# Patient Record
Sex: Female | Born: 1965
Health system: Southern US, Community
[De-identification: ages and names within clinical notes are randomized; demographics above are authoritative.]

## PROBLEM LIST (undated history)

## (undated) DIAGNOSIS — F909 Attention-deficit hyperactivity disorder, unspecified type: Secondary | ICD-10-CM

## (undated) DIAGNOSIS — R079 Chest pain, unspecified: Secondary | ICD-10-CM

## (undated) DIAGNOSIS — I10 Essential (primary) hypertension: Secondary | ICD-10-CM

## (undated) DIAGNOSIS — D693 Immune thrombocytopenic purpura: Secondary | ICD-10-CM

## (undated) DIAGNOSIS — M79673 Pain in unspecified foot: Secondary | ICD-10-CM

## (undated) DIAGNOSIS — R9082 White matter disease, unspecified: Secondary | ICD-10-CM

## (undated) DIAGNOSIS — R41840 Attention and concentration deficit: Secondary | ICD-10-CM

## (undated) DIAGNOSIS — F09 Unspecified mental disorder due to known physiological condition: Secondary | ICD-10-CM

## (undated) DIAGNOSIS — J189 Pneumonia, unspecified organism: Secondary | ICD-10-CM

## (undated) DIAGNOSIS — R519 Headache, unspecified: Secondary | ICD-10-CM

## (undated) DIAGNOSIS — Z13220 Encounter for screening for lipoid disorders: Secondary | ICD-10-CM

## (undated) DIAGNOSIS — M1990 Unspecified osteoarthritis, unspecified site: Secondary | ICD-10-CM

## (undated) DIAGNOSIS — M21619 Bunion of unspecified foot: Secondary | ICD-10-CM

## (undated) DIAGNOSIS — H532 Diplopia: Secondary | ICD-10-CM

## (undated) DIAGNOSIS — G35 Multiple sclerosis: Secondary | ICD-10-CM

## (undated) DIAGNOSIS — S329XXA Fracture of unspecified parts of lumbosacral spine and pelvis, initial encounter for closed fracture: Secondary | ICD-10-CM

## (undated) DIAGNOSIS — E0500 Thyrotoxicosis with diffuse goiter without thyrotoxic crisis or storm: Secondary | ICD-10-CM

## (undated) DIAGNOSIS — R51 Headache: Secondary | ICD-10-CM

## (undated) DIAGNOSIS — R413 Other amnesia: Secondary | ICD-10-CM

## (undated) DIAGNOSIS — R260 Ataxic gait: Secondary | ICD-10-CM

## (undated) DIAGNOSIS — D759 Disease of blood and blood-forming organs, unspecified: Secondary | ICD-10-CM

## (undated) DIAGNOSIS — D696 Thrombocytopenia, unspecified: Secondary | ICD-10-CM

## (undated) DIAGNOSIS — E039 Hypothyroidism, unspecified: Secondary | ICD-10-CM

## (undated) DIAGNOSIS — R200 Anesthesia of skin: Secondary | ICD-10-CM

## (undated) HISTORY — PX: FOOT SURGERY: SHX648

## (undated) HISTORY — PX: OTHER SURGICAL HISTORY: SHX169

## (undated) HISTORY — PX: WISDOM TOOTH EXTRACTION: SHX21

## (undated) HISTORY — DX: Pain in unspecified foot: M79.673

## (undated) HISTORY — DX: Bunion of unspecified foot: M21.619

## (undated) HISTORY — DX: Essential (primary) hypertension: I10

## (undated) HISTORY — DX: Thyrotoxicosis with diffuse goiter without thyrotoxic crisis or storm: E05.00

---
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
## 2011-08-23 ENCOUNTER — Emergency Department (HOSPITAL_COMMUNITY): Payer: Worker's Compensation

## 2011-08-23 ENCOUNTER — Ambulatory Visit (HOSPITAL_COMMUNITY)
Admission: EM | Admit: 2011-08-23 | Discharge: 2011-08-25 | DRG: 536 | Disposition: A | Discharge status: Home Health | Payer: Worker's Compensation | Attending: Orthopedic Surgery | Admitting: Orthopedic Surgery

## 2011-08-23 DIAGNOSIS — W010XXA Fall on same level from slipping, tripping and stumbling without subsequent striking against object, initial encounter: Secondary | ICD-10-CM | POA: Insufficient documentation

## 2011-08-23 DIAGNOSIS — Y92009 Unspecified place in unspecified non-institutional (private) residence as the place of occurrence of the external cause: Secondary | ICD-10-CM | POA: Insufficient documentation

## 2011-08-23 DIAGNOSIS — Y998 Other external cause status: Secondary | ICD-10-CM | POA: Insufficient documentation

## 2011-08-23 DIAGNOSIS — M25559 Pain in unspecified hip: Secondary | ICD-10-CM | POA: Insufficient documentation

## 2011-08-23 DIAGNOSIS — S32409A Unspecified fracture of unspecified acetabulum, initial encounter for closed fracture: Secondary | ICD-10-CM | POA: Insufficient documentation

## 2011-08-23 LAB — POCT PREGNANCY, URINE: Preg Test, Ur: NEGATIVE

## 2011-08-29 NOTE — Discharge Summary (Signed)
  NAME:  Ashley Key, Ashley Key.                ACCOUNT NO.:  [id number]  MEDICAL RECORD NO.:  [id number]  LOCATION:  5031                         FACILITY:  [facility]  PHYSICIAN:  Nadara Mustard, MD     DATE OF BIRTH:  [birth year]  DATE OF ADMISSION:  08/23/2011 DATE OF DISCHARGE:  08/25/2011                              DISCHARGE SUMMARY   FINAL DIAGNOSIS:  Left acetabular fracture.  Discharged to home in stable condition.  DISCHARGE MEDICATIONS:  Include her admission medications as per the medical reconciliation form plus Percocet for pain 1-2 p.o. q.4 h. p.r.n. for pain, prescription provided.  The patient will also take 1 baby aspirin a day for 4 weeks for DVT prophylaxis.  Physical therapy with [facility] as well as DME including shower, chair, and walker.  Follow up in the office in 1 week.  Discharged to home in stable condition.     Nadara Mustard, MD     MVD/MEDQ  D:  08/25/2011  T:  08/25/2011  Job:  [id number]  Electronically Signed by Aldean Baker MD on 08/29/2011 06:23:32 AM

## 2013-09-02 ENCOUNTER — Encounter: Payer: Self-pay | Admitting: *Deleted

## 2013-09-10 ENCOUNTER — Ambulatory Visit (INDEPENDENT_AMBULATORY_CARE_PROVIDER_SITE_OTHER): Payer: BC Managed Care – PPO

## 2013-09-10 VITALS — BP 144/94 | HR 54 | Temp 98.0°F | Resp 12 | Ht 66.0 in | Wt 132.0 lb

## 2013-09-10 DIAGNOSIS — Z9889 Other specified postprocedural states: Secondary | ICD-10-CM

## 2013-09-10 DIAGNOSIS — M2450 Contracture, unspecified joint: Secondary | ICD-10-CM

## 2013-09-10 NOTE — Progress Notes (Signed)
  Subjective:    Patient ID: Ashley Key. [place], female    DOB: 06-14-1966, 47 y.o.   MRN: [id number]  HPI patient presents for 3 month postop visit status post [facility] bunionectomy right foot as well as tailor bunionectomy with screw fixation left foot continues to have some slight dorsal contracture at the MTP level has excellent dorsiflexion range of motion however limited plantar flexion is noted. X-rays taken at this time revealed good consolidation of the osteotomy and intact screw fixations with no displacements. Both clinically and radiographically there is still some mild postoperative edema consistent with postop course    Review of Systems  Constitutional: Negative.   HENT: Negative.   Respiratory: Negative.   Cardiovascular: Negative.   Gastrointestinal: Negative.   Genitourinary: Negative.   Allergic/Immunologic: Negative.   Neurological: Negative.   Hematological: Negative.   Psychiatric/Behavioral: Negative.        Objective:   Physical Exam  Constitutional: She is oriented to person, place, and time. She appears well-developed and well-nourished.  Cardiovascular: Intact distal pulses.   Pulses:      Dorsalis pedis pulses are 2+ on the right side, and 2+ on the left side.       Posterior tibial pulses are 2+ on the right side, and 2+ on the left side.  Capillary refill timed 3-4 seconds all digits skin texture and turgor normal. Mild edema to the operating foot in the area of first and fifth metatarsals consistent with postop course.  Musculoskeletal:       Left foot: Normal.  Patient continues to have limited motion at the first MTP joint, excellent dorsiflexion is noted however there is rigid contracture and plantar flexions attempt. Minimal toe purchase is noted. Incisions are well coapted with mild scar contracture being noted X-rays confirm good alignment of the osteotomies and intact fixations. Mild asymmetric joint space narrowing of the first MTP joint is still present,  however clinically movement has greatly improved.  Neurological: She is alert and oriented to person, place, and time. She has normal reflexes.  Skin: Skin is warm and dry.  Skin texture and turgor unremarkable well-healed incision scars noted  Psychiatric: She has a normal mood and affect. Her behavior is normal. Judgment and thought content normal.          Assessment & Plan:  Good postop progress patient continues to have some mild contracture at the MTP joint right. Would benefit from continued active and passive range of motion exercises, also dispensed a Darco toe splint to help plantar flex the right great toe. Maintain and steadily increase activities, followup in 3 months for reevaluation.  Alvan Dame DPM

## 2013-09-10 NOTE — Patient Instructions (Signed)
Refer to handout for postop bunion exercises Stress plantar flexion or and down movement of the great toe joint maintained exercises for the next 3 months. Maintain Darco toe splint while sleeping to help improve range of motion of grade 2 joint

## 2013-12-14 ENCOUNTER — Ambulatory Visit (INDEPENDENT_AMBULATORY_CARE_PROVIDER_SITE_OTHER): Payer: BC Managed Care – PPO

## 2013-12-14 VITALS — BP 130/84 | HR 80 | Resp 12

## 2013-12-14 DIAGNOSIS — R52 Pain, unspecified: Secondary | ICD-10-CM

## 2013-12-14 DIAGNOSIS — M2450 Contracture, unspecified joint: Secondary | ICD-10-CM

## 2013-12-14 DIAGNOSIS — Z9889 Other specified postprocedural states: Secondary | ICD-10-CM

## 2013-12-14 NOTE — Progress Notes (Signed)
   Subjective:    Patient ID: Ashley Key, female    DOB: 1966/08/15, 48 y.o.   MRN: [id number]  HPI Comments: '' RT FOOT STILL SORE WHEN BENDING IT DOWN.''     Review of Systems no new change     Objective:   Physical Exam Neurovascular status is intact incision is well coapted. X-rays revealed alignment of the fixations and osteotomies which are healed well. There is still since reduced range of motion plantar flexion at the hallux MTP joint. Advised to continue with active passive range of motion exercises and stretching exercises. Continue with vitamin E or lotion to the incision scars which are well healed at this time the x-rays reveal good consolidation of osteotomies no displacement of fixation       Assessment & Plan:  Good postop progress. Patient is status post [facility] [place] as will tailor bunionectomy right foot incision is well coapted bone as well healed or displacements noted continue with active and passive range of motion exercises. Discharge to an as-needed basis for followup  Harriet Masson DPM

## 2013-12-14 NOTE — Patient Instructions (Signed)
Continued active range of motion exercises of the great toe joint in particular: The toe down as much as possible maintain lotion to the scar area as needed next  Discharge to an as-needed basis for followup

## 2014-01-17 ENCOUNTER — Other Ambulatory Visit (HOSPITAL_COMMUNITY)
Admission: RE | Admit: 2014-01-17 | Discharge: 2014-01-17 | Disposition: A | Discharge status: Home / Self Care | Payer: BC Managed Care – PPO | Source: Ambulatory Visit | Attending: Obstetrics and Gynecology | Admitting: Obstetrics and Gynecology

## 2014-01-17 ENCOUNTER — Other Ambulatory Visit: Payer: Self-pay | Admitting: Obstetrics and Gynecology

## 2014-01-17 DIAGNOSIS — Z1151 Encounter for screening for human papillomavirus (HPV): Secondary | ICD-10-CM | POA: Insufficient documentation

## 2014-01-17 DIAGNOSIS — Z01419 Encounter for gynecological examination (general) (routine) without abnormal findings: Secondary | ICD-10-CM | POA: Insufficient documentation

## 2014-06-27 ENCOUNTER — Encounter (HOSPITAL_COMMUNITY): Payer: Self-pay | Admitting: Emergency Medicine

## 2014-06-27 ENCOUNTER — Inpatient Hospital Stay (HOSPITAL_COMMUNITY)
Admission: EM | Admit: 2014-06-27 | Discharge: 2014-06-29 | DRG: 813 | Disposition: A | Discharge status: Home / Self Care | Payer: BC Managed Care – PPO | Attending: Internal Medicine | Admitting: Internal Medicine

## 2014-06-27 DIAGNOSIS — D693 Immune thrombocytopenic purpura: Secondary | ICD-10-CM

## 2014-06-27 DIAGNOSIS — R946 Abnormal results of thyroid function studies: Secondary | ICD-10-CM | POA: Diagnosis present

## 2014-06-27 DIAGNOSIS — F909 Attention-deficit hyperactivity disorder, unspecified type: Secondary | ICD-10-CM | POA: Diagnosis present

## 2014-06-27 DIAGNOSIS — Z823 Family history of stroke: Secondary | ICD-10-CM

## 2014-06-27 DIAGNOSIS — F341 Dysthymic disorder: Secondary | ICD-10-CM | POA: Diagnosis present

## 2014-06-27 DIAGNOSIS — D709 Neutropenia, unspecified: Secondary | ICD-10-CM

## 2014-06-27 DIAGNOSIS — D72819 Decreased white blood cell count, unspecified: Secondary | ICD-10-CM

## 2014-06-27 DIAGNOSIS — IMO0002 Reserved for concepts with insufficient information to code with codable children: Secondary | ICD-10-CM

## 2014-06-27 DIAGNOSIS — N920 Excessive and frequent menstruation with regular cycle: Secondary | ICD-10-CM

## 2014-06-27 DIAGNOSIS — R599 Enlarged lymph nodes, unspecified: Secondary | ICD-10-CM | POA: Diagnosis present

## 2014-06-27 DIAGNOSIS — Z79899 Other long term (current) drug therapy: Secondary | ICD-10-CM

## 2014-06-27 DIAGNOSIS — D696 Thrombocytopenia, unspecified: Secondary | ICD-10-CM | POA: Diagnosis present

## 2014-06-27 DIAGNOSIS — Z8249 Family history of ischemic heart disease and other diseases of the circulatory system: Secondary | ICD-10-CM

## 2014-06-27 DIAGNOSIS — N959 Unspecified menopausal and perimenopausal disorder: Secondary | ICD-10-CM | POA: Diagnosis present

## 2014-06-27 HISTORY — DX: Thrombocytopenia, unspecified: D69.6

## 2014-06-27 HISTORY — DX: Immune thrombocytopenic purpura: D69.3

## 2014-06-27 HISTORY — DX: Attention-deficit hyperactivity disorder, unspecified type: F90.9

## 2014-06-27 LAB — CBC WITH DIFFERENTIAL/PLATELET
Basophils Absolute: 0 10*3/uL (ref 0.0–0.1)
Basophils Relative: 1 % (ref 0–1)
Eosinophils Absolute: 0.1 10*3/uL (ref 0.0–0.7)
Eosinophils Relative: 3 % (ref 0–5)
HCT: 38.7 % (ref 36.0–46.0)
Hemoglobin: 13.1 g/dL (ref 12.0–15.0)
LYMPHS ABS: 1.2 10*3/uL (ref 0.7–4.0)
LYMPHS PCT: 38 % (ref 12–46)
MCH: 30.3 pg (ref 26.0–34.0)
MCHC: 33.9 g/dL (ref 30.0–36.0)
MCV: 89.4 fL (ref 78.0–100.0)
Monocytes Absolute: 0.4 10*3/uL (ref 0.1–1.0)
Monocytes Relative: 14 % — ABNORMAL HIGH (ref 3–12)
NEUTROS ABS: 1.4 10*3/uL — AB (ref 1.7–7.7)
NEUTROS PCT: 45 % (ref 43–77)
PLATELETS: 28 10*3/uL — AB (ref 150–400)
RBC: 4.33 MIL/uL (ref 3.87–5.11)
RDW: 11.9 % (ref 11.5–15.5)
WBC: 3.1 10*3/uL — AB (ref 4.0–10.5)

## 2014-06-27 LAB — COMPREHENSIVE METABOLIC PANEL
ALK PHOS: 45 U/L (ref 39–117)
ALT: 8 U/L (ref 0–35)
AST: 17 U/L (ref 0–37)
Albumin: 3.8 g/dL (ref 3.5–5.2)
Anion gap: 12 (ref 5–15)
BUN: 17 mg/dL (ref 6–23)
CHLORIDE: 104 meq/L (ref 96–112)
CO2: 25 meq/L (ref 19–32)
Calcium: 8.5 mg/dL (ref 8.4–10.5)
Creatinine, Ser: 0.71 mg/dL (ref 0.50–1.10)
GLUCOSE: 86 mg/dL (ref 70–99)
POTASSIUM: 4.4 meq/L (ref 3.7–5.3)
SODIUM: 141 meq/L (ref 137–147)
Total Bilirubin: <0.2 mg/dL — ABNORMAL LOW (ref 0.3–1.2)
Total Protein: 6.9 g/dL (ref 6.0–8.3)

## 2014-06-27 LAB — SAVE SMEAR

## 2014-06-27 LAB — APTT: aPTT: 27 seconds (ref 24–37)

## 2014-06-27 LAB — PROTIME-INR
INR: 1.1 (ref 0.00–1.49)
Prothrombin Time: 14.2 seconds (ref 11.6–15.2)

## 2014-06-27 MED ORDER — SODIUM CHLORIDE 0.9 % IV SOLN
INTRAVENOUS | Status: DC
  Administered 2014-06-28: 01:00:00 via INTRAVENOUS

## 2014-06-27 MED ORDER — ONDANSETRON HCL 4 MG PO TABS: 4.0000 mg | ORAL_TABLET | Freq: Four times a day (QID) | ORAL | Status: DC | PRN

## 2014-06-27 MED ORDER — ACETAMINOPHEN 650 MG RE SUPP: 650.0000 mg | Freq: Four times a day (QID) | RECTAL | Status: DC | PRN

## 2014-06-27 MED ORDER — OMEGA-3-ACID ETHYL ESTERS 1 G PO CAPS
1000.0000 mg | ORAL_CAPSULE | Freq: Every day | ORAL | Status: DC
  Administered 2014-06-28 – 2014-06-29 (×3): 1000 mg via ORAL
  Filled 2014-06-27 (×5): qty 1

## 2014-06-27 MED ORDER — METHYLPREDNISOLONE SODIUM SUCC 125 MG IJ SOLR
60.0000 mg | INTRAMUSCULAR | Status: DC
  Administered 2014-06-28: 60 mg via INTRAVENOUS
  Filled 2014-06-27 (×2): qty 0.96
  Filled 2014-06-27: qty 2

## 2014-06-27 MED ORDER — AMPHETAMINE-DEXTROAMPHET ER 5 MG PO CP24
20.0000 mg | ORAL_CAPSULE | Freq: Every day | ORAL | Status: DC
  Administered 2014-06-28 – 2014-06-29 (×2): 20 mg via ORAL
  Filled 2014-06-27 (×2): qty 4

## 2014-06-27 MED ORDER — ACETAMINOPHEN 325 MG PO TABS: 650.0000 mg | ORAL_TABLET | Freq: Four times a day (QID) | ORAL | Status: DC | PRN

## 2014-06-27 MED ORDER — VITAMIN B-12 1000 MCG PO TABS
1000.0000 ug | ORAL_TABLET | Freq: Every day | ORAL | Status: DC
  Administered 2014-06-28: 1000 ug via ORAL
  Filled 2014-06-27 (×4): qty 1

## 2014-06-27 MED ORDER — SERTRALINE HCL 25 MG PO TABS
25.0000 mg | ORAL_TABLET | Freq: Every day | ORAL | Status: DC
  Administered 2014-06-28 (×2): 25 mg via ORAL
  Filled 2014-06-27 (×3): qty 1

## 2014-06-27 MED ORDER — ONDANSETRON HCL 4 MG/2ML IJ SOLN: 4.0000 mg | Freq: Four times a day (QID) | INTRAMUSCULAR | Status: DC | PRN

## 2014-06-27 NOTE — H&P (Signed)
[facility] History and Physical  Ashley. Valere Key DXI:[id number] DOB: 1966-01-05 DOA: 06/27/2014  Referring physician: ER physician. PCP: London Pepper, MD   Chief Complaint: Low platelets.  HPI: Ashley Key is a 48 y.o. female with history of ADHD was recently placed on estrogen patch 2 months ago for menopausal symptoms was found having increasing vaginal bleeding and was placed on progesterone. 2 weeks ago patient was noticed to have increasing bruising over the lower extremities. Patient was referred to her primary care by the gynecologist and blood count were ordered today and her platelet count was found to be 12,000 and patient was advised to come to the ER. In the ER on call oncologist Dr. Learta Codding was consulted and patient has been admitted for further management. Patient denies any fever chills or any insect bites. Other than the estrogen and progesterone no other new medications were started. In the ER patient was afebrile. Patient denies any epistaxis or rectal bleeding.   Review of Systems: As presented in the history of presenting illness, rest negative.  Past Medical History  Diagnosis Date  . Bunion   . Arch pain   . ADHD (attention deficit hyperactivity disorder)    Past Surgical History  Procedure Laterality Date  . Foot surgery     Social History:  reports that she has never smoked. She does not have any smokeless tobacco history on file. She reports that she drinks alcohol. She reports that she does not use illicit drugs. Where does patient live home. Can patient participate in ADLs? Yes.  No Known Allergies  Family History:  Family History  Problem Relation Age of Onset  . CAD Mother   . Stroke Father   . CAD Brother       Prior to Admission medications   Medication Sig Start Date End Date Taking? Authorizing Provider  amphetamine-dextroamphetamine (ADDERALL XR) 20 MG 24 hr capsule Take 20 mg by mouth daily.   Yes Historical Provider, MD   CALCIUM-VITAMIN D PO Take 1 tablet by mouth 2 (two) times daily.   Yes Historical Provider, MD  estradiol-norethindrone [facility]) 0.05-0.14 MG/DAY Place 1 patch onto the skin 2 (two) times a week. *changes on Wednesday and Saturday*   Yes Historical Provider, MD  Lactobacillus (ACIDOPHILUS PO) Take 1 tablet by mouth daily.   Yes Historical Provider, MD  Multiple Vitamins-Minerals (MULTIVITAMIN PO) Take 1 tablet by mouth daily.   Yes Historical Provider, MD  Omega-3 Fatty Acids (FISH OIL) 1200 MG CAPS Take 1,200 mg by mouth daily.   Yes Historical Provider, MD  sertraline (ZOLOFT) 25 MG tablet Take 25 mg by mouth at bedtime.   Yes Historical Provider, MD  vitamin B-12 (CYANOCOBALAMIN) 1000 MCG tablet Take 1,000 mcg by mouth daily.   Yes Historical Provider, MD    Physical Exam: Filed Vitals:   06/27/14 2015 06/27/14 2113 06/27/14 2115 06/27/14 2200  BP: 135/95  127/79 137/93  Pulse: 77 67 62 66  Temp:      TempSrc:      Resp:  20 16   Height:      Weight:      SpO2: 100%  100% 100%     General:  Moderately built and nourished.  Eyes: Anicteric no pallor.  ENT: No discharge from the ears eyes nose mouth.  Neck: No neck rigidity. No obvious mass.  Cardiovascular: S1-S2 heard.  Respiratory: No rhonchi or crepitations.  Abdomen: Soft nontender bowel sounds present.  Skin: Multiple petechial lesions and bruising mostly on  the lower extremities.  Musculoskeletal: No edema.  Psychiatric: Appears normal.  Neurologic: Alert awake oriented to time place and person. Moves all extremities.  Labs on Admission:  Basic Metabolic Panel:  Recent Labs Lab 06/27/14 1951  NA 141  K 4.4  CL 104  CO2 25  GLUCOSE 86  BUN 17  CREATININE 0.71  CALCIUM 8.5   Liver Function Tests:  Recent Labs Lab 06/27/14 1951  AST 17  ALT 8  ALKPHOS 45  BILITOT <0.2*  PROT 6.9  ALBUMIN 3.8   No results found for this basename: LIPASE, AMYLASE,  in the last 168 hours No results  found for this basename: AMMONIA,  in the last 168 hours CBC:  Recent Labs Lab 06/27/14 1951  WBC 3.1*  NEUTROABS 1.4*  HGB 13.1  HCT 38.7  MCV 89.4  PLT 28*   Cardiac Enzymes: No results found for this basename: CKTOTAL, CKMB, CKMBINDEX, TROPONINI,  in the last 168 hours  BNP (last 3 results) No results found for this basename: PROBNP,  in the last 8760 hours CBG: No results found for this basename: GLUCAP,  in the last 168 hours  Radiological Exams on Admission: No results found.   Assessment/Plan Active Problems:   ITP (idiopathic thrombocytopenic purpura)   Thrombocytopenia   1. Thrombocytopenia - at this time I have discussed with her oncologist Dr. Learta Codding. The main differential is concern for idiopathic thrombocytopenic purpura as per oncologist. Patient has been started on IV steroids. LDH, ANA , HIV and rheumatoid factor has been ordered. Closely follow CBC. Patient also has leukopenia but presently afebrile. 2. Vaginal bleeding - probably from #1. 3. ADHD - continue present medications.    Code Status: Full code.  Family Communication: None.  Disposition Plan:  Admit to inpatient.    Shailen Thielen N. [facility] Pager [phone].  If 7PM-7AM, please contact night-coverage [email or web address] Password [facility] 06/27/2014, 10:22 PM

## 2014-06-27 NOTE — ED Notes (Signed)
CRITICAL VALUE ALERT  Critical value received:  Platelets 28   Date of notification:  06/27/2014  Time of notification:  1944  Critical value read back: Yes   Nurse who received alert:  Elyn Peers   MD notified : Hazel Sams PA

## 2014-06-27 NOTE — ED Notes (Signed)
Attempted report x1 . Hematology at bedside.

## 2014-06-27 NOTE — Consult Note (Signed)
New Hematology/Oncology Consult   Referral MW:[id number]        Reason for Referral: Thrombocytopenia   Chief Complaint  Patient presents with  . Bleeding/Bruising  :  HPI: She reports in remission of pulse approximately 2 years ago. 2 months ago she developed vaginal bleeding that lasted for several weeks. She saw her gynecologist and was placed on an estrogen patch. The bleeding persisted and she returned to the gynecologist. The vaginal bleeding improved when a progesterone was added. However approximately 2 weeks ago she noted the onset of spontaneous bruising. No other bleeding. She was referred to her primary physician and a platelet count returned at 12,000 today. She was referred to the [facility] emergency room for further evaluation. No new medications aside from the estrogen patch and progesterone. No recent infection.     Past Medical History  Diagnosis Date  . Bunion   . Arch pain   . ADHD (attention deficit hyperactivity disorder)   : Sondra Come P0   Past Surgical History  Procedure Laterality Date  .  bunionectomy   right   2014  :    :  No Known Allergies:  Family History  Problem Relation Age of Onset  . CAD Mother   . Stroke Father   . CAD Brother   : .   Prostate cancer                                                  brother    .   "Evans syndrome "                                             nephew   History   Social History  . Marital Status: Married    Spouse Name: N/A    Number of Children: N/A  . Years of Education: N/A   Occupational History  .  she is a Building surveyor    Social History Main Topics  . Smoking status: Never Smoker   . Smokeless tobacco: Not on file  . Alcohol Use: Yes-approximately one glass of wine per week   . Drug Use: No  . Sexual Activity: Not on file   She lives with her husband and son. She has not used tobacco .she drinks approximately one glass of wine per week. No risk factor for HIV or hepatitis.   Review  of Systems:  Positives include: Pain at the right heel, easy bruising, vaginal bleeding improved with progesterone, chronic arthralgias, vaginal "spotting "today  A complete ROS was otherwise negative.   Physical Exam:  Blood pressure 137/93, pulse 66, temperature 97.9 F (36.6 C), temperature source Oral, resp. rate 16, height 5\' 6"  (1.676 m), weight 139 lb 4.8 oz (63.186 kg), SpO2 100.00%.  HEENT: Oropharynx without thrush or ulcers. Single petechiae at the left soft palate, neck without mass Lungs: Clear bilaterally Cardiac: Regular rate and rhythm Abdomen: Nontender, no hepatosplenomegaly  Vascular: No leg edema Lymph nodes: "Shotty "bilateral posterior cervical, scalene, axillary, and left inguinal nodes Neurologic:  Alert an in the upper and lower extremitiesd oriented, the motor exam appears grossly intact Skin:  small ecchymoses over the extremities, largest at the left upper thigh. Petechiae at the lower  legs. Acne-type lesions over the face Musculoskeletal: **  LABS:   Recent Labs  06/27/14 1951  WBC 3.1*  HGB 13.1  HCT 38.7  PLT 28*   ANC 1.4, absolute site 1.2   Peripheral blood smear: The platelets are decreased in number, most of the platelets are small, no platelet clumps. A few ovalocytes and teardrops. The polychromasia is not increased. The white cell morphology is unremarkable. No monotonous population, no blasts or other young forms are present    Recent Labs  06/27/14 1951  NA 141  K 4.4  CL 104  CO2 25  GLUCOSE 86  BUN 17  CREATININE 0.71  CALCIUM 8.5   PT 14.2, PTT 27      Assessment and Plan:   1. Severe thrombocytopenia 2. bruising secondary to #1 3. vaginal bleeding secondary to #1 4. Shotty lymphadenopathy-likely benign 5. history of ADHD  6. mild leukopenia-neutropenia 7. G0 P0  Ms. Jantz presents with severe thrombocytopenia. There is no evidence for an acute systemic infection, collagen vascular disease, or malignancy. I  suspect the mild neutropenia is related to an autoimmune condition or a benign normal variant.  She most likely has ITP. She is symptomatic with easy bruising and menorrhagia. I recommend beginning systemic steroids. I discussed the risk/benefits of steroid therapy including potential toxicities. She agrees to proceed.   I also recommend testing for associated condition such as SLE, HIV, and rheumatoid arthritis.  Recommendations: 1. Solu-Medrol 2. Daily CBC 3. Check SLE, HIV, rheumatoid factor, and LDH  Hematology will follow her with the medicine service while in the hospital and then as an outpatient.    Betsy Coder, MD 06/27/2014, 10:42 PM

## 2014-06-27 NOTE — ED Provider Notes (Signed)
Medical screening examination/treatment/procedure(s) were performed by non-physician practitioner and as supervising physician I was immediately available for consultation/collaboration.    Dot Lanes, MD 06/27/14 2153

## 2014-06-27 NOTE — ED Notes (Signed)
PT reports that she has noticed increasing bruising to bilateral legs x 2 weeks. Pt also states she started having vaginal bleeding x 2 days. States she thought she was postmenopausal since she hasn't had a cycle in months. Pt denies any pain, but does report some weakness. Denies dizziness or lightheadedness. Pt has petechiae to bilateral legs and hematoma to left leg. Pt has bruises in throughout body in various stages of healing.

## 2014-06-27 NOTE — ED Provider Notes (Signed)
CSN: [id number]     Arrival date & time 06/27/14  1818 History   First MD Initiated Contact with Patient 06/27/14 2004     Chief Complaint  Patient presents with  . Bleeding/Bruising   HPI  History provided by the patient. Patient is a 48 year old female with no significant PMH presenting with concerns for easy bruising, rash of the skin and low platelet count. Patient reports having bruises and rash to her skin for the past 2 weeks. She initially noticed this during an OB/GYN visit. She had been having issues with heavy menstrual bleeding related to her menopause. She was put on estrogen patch as well as some progesterone to help with this. She has continued to have some problems of vaginal bleeding since then. On followup with her primary care provider she had a sick blood testing to evaluate her bruising and red spots on the legs. She was called and notified today that her platelet count was 12 and to come straight to the emergency room. Patient otherwise reports feeling well without any recent illness. No fever, chills or sweats. Denies any headache, confusion, weakness or numbness in extremities. No speech changes. Patient does mention that she has a nephew from her sister was recently diagnosed with "Evans syndrome" which caused low platelet levels. He is being evaluated in [place] for this condition.    Past Medical History  Diagnosis Date  . Bunion   . Arch pain    No past surgical history on file. No family history on file. History  Substance Use Topics  . Smoking status: Never Smoker   . Smokeless tobacco: Not on file  . Alcohol Use: Yes   OB History   Grav Para Term Preterm Abortions TAB SAB Ect Mult Living                 Review of Systems  Constitutional: Positive for fatigue. Negative for fever, chills and diaphoresis.  Respiratory: Negative for shortness of breath.   Cardiovascular: Negative for chest pain.  Gastrointestinal: Negative for abdominal pain.   Genitourinary: Positive for vaginal bleeding. Negative for dysuria, frequency, hematuria, flank pain and pelvic pain.  Neurological: Negative for dizziness, speech difficulty, weakness, light-headedness, numbness and headaches.  Psychiatric/Behavioral: Negative for confusion.  All other systems reviewed and are negative.     Allergies  Review of patient's allergies indicates no known allergies.  Home Medications   Prior to Admission medications   Medication Sig Start Date End Date Taking? Authorizing Provider  amphetamine-dextroamphetamine (ADDERALL XR) 20 MG 24 hr capsule Take 20 mg by mouth daily.   Yes Historical Provider, MD  CALCIUM-VITAMIN D PO Take 1 tablet by mouth 2 (two) times daily.   Yes Historical Provider, MD  estradiol-norethindrone [facility]) 0.05-0.14 MG/DAY Place 1 patch onto the skin 2 (two) times a week. *changes on Wednesday and Saturday*   Yes Historical Provider, MD  Lactobacillus (ACIDOPHILUS PO) Take 1 tablet by mouth daily.   Yes Historical Provider, MD  Multiple Vitamins-Minerals (MULTIVITAMIN PO) Take 1 tablet by mouth daily.   Yes Historical Provider, MD  Omega-3 Fatty Acids (FISH OIL) 1200 MG CAPS Take 1,200 mg by mouth daily.   Yes Historical Provider, MD  sertraline (ZOLOFT) 25 MG tablet Take 25 mg by mouth at bedtime.   Yes Historical Provider, MD  vitamin B-12 (CYANOCOBALAMIN) 1000 MCG tablet Take 1,000 mcg by mouth daily.   Yes Historical Provider, MD   BP 137/92  Pulse 66  Temp(Src) 97.9 F (36.6 C) (  Oral)  Resp 17  Ht 5\' 6"  (1.676 m)  Wt 139 lb 4.8 oz (63.186 kg)  BMI 22.49 kg/m2  SpO2 100% Physical Exam  Nursing note and vitals reviewed. Constitutional: She is oriented to person, place, and time. She appears well-developed and well-nourished. No distress.  HENT:  Head: Normocephalic and atraumatic.  Right Ear: Tympanic membrane normal.  Left Ear: Tympanic membrane normal.  Single petechial lesion to the left soft palate.  Neck: Normal  range of motion.  Cardiovascular: Normal rate and regular rhythm.   Pulmonary/Chest: Effort normal and breath sounds normal. No respiratory distress. She has no wheezes. She has no rales.  Abdominal: Soft. There is no tenderness. There is no rebound and no guarding.  Neurological: She is alert and oriented to person, place, and time. She has normal strength. No cranial nerve deficit or sensory deficit.  Skin: Skin is warm and dry.  Multiple petechiae of the lower extremities. They're also several somewhat large bruises to the thighs. Single petechial lesion to the oropharynx.  Psychiatric: She has a normal mood and affect. Her behavior is normal.    ED Course  Procedures   COORDINATION OF CARE:  Nursing notes reviewed. Vital signs reviewed. Initial pt interview and examination performed.   Filed Vitals:   06/27/14 1827 06/27/14 2012  BP: 143/78 137/92  Pulse: 101 66  Temp: 97.9 F (36.6 C)   TempSrc: Oral   Resp: 16 17  Height: 5\' 6"  (1.676 m)   Weight: 139 lb 4.8 oz (63.186 kg)   SpO2: 97% 100%    8:10 PM patient seen and evaluated. Patient resting appears comfortable no acute distress. She is afebrile at triage. Denies any recent fevers. No confusion. Nonfocal neuro exam. Petechiae and multiple bruising of skin.  8:55 PM spoke with Dr. Hal Hope with [facility]. He will see pt would also like hematology consult.  9:05PM spoke with Dr. Benay Spice on call for hem/onc he will see pt in consult. Would like a blood smear ordered.   Treatment plan initiated:Medications - No data to display  Results for orders placed during the hospital encounter of 06/27/14  CBC WITH DIFFERENTIAL      Result Value Ref Range   WBC 3.1 (*) 4.0 - 10.5 K/uL   RBC 4.33  3.87 - 5.11 MIL/uL   Hemoglobin 13.1  12.0 - 15.0 g/dL   HCT 38.7  36.0 - 46.0 %   MCV 89.4  78.0 - 100.0 fL   MCH 30.3  26.0 - 34.0 pg   MCHC 33.9  30.0 - 36.0 g/dL   RDW 11.9  11.5 - 15.5 %   Platelets PENDING  150 - 400 K/uL    Neutrophils Relative % 45  43 - 77 %   Neutro Abs 1.4 (*) 1.7 - 7.7 K/uL   Lymphocytes Relative 38  12 - 46 %   Lymphs Abs 1.2  0.7 - 4.0 K/uL   Monocytes Relative 14 (*) 3 - 12 %   Monocytes Absolute 0.4  0.1 - 1.0 K/uL   Eosinophils Relative 3  0 - 5 %   Eosinophils Absolute 0.1  0.0 - 0.7 K/uL   Basophils Relative 1  0 - 1 %   Basophils Absolute 0.0  0.0 - 0.1 K/uL  COMPREHENSIVE METABOLIC PANEL      Result Value Ref Range   Sodium 141  137 - 147 mEq/L   Potassium 4.4  3.7 - 5.3 mEq/L   Chloride 104  96 - 112  mEq/L   CO2 25  19 - 32 mEq/L   Glucose, Bld 86  70 - 99 mg/dL   BUN 17  6 - 23 mg/dL   Creatinine, Ser 0.71  0.50 - 1.10 mg/dL   Calcium 8.5  8.4 - 10.5 mg/dL   Total Protein 6.9  6.0 - 8.3 g/dL   Albumin 3.8  3.5 - 5.2 g/dL   AST 17  0 - 37 U/L   ALT 8  0 - 35 U/L   Alkaline Phosphatase 45  39 - 117 U/L   Total Bilirubin <0.2 (*) 0.3 - 1.2 mg/dL   GFR calc non Af Amer >90  >90 mL/min   GFR calc Af Amer >90  >90 mL/min   Anion gap 12  5 - 15  PROTIME-INR      Result Value Ref Range   Prothrombin Time 14.2  11.6 - 15.2 seconds   INR 1.10  0.00 - 1.49     MDM   Final diagnoses:  ITP (idiopathic thrombocytopenic purpura)       Martie Lee, PA-C 06/27/14 2122

## 2014-06-27 NOTE — ED Notes (Signed)
Pt to ED c/o bruising x 2 weeks.  Reports abnormal bruises appearing "in random spots on my body." Had recent blood work drawn this morning, was called and told "platelets are 12" and to come to ED for further eval.

## 2014-06-28 LAB — CBC WITH DIFFERENTIAL/PLATELET
Basophils Absolute: 0 10*3/uL (ref 0.0–0.1)
Basophils Relative: 0 % (ref 0–1)
Eosinophils Absolute: 0 10*3/uL (ref 0.0–0.7)
Eosinophils Relative: 1 % (ref 0–5)
HEMATOCRIT: 40 % (ref 36.0–46.0)
Hemoglobin: 13.3 g/dL (ref 12.0–15.0)
Lymphocytes Relative: 10 % — ABNORMAL LOW (ref 12–46)
Lymphs Abs: 0.6 10*3/uL — ABNORMAL LOW (ref 0.7–4.0)
MCH: 29.8 pg (ref 26.0–34.0)
MCHC: 33.3 g/dL (ref 30.0–36.0)
MCV: 89.7 fL (ref 78.0–100.0)
MONO ABS: 0.1 10*3/uL (ref 0.1–1.0)
Monocytes Relative: 2 % — ABNORMAL LOW (ref 3–12)
Neutro Abs: 5.1 10*3/uL (ref 1.7–7.7)
Neutrophils Relative %: 87 % — ABNORMAL HIGH (ref 43–77)
Platelets: 20 10*3/uL — CL (ref 150–400)
RBC: 4.46 MIL/uL (ref 3.87–5.11)
RDW: 12 % (ref 11.5–15.5)
WBC: 5.9 10*3/uL (ref 4.0–10.5)

## 2014-06-28 LAB — COMPREHENSIVE METABOLIC PANEL
ALK PHOS: 41 U/L (ref 39–117)
ALT: 8 U/L (ref 0–35)
AST: 17 U/L (ref 0–37)
Albumin: 3.6 g/dL (ref 3.5–5.2)
Anion gap: 8 (ref 5–15)
BILIRUBIN TOTAL: 0.3 mg/dL (ref 0.3–1.2)
BUN: 17 mg/dL (ref 6–23)
CO2: 23 mEq/L (ref 19–32)
CREATININE: 0.62 mg/dL (ref 0.50–1.10)
Calcium: 8.5 mg/dL (ref 8.4–10.5)
Chloride: 105 mEq/L (ref 96–112)
GFR calc Af Amer: >90 mL/min (ref >90)
Glucose, Bld: 111 mg/dL — ABNORMAL HIGH (ref 70–99)
Potassium: 4 mEq/L (ref 3.7–5.3)
SODIUM: 136 meq/L — AB (ref 137–147)
Total Protein: 6.6 g/dL (ref 6.0–8.3)

## 2014-06-28 LAB — TSH: TSH: 0.016 u[IU]/mL — AB (ref 0.350–4.500)

## 2014-06-28 LAB — RHEUMATOID FACTOR: Rhuematoid fact SerPl-aCnc: 10 IU/mL (ref <=14)

## 2014-06-28 LAB — LACTATE DEHYDROGENASE: LDH: 170 U/L (ref 94–250)

## 2014-06-28 LAB — VITAMIN B12: Vitamin B-12: >2000 pg/mL — ABNORMAL HIGH (ref 211–911)

## 2014-06-28 LAB — HIV ANTIBODY (ROUTINE TESTING W REFLEX): HIV 1&2 Ab, 4th Generation: NONREACTIVE

## 2014-06-28 MED ORDER — METHYLPREDNISOLONE SODIUM SUCC 125 MG IJ SOLR
60.0000 mg | INTRAMUSCULAR | Status: DC
  Administered 2014-06-28: 60 mg via INTRAVENOUS
  Filled 2014-06-28: qty 0.96
  Filled 2014-06-28: qty 2
  Filled 2014-06-28: qty 0.96

## 2014-06-28 NOTE — Progress Notes (Signed)
Patient Demographics  Ashley Key, is a 48 y.o. female, DOB - [birth year], KVQ:[id number]  Admit date - 06/27/2014   Admitting Physician Rise Patience, MD  Outpatient Primary MD for the patient is London Pepper, MD  LOS - 1   Chief Complaint  Patient presents with  . Bleeding/Bruising       Subjective:   Ashley Key today has, No headache, No chest pain, No abdominal pain - No Nausea, No new weakness tingling or numbness, No Cough - SOB.    Assessment & Plan    1. ITP causing  Thrombocytopenia - hematology following, has mild vagina bleed or bruises, on IV steroids, monitor daily CBC, no inciting factors but a new medication was added few weeks ago which was her CombiPatch for menopause, requested to discontinue that for now and to call and inform her OB about this change.    2. Low vitamin B 12 -  continue supplementation, check vitamin B 12.    3. Anxiety depression on Zoloft.     Code Status: Full  Family Communication: none present  Disposition Plan: Home   Procedures     Consults  Haem    Medications  Scheduled Meds: . amphetamine-dextroamphetamine  20 mg Oral Daily  . methylPREDNISolone (SOLU-MEDROL) injection  60 mg Intravenous Q24H  . omega-3 acid ethyl esters  1,000 mg Oral Daily  . sertraline  25 mg Oral QHS  . vitamin B-12  1,000 mcg Oral Daily   Continuous Infusions:  PRN Meds:.acetaminophen, acetaminophen, ondansetron (ZOFRAN) IV, ondansetron  DVT Prophylaxis   SCDs    Lab Results  Component Value Date   PLT 20* 06/28/2014    Antibiotics    Anti-infectives   None          Objective:   Filed Vitals:   06/27/14 2115 06/27/14 2200 06/27/14 2254 06/28/14 0524  BP: 127/79 137/93 137/78 114/65  Pulse: 62 66 74 59  Temp:   98.7 F  (37.1 C) 98.1 F (36.7 C)  TempSrc:   Oral Oral  Resp: 16  18 16   Height:   5\' 6"  (1.676 m)   Weight:   64.6 kg (142 lb 6.7 oz)   SpO2: 100% 100% 99% 98%    Wt Readings from Last 3 Encounters:  06/27/14 64.6 kg (142 lb 6.7 oz)  09/10/13 59.875 kg (132 lb)     Intake/Output Summary (Last 24 hours) at 06/28/14 0945 Last data filed at 06/28/14 0519  Gross per 24 hour  Intake    443 ml  Output    400 ml  Net     43 ml     Physical Exam  Awake Alert, Oriented X 3, No new F.N deficits, Normal affect [place].AT,PERRAL Supple Neck,No JVD, No cervical lymphadenopathy appriciated.  Symmetrical Chest wall movement, Good air movement bilaterally, CTAB RRR,No Gallops,Rubs or new Murmurs, No Parasternal Heave +ve B.Sounds, Abd Soft, No tenderness, No organomegaly appriciated, No rebound - guarding or rigidity. No Cyanosis, Clubbing or edema, No new Rash , multiple small bruises    Data Review   Micro Results No results found for this or any previous visit (from the past 240 hour(s)).  Radiology Reports No results found.  CBC  Recent Labs Lab  06/27/14 1951 06/28/14 0358  WBC 3.1* 5.9  HGB 13.1 13.3  HCT 38.7 40.0  PLT 28* 20*  MCV 89.4 89.7  MCH 30.3 29.8  MCHC 33.9 33.3  RDW 11.9 12.0  LYMPHSABS 1.2 0.6*  MONOABS 0.4 0.1  EOSABS 0.1 0.0  BASOSABS 0.0 0.0    Chemistries   Recent Labs Lab 06/27/14 1951 06/28/14 0358  NA 141 136*  K 4.4 4.0  CL 104 105  CO2 25 23  GLUCOSE 86 111*  BUN 17 17  CREATININE 0.71 0.62  CALCIUM 8.5 8.5  AST 17 17  ALT 8 8  ALKPHOS 45 41  BILITOT <0.2* 0.3   ------------------------------------------------------------------------------------------------------------------ estimated creatinine clearance is 79.6 ml/min (by C-G formula based on Cr of 0.62). ------------------------------------------------------------------------------------------------------------------ No results found for this basename: HGBA1C,  in the last 72  hours ------------------------------------------------------------------------------------------------------------------ No results found for this basename: CHOL, HDL, LDLCALC, TRIG, CHOLHDL, LDLDIRECT,  in the last 72 hours ------------------------------------------------------------------------------------------------------------------ No results found for this basename: TSH, T4TOTAL, FREET3, T3FREE, THYROIDAB,  in the last 72 hours ------------------------------------------------------------------------------------------------------------------ No results found for this basename: VITAMINB12, FOLATE, FERRITIN, TIBC, IRON, RETICCTPCT,  in the last 72 hours  Coagulation profile  Recent Labs Lab 06/27/14 1951  INR 1.10    No results found for this basename: DDIMER,  in the last 72 hours  Cardiac Enzymes No results found for this basename: CK, CKMB, TROPONINI, MYOGLOBIN,  in the last 168 hours ------------------------------------------------------------------------------------------------------------------ No components found with this basename: POCBNP,      Time Spent in minutes   35   SINGH,PRASHANT K M.D on 06/28/2014 at 9:45 AM  Between 7am to 7pm - Pager - [phone]  After 7pm go to [email or web address] - password [email or web address]  And look for the night coverage person covering for me after hours  [facility] Office  [phone]   **Disclaimer: This note may have been dictated with voice recognition software. Similar sounding words can inadvertently be transcribed and this note may contain transcription errors which may not have been corrected upon publication of note.**

## 2014-06-28 NOTE — Progress Notes (Signed)
See full consult note last p.m. She appears stable.  Recommendations: 1. Continue daily Solu-Medrol 2. Check a CBC in a.m. 06/29/2014 3. plan for discharge to home when there is a rise in the platelet count

## 2014-06-29 ENCOUNTER — Telehealth: Payer: Self-pay | Admitting: Oncology

## 2014-06-29 ENCOUNTER — Other Ambulatory Visit: Payer: Self-pay | Admitting: *Deleted

## 2014-06-29 DIAGNOSIS — D696 Thrombocytopenia, unspecified: Secondary | ICD-10-CM

## 2014-06-29 DIAGNOSIS — D693 Immune thrombocytopenic purpura: Secondary | ICD-10-CM

## 2014-06-29 LAB — CBC WITH DIFFERENTIAL/PLATELET
BASOS PCT: 0 % (ref 0–1)
Basophils Absolute: 0 10*3/uL (ref 0.0–0.1)
EOS ABS: 0 10*3/uL (ref 0.0–0.7)
EOS PCT: 0 % (ref 0–5)
HEMATOCRIT: 36.4 % (ref 36.0–46.0)
HEMOGLOBIN: 12.4 g/dL (ref 12.0–15.0)
LYMPHS ABS: 1.3 10*3/uL (ref 0.7–4.0)
Lymphocytes Relative: 22 % (ref 12–46)
MCH: 30.2 pg (ref 26.0–34.0)
MCHC: 34.1 g/dL (ref 30.0–36.0)
MCV: 88.8 fL (ref 78.0–100.0)
MONOS PCT: 8 % (ref 3–12)
Monocytes Absolute: 0.5 10*3/uL (ref 0.1–1.0)
NEUTROS PCT: 71 % (ref 43–77)
Neutro Abs: 4.4 10*3/uL (ref 1.7–7.7)
Platelets: 31 10*3/uL — ABNORMAL LOW (ref 150–400)
RBC: 4.1 MIL/uL (ref 3.87–5.11)
RDW: 11.9 % (ref 11.5–15.5)
WBC: 6.2 10*3/uL (ref 4.0–10.5)

## 2014-06-29 LAB — ANA: ANA: POSITIVE — AB

## 2014-06-29 LAB — ANTI-NUCLEAR AB-TITER (ANA TITER)

## 2014-06-29 LAB — T4, FREE: Free T4: 1.66 ng/dL (ref 0.80–1.80)

## 2014-06-29 MED ORDER — PREDNISONE 10 MG PO TABS
60.0000 mg | ORAL_TABLET | Freq: Every day | ORAL | Status: DC
Start: 2014-06-29 — End: 2014-06-29
  Administered 2014-06-29: 60 mg via ORAL
  Filled 2014-06-29 (×2): qty 1

## 2014-06-29 MED ORDER — OMEPRAZOLE 40 MG PO CPDR: 40.0000 mg | DELAYED_RELEASE_CAPSULE | Freq: Every day | ORAL | Status: DC

## 2014-06-29 MED ORDER — PREDNISONE 20 MG PO TABS: 60.0000 mg | ORAL_TABLET | Freq: Every day | ORAL | Status: DC

## 2014-06-29 NOTE — Telephone Encounter (Signed)
Per 07/22 POF added labs/ov ....Marland KitchenMarland KitchenKJ

## 2014-06-29 NOTE — Progress Notes (Signed)
IP PROGRESS NOTE  Subjective:   No bleeding. No specific complaint.  Objective: Vital signs in last 24 hours: Blood pressure 126/65, pulse 54, temperature 98 F (36.7 C), temperature source Oral, resp. rate 16, height 5\' 6"  (1.676 m), weight 142 lb 6.7 oz (64.6 kg), SpO2 100.00%.  Intake/Output from previous day: 07/21 0701 - 07/22 0700 In: 960 [P.O.:960] Out: 2600 [Urine:2600]  Physical Exam:  HEENT: No thrush or bleeding Lungs: Clear bilaterally Cardiac: Regular rate and rhythm Extremities: No leg edema Skin: Few small ecchymoses over the lower legs. Resolving ecchymosis at the left upper thigh   Lab Results:  Recent Labs  06/28/14 0358 06/29/14 0600  WBC 5.9 6.2  HGB 13.3 12.4  HCT 40.0 36.4  PLT 20* 31*    BMET  Recent Labs  06/27/14 1951 06/28/14 0358  NA 141 136*  K 4.4 4.0  CL 104 105  CO2 25 23  GLUCOSE 86 111*  BUN 17 17  CREATININE 0.71 0.62  CALCIUM 8.5 8.5   06/27/2014-LDH 170, rheumatoid factor can, HIV nonreactive 06/28/2014-TSH 0.016 Studies/Results: No results found.  Medications: I have reviewed the patient's current medications.  Assessment/Plan:  1. Severe thrombocytopenia -likely secondary to ITP, partially improved 2. bruising secondary to #1  3. history of vaginal bleeding secondary to #1  4. Shotty lymphadenopathy-likely benign  5. history of ADHD  6. mild leukopenia-neutropenia  7. G0 P0 8. low TSH-evaluate her primary M.D.  The platelet count is higher today. Hopefully this represents a response to the prednisone. She appears stable for discharge to continue outpatient prednisone therapy.  1. Change to prednisone-60 mg daily 2. evaluate low TSH per the medicine service and her primary physician 3. outpatient CBC at the [facility] 07/01/2014  Please call hematology as needed. We will schedule an outpatient appointment.    LOS: 2 days   Birda Didonato  06/29/2014, 8:55 AM

## 2014-06-29 NOTE — Discharge Summary (Signed)
Physician Discharge Summary  Ashley Key. Ashley Key DDU:[id number] DOB: July 10, 1966 DOA: 06/27/2014  PCP: London Pepper, MD  Admit date: 06/27/2014 Discharge date: 06/29/2014  Time spent: <30 minutes  Recommendations for Outpatient Follow-up:  Follow-up Information   Follow up with London Pepper, MD. (in 1-2weeks, call for appt upon discharge)    Specialty:  Family Medicine   Contact information:   [address] [address] [phone]       Follow up On 07/01/2014. (Lab/CBC at Cancer center as directed per Dr Learta Codding)       Follow up with Ashley Coder, MD. (in 1-2weeks as directed, call for appt upon discharge)    Specialty:  Oncology   Contact information:   [place] [address] [phone]      Followup labs 1. Followup on positive ANA further work up with PCP 2. CBC on 7/24 at [facility]  Discharge Diagnoses:  Active Problems:   ITP (idiopathic thrombocytopenic purpura)   Thrombocytopenia   Discharge Condition: improved/stble  Diet recommendation: Regular  Filed Weights   06/27/14 1827 06/27/14 2254  Weight: 63.186 kg (139 lb 4.8 oz) 64.6 kg (142 lb 6.7 oz)    History of present illness:  Ashley. Key is a 48 y.o. female with history of ADHD was recently placed on estrogen patch 2 months ago for menopausal symptoms was found having increasing vaginal bleeding and was placed on progesterone. 2 weeks ago patient was noticed to have increasing bruising over the lower extremities. Patient was referred to her primary care by the gynecologist and blood count were ordered today and her platelet count was found to be 12,000 and patient was advised to come to the ER. In the ER on call oncologist Dr. Learta Codding was consulted and patient has been admitted for further management. Patient denies any fever chills or any insect bites. Other than the estrogen and progesterone no other new medications were started. In the ER patient was  afebrile. Patient denied any epistaxis or rectal bleeding. She was admitted for further evaluation and management   Hospital Course:  1. ITP causing Thrombocytopenia - As discussed above upon admission patient was started on IV steroids following consultation with hematology. Her ANA was done and came back positive the titer of 1:40, HIV was nonreactive. Dr. Learta Codding followed up with patient and his impression was that this was most likely secondary to ITP. No gross bleeding reported during this hospital stay . Her platelet count improved with steroids to 31 today, and per Dr. Learta Codding change her to prednisone which she is to continue on discharge, and from his standpoint okay to DC and have her followup for labs/CBC at [facility] on 7/24. it was noted that the only new medication was added few weeks ago which was her CombiPatch for menopause, this was discontinued in the hospital and patient to followup with her GYN for further recommendations for management of her menopausal symptoms.  2. Abnormal TSH: Patient had a TSH done and it came back low at 0.016, and a free T4 level ordered today but results pending at this time. The patient is asymptomatic-no clinical findings consistent with hyperthyroidism at this time. It is noted that she did receive IV Solu-Medrol prior to this TSH been drawn which could be contributing to this finding. Patient initially was okay with waiting for the results of the free T4 to come back but she is now decided that she no longer wants to wait and prefers  to followup with her PCP for the results and further workup/possible referral to endocrinology pending results as clinically appropriate 3. history of vitamin B12 deficiency - resolved.patient had a vitamin B12 level done in the hospital and it came back greater than 2000. 4. Anxiety depression-continue Zoloft. 5. abnormal ANA-as above patient is to followup with her PCP for further eval  management.  Procedures:  none  Consultations:  Hematology-Dr. Malachy Mood  Discharge Exam: Filed Vitals:   06/29/14 1432  BP: 137/77  Pulse: 89  Temp: 98.4 F (36.9 C)  Resp: 18   Awake Alert, Oriented X 3, No new F.N deficits, Normal affect  [place].AT,PERRAL  Supple Neck,No JVD, No cervical lymphadenopathy appriciated.  Symmetrical Chest wall movement, Good air movement bilaterally, CTAB  RRR,No Gallops,Rubs or new Murmurs, No Parasternal Heave  +ve B.Sounds, Abd Soft, No tenderness, No organomegaly appriciated, No rebound - guarding or rigidity.  No Cyanosis, Clubbing or edema, No new Rash , multiple small bruises     Discharge Instructions You were cared for by a hospitalist during your hospital stay. If you have any questions about your discharge medications or the care you received while you were in the hospital after you are discharged, you can call the unit and asked to speak with the hospitalist on call if the hospitalist that took care of you is not available. Once you are discharged, your primary care physician will handle any further medical issues. Please note that NO REFILLS for any discharge medications will be authorized once you are discharged, as it is imperative that you return to your primary care physician (or establish a relationship with a primary care physician if you do not have one) for your aftercare needs so that they can reassess your need for medications and monitor your lab values.  Discharge Instructions   Diet general    Complete by:  As directed      Increase activity slowly    Complete by:  As directed          STOP thes medications estradiol-norethindrone 0.05-0.14 MG/DAY  Commonly known as:  COMBIPATCH  Place 1 patch onto the skin 2 (two) times a week. *changes on Wednesday and Saturday*     Medication List         ACIDOPHILUS PO  Take 1 tablet by mouth daily.     amphetamine-dextroamphetamine 20 MG 24 hr capsule  Commonly known as:   ADDERALL XR  Take 20 mg by mouth daily.     CALCIUM-VITAMIN D PO  Take 1 tablet by mouth 2 (two) times daily.          Fish Oil 1200 MG Caps  Take 1,200 mg by mouth daily.     MULTIVITAMIN PO  Take 1 tablet by mouth daily.     omeprazole 40 MG capsule  Commonly known as:  PRILOSEC  Take 1 capsule (40 mg total) by mouth daily.     predniSONE 20 MG tablet  Commonly known as:  DELTASONE  Take 3 tablets (60 mg total) by mouth daily with breakfast.     sertraline 25 MG tablet  Commonly known as:  ZOLOFT  Take 25 mg by mouth at bedtime.     vitamin B-12 1000 MCG tablet  Commonly known as:  CYANOCOBALAMIN  Take 1,000 mcg by mouth daily.       No Known Allergies     Follow-up Information   Follow up with London Pepper, MD. (in 1-2weeks, call for appt upon discharge)  Specialty:  Family Medicine   Contact information:   [address] [address] [phone]       Follow up On 07/01/2014. (Lab/CBC at Cancer center as directed per Dr Learta Codding)       Follow up with Ashley Coder, MD. (in 1-2weeks as directed, call for appt upon discharge)    Specialty:  Oncology   Contact information:   [place] [address] [phone]        The results of significant diagnostics from this hospitalization (including imaging, microbiology, ancillary and laboratory) are listed below for reference.    Significant Diagnostic Studies: No results found.  Microbiology: No results found for this or any previous visit (from the past 240 hour(s)).   Labs: Basic Metabolic Panel:  Recent Labs Lab 06/27/14 1951 06/28/14 0358  NA 141 136*  K 4.4 4.0  CL 104 105  CO2 25 23  GLUCOSE 86 111*  BUN 17 17  CREATININE 0.71 0.62  CALCIUM 8.5 8.5   Liver Function Tests:  Recent Labs Lab 06/27/14 1951 06/28/14 0358  AST 17 17  ALT 8 8  ALKPHOS 45 41  BILITOT <0.2* 0.3  PROT 6.9 6.6  ALBUMIN 3.8 3.6   No results found for  this basename: LIPASE, AMYLASE,  in the last 168 hours No results found for this basename: AMMONIA,  in the last 168 hours CBC:  Recent Labs Lab 06/27/14 1951 06/28/14 0358 06/29/14 0600  WBC 3.1* 5.9 6.2  NEUTROABS 1.4* 5.1 4.4  HGB 13.1 13.3 12.4  HCT 38.7 40.0 36.4  MCV 89.4 89.7 88.8  PLT 28* 20* 31*   Cardiac Enzymes: No results found for this basename: CKTOTAL, CKMB, CKMBINDEX, TROPONINI,  in the last 168 hours BNP: BNP (last 3 results) No results found for this basename: PROBNP,  in the last 8760 hours CBG: No results found for this basename: GLUCAP,  in the last 168 hours     Signed:  Sheila Oats  [facility] 06/29/2014, 4:32 PM

## 2014-06-29 NOTE — Progress Notes (Signed)
Pt and husband given discharge instructions.  They verbalized understanding of all instructions, follow-up appts, and med schedule.  Prescriptions given.  No other questions or concerns at this time.  Pt refused wheelchair for discharge, so RN walked with pt and husband out for discharge home.

## 2014-07-01 ENCOUNTER — Telehealth: Payer: Self-pay | Admitting: *Deleted

## 2014-07-01 ENCOUNTER — Other Ambulatory Visit (HOSPITAL_BASED_OUTPATIENT_CLINIC_OR_DEPARTMENT_OTHER): Payer: BC Managed Care – PPO

## 2014-07-01 DIAGNOSIS — D473 Essential (hemorrhagic) thrombocythemia: Secondary | ICD-10-CM

## 2014-07-01 DIAGNOSIS — D693 Immune thrombocytopenic purpura: Secondary | ICD-10-CM

## 2014-07-01 LAB — CBC WITH DIFFERENTIAL/PLATELET
BASO%: 0.4 % (ref 0.0–2.0)
BASOS ABS: 0 10*3/uL (ref 0.0–0.1)
EOS ABS: 0 10*3/uL (ref 0.0–0.5)
EOS%: 0.1 % (ref 0.0–7.0)
HCT: 40.9 % (ref 34.8–46.6)
HGB: 13.3 g/dL (ref 11.6–15.9)
LYMPH%: 13.6 % — AB (ref 14.0–49.7)
MCH: 29.4 pg (ref 25.1–34.0)
MCHC: 32.6 g/dL (ref 31.5–36.0)
MCV: 90.1 fL (ref 79.5–101.0)
MONO#: 0.2 10*3/uL (ref 0.1–0.9)
MONO%: 2.5 % (ref 0.0–14.0)
NEUT%: 83.4 % — ABNORMAL HIGH (ref 38.4–76.8)
NEUTROS ABS: 5 10*3/uL (ref 1.5–6.5)
PLATELETS: 86 10*3/uL — AB (ref 145–400)
RBC: 4.54 10*6/uL (ref 3.70–5.45)
RDW: 11.8 % (ref 11.2–14.5)
WBC: 6 10*3/uL (ref 3.9–10.3)
lymph#: 0.8 10*3/uL — ABNORMAL LOW (ref 0.9–3.3)

## 2014-07-01 NOTE — Telephone Encounter (Signed)
Per Dr. Benay Spice; notified pt to decrease prednisone to 40 mg daily (2 tablets daily); platelets up to 86.  Pt verbalized understanding and expressed appreciation for call.  Confirmed appt 06/2814.

## 2014-07-01 NOTE — Telephone Encounter (Signed)
Message copied by Domenic Schwab on Fri Jul 01, 2014  5:12 PM ------      Message from: Betsy Coder B      Created: Fri Jul 01, 2014  4:53 PM       Please call patient, decrease prednisone to 40mg  daily, f/u as scheduled ------

## 2014-07-05 ENCOUNTER — Other Ambulatory Visit: Payer: Self-pay | Admitting: *Deleted

## 2014-07-05 ENCOUNTER — Telehealth: Payer: Self-pay | Admitting: Oncology

## 2014-07-05 ENCOUNTER — Encounter: Payer: Self-pay | Admitting: Oncology

## 2014-07-05 ENCOUNTER — Other Ambulatory Visit (HOSPITAL_BASED_OUTPATIENT_CLINIC_OR_DEPARTMENT_OTHER): Payer: BC Managed Care – PPO

## 2014-07-05 ENCOUNTER — Ambulatory Visit (HOSPITAL_BASED_OUTPATIENT_CLINIC_OR_DEPARTMENT_OTHER): Payer: BC Managed Care – PPO | Admitting: Oncology

## 2014-07-05 VITALS — BP 130/90 | HR 80 | Temp 98.1°F | Resp 18 | Ht 66.0 in | Wt 139.7 lb

## 2014-07-05 DIAGNOSIS — R894 Abnormal immunological findings in specimens from other organs, systems and tissues: Secondary | ICD-10-CM

## 2014-07-05 DIAGNOSIS — F909 Attention-deficit hyperactivity disorder, unspecified type: Secondary | ICD-10-CM

## 2014-07-05 DIAGNOSIS — D693 Immune thrombocytopenic purpura: Secondary | ICD-10-CM

## 2014-07-05 DIAGNOSIS — D696 Thrombocytopenia, unspecified: Secondary | ICD-10-CM

## 2014-07-05 DIAGNOSIS — G4700 Insomnia, unspecified: Secondary | ICD-10-CM

## 2014-07-05 LAB — CBC WITH DIFFERENTIAL/PLATELET
BASO%: 0.6 % (ref 0.0–2.0)
Basophils Absolute: 0 10*3/uL (ref 0.0–0.1)
EOS%: 1 % (ref 0.0–7.0)
Eosinophils Absolute: 0.1 10*3/uL (ref 0.0–0.5)
HCT: 38.8 % (ref 34.8–46.6)
HGB: 12.7 g/dL (ref 11.6–15.9)
LYMPH%: 25.8 % (ref 14.0–49.7)
MCH: 29.8 pg (ref 25.1–34.0)
MCHC: 32.8 g/dL (ref 31.5–36.0)
MCV: 90.8 fL (ref 79.5–101.0)
MONO#: 0.5 10*3/uL (ref 0.1–0.9)
MONO%: 8.1 % (ref 0.0–14.0)
NEUT#: 3.9 10*3/uL (ref 1.5–6.5)
NEUT%: 64.5 % (ref 38.4–76.8)
Platelets: 151 10*3/uL (ref 145–400)
RBC: 4.28 10*6/uL (ref 3.70–5.45)
RDW: 12 % (ref 11.2–14.5)
WBC: 6.1 10*3/uL (ref 3.9–10.3)
lymph#: 1.6 10*3/uL (ref 0.9–3.3)

## 2014-07-05 MED ORDER — PREDNISONE 10 MG PO TABS: 30.0000 mg | ORAL_TABLET | Freq: Every day | ORAL | Status: DC

## 2014-07-05 NOTE — Progress Notes (Signed)
  [place] OFFICE PROGRESS NOTE   Diagnosis: ITP  INTERVAL HISTORY:   I saw her in the hospital last week when she presented with bruising, vaginal bleeding, and severe thrombocytopenia. She was started on steroids. The platelet count was up to 86,000 on 07/01/2014 and the prednisone was decreased to 40 mg daily. She denies bleeding. Leg bruises are healing. She complains of insomnia since starting prednisone. No other apparent side effects.  I discussed the case with her gynecologist yesterday. She has been placed back on a hormone patch for treatment of flashes.  Objective:  Vital signs in last 24 hours:  Blood pressure 130/90, pulse 80, temperature 98.1 F (36.7 C), temperature source Oral, resp. rate 18, height 5\' 6"  (1.676 m), weight 139 lb 11.2 oz (63.368 kg), SpO2 100.00%.    HEENT: No thrush Lymphatics: ? "Shotty "left posterior scalene node, no other palpable cervical or supraclavicular nodes Resp: Lungs clear bilaterally Cardio: Regular rate and rhythm GI: No hepatosplenomegaly Vascular: No leg edema  Skin: The ecchymosis at the left upper thigh has almost completely resolved.     Lab Results:  Lab Results  Component Value Date   WBC 6.1 07/05/2014   HGB 12.7 07/05/2014   HCT 38.8 07/05/2014   MCV 90.8 07/05/2014   PLT 151 07/05/2014   NEUTROABS 3.9 07/05/2014   06/29/2014-free T4-1.66 06/28/2014-TSH 0.016  06/27/2014-ANA positive, 1:40, speckled pattern  Medications: I have reviewed the patient's current medications.  Assessment/Plan: 1. Severe thrombocytopenia -clinical presentation consistent with a diagnosis of ITP, improved with prednisone 2. bruising secondary to #1 -resolved 3. history of vaginal bleeding secondary to #1  4. Shotty lymphadenopathy-likely benign  5. history of ADHD  6. mild leukopenia-neutropenia on presentation with ITP 06/27/2014 7. G0 P0  8. low TSH, normal free T4-evaluate per primary M.D. 9. low level positive  ANA  Disposition:  Ashley Key appears to have ITP. The thrombocytopenia has responded to prednisone. She will continue a slow prednisone taper. We decreased the prednisone to 30 mg daily. She will return for a CBC in one week.  I suspect the low titer ANA is a nonspecific finding. She does not have other clinical symptoms to suggest a diagnosis of SLE or another collagen vascular disorder. This can be repeated or evaluated further if she develops new symptoms.  Ashley Key will be scheduled for an office visit in approximately one month. Betsy Coder, MD  07/05/2014  11:23 AM

## 2014-07-05 NOTE — Telephone Encounter (Signed)
gv pt appt schedule for aug/sept °

## 2014-07-12 ENCOUNTER — Other Ambulatory Visit (HOSPITAL_BASED_OUTPATIENT_CLINIC_OR_DEPARTMENT_OTHER): Payer: BC Managed Care – PPO

## 2014-07-12 DIAGNOSIS — D696 Thrombocytopenia, unspecified: Secondary | ICD-10-CM

## 2014-07-12 DIAGNOSIS — D693 Immune thrombocytopenic purpura: Secondary | ICD-10-CM

## 2014-07-12 LAB — CBC WITH DIFFERENTIAL/PLATELET
BASO%: 0.1 % (ref 0.0–2.0)
Basophils Absolute: 0 10*3/uL (ref 0.0–0.1)
EOS ABS: 0 10*3/uL (ref 0.0–0.5)
EOS%: 0 % (ref 0.0–7.0)
HCT: 40 % (ref 34.8–46.6)
HGB: 13.3 g/dL (ref 11.6–15.9)
LYMPH%: 8.1 % — AB (ref 14.0–49.7)
MCH: 29.8 pg (ref 25.1–34.0)
MCHC: 33.3 g/dL (ref 31.5–36.0)
MCV: 89.5 fL (ref 79.5–101.0)
MONO#: 0.2 10*3/uL (ref 0.1–0.9)
MONO%: 2.1 % (ref 0.0–14.0)
NEUT#: 6.8 10*3/uL — ABNORMAL HIGH (ref 1.5–6.5)
NEUT%: 89.7 % — ABNORMAL HIGH (ref 38.4–76.8)
PLATELETS: 174 10*3/uL (ref 145–400)
RBC: 4.47 10*6/uL (ref 3.70–5.45)
RDW: 12.2 % (ref 11.2–14.5)
WBC: 7.6 10*3/uL (ref 3.9–10.3)
lymph#: 0.6 10*3/uL — ABNORMAL LOW (ref 0.9–3.3)

## 2014-07-13 ENCOUNTER — Telehealth: Payer: Self-pay | Admitting: *Deleted

## 2014-07-13 NOTE — Telephone Encounter (Signed)
Message copied by Brien Few on Wed Jul 13, 2014  8:24 AM ------      Message from: Ashley Key      Created: Tue Jul 12, 2014  7:26 PM       Please call patient, platelets are better, decrease prednisone to 20mg  daily, check cbc 2 weeks ------

## 2014-07-13 NOTE — Telephone Encounter (Signed)
Called pt with lab results: PLT better, decrease prednisone to 20 mg/ day. Pt voiced understanding. Has enough tablets on hand. Confirmed lab appt for 8/14.

## 2014-07-26 ENCOUNTER — Other Ambulatory Visit (HOSPITAL_BASED_OUTPATIENT_CLINIC_OR_DEPARTMENT_OTHER): Payer: BC Managed Care – PPO

## 2014-07-26 DIAGNOSIS — D693 Immune thrombocytopenic purpura: Secondary | ICD-10-CM

## 2014-07-26 DIAGNOSIS — D696 Thrombocytopenia, unspecified: Secondary | ICD-10-CM

## 2014-07-26 LAB — CBC WITH DIFFERENTIAL/PLATELET
BASO%: 0.3 % (ref 0.0–2.0)
Basophils Absolute: 0 10*3/uL (ref 0.0–0.1)
EOS ABS: 0 10*3/uL (ref 0.0–0.5)
EOS%: 0 % (ref 0.0–7.0)
HEMATOCRIT: 40.8 % (ref 34.8–46.6)
HGB: 13.3 g/dL (ref 11.6–15.9)
LYMPH#: 0.9 10*3/uL (ref 0.9–3.3)
LYMPH%: 14.7 % (ref 14.0–49.7)
MCH: 29.6 pg (ref 25.1–34.0)
MCHC: 32.6 g/dL (ref 31.5–36.0)
MCV: 90.9 fL (ref 79.5–101.0)
MONO#: 0.2 10*3/uL (ref 0.1–0.9)
MONO%: 3.7 % (ref 0.0–14.0)
NEUT#: 4.7 10*3/uL (ref 1.5–6.5)
NEUT%: 81.3 % — AB (ref 38.4–76.8)
Platelets: 221 10*3/uL (ref 145–400)
RBC: 4.49 10*6/uL (ref 3.70–5.45)
RDW: 12.1 % (ref 11.2–14.5)
WBC: 5.8 10*3/uL (ref 3.9–10.3)

## 2014-07-28 ENCOUNTER — Telehealth: Payer: Self-pay | Admitting: *Deleted

## 2014-07-28 NOTE — Telephone Encounter (Signed)
Per Dr. Benay Spice; notified pt that platelets are normal; pt states she is currently taking 20 mg prednisone daily.  After confirming with Dr. Benay Spice; left message for pt to decrease prednisone to 10 mg daily and f/u as schedule.  If re-fills needed to call office.

## 2014-07-28 NOTE — Telephone Encounter (Signed)
Message copied by Domenic Schwab on Thu Jul 28, 2014  3:36 PM ------      Message from: Betsy Coder B      Created: Tue Jul 26, 2014 10:07 PM       Please call patient, platelets are normal, decrease prednisone to 20mg  daily, f/u as scheduled ------

## 2014-08-02 ENCOUNTER — Other Ambulatory Visit: Payer: Self-pay | Admitting: *Deleted

## 2014-08-02 MED ORDER — OMEPRAZOLE 40 MG PO CPDR: 40.0000 mg | DELAYED_RELEASE_CAPSULE | Freq: Every day | ORAL | Status: DC

## 2014-08-09 ENCOUNTER — Other Ambulatory Visit (HOSPITAL_BASED_OUTPATIENT_CLINIC_OR_DEPARTMENT_OTHER): Payer: BC Managed Care – PPO

## 2014-08-09 ENCOUNTER — Ambulatory Visit (HOSPITAL_BASED_OUTPATIENT_CLINIC_OR_DEPARTMENT_OTHER): Payer: BC Managed Care – PPO | Admitting: Oncology

## 2014-08-09 ENCOUNTER — Other Ambulatory Visit: Payer: Self-pay | Admitting: *Deleted

## 2014-08-09 ENCOUNTER — Telehealth: Payer: Self-pay | Admitting: Oncology

## 2014-08-09 VITALS — BP 139/90 | HR 87 | Temp 97.9°F | Resp 19 | Ht 66.0 in | Wt 138.8 lb

## 2014-08-09 DIAGNOSIS — F909 Attention-deficit hyperactivity disorder, unspecified type: Secondary | ICD-10-CM

## 2014-08-09 DIAGNOSIS — D693 Immune thrombocytopenic purpura: Secondary | ICD-10-CM

## 2014-08-09 DIAGNOSIS — D696 Thrombocytopenia, unspecified: Secondary | ICD-10-CM

## 2014-08-09 LAB — CBC WITH DIFFERENTIAL/PLATELET
BASO%: 0.6 % (ref 0.0–2.0)
BASOS ABS: 0 10*3/uL (ref 0.0–0.1)
EOS%: 0.1 % (ref 0.0–7.0)
Eosinophils Absolute: 0 10*3/uL (ref 0.0–0.5)
HEMATOCRIT: 40.4 % (ref 34.8–46.6)
HEMOGLOBIN: 13 g/dL (ref 11.6–15.9)
LYMPH%: 18.1 % (ref 14.0–49.7)
MCH: 29 pg (ref 25.1–34.0)
MCHC: 32.3 g/dL (ref 31.5–36.0)
MCV: 89.9 fL (ref 79.5–101.0)
MONO#: 0.3 10*3/uL (ref 0.1–0.9)
MONO%: 5.9 % (ref 0.0–14.0)
NEUT#: 3.3 10*3/uL (ref 1.5–6.5)
NEUT%: 75.3 % (ref 38.4–76.8)
PLATELETS: 215 10*3/uL (ref 145–400)
RBC: 4.49 10*6/uL (ref 3.70–5.45)
RDW: 12.1 % (ref 11.2–14.5)
WBC: 4.4 10*3/uL (ref 3.9–10.3)
lymph#: 0.8 10*3/uL — ABNORMAL LOW (ref 0.9–3.3)

## 2014-08-09 MED ORDER — OMEPRAZOLE 40 MG PO CPDR: 40.0000 mg | DELAYED_RELEASE_CAPSULE | Freq: Every day | ORAL | Status: DC

## 2014-08-09 NOTE — Telephone Encounter (Signed)
gv pt appt schedule for sept/dec

## 2014-08-09 NOTE — Progress Notes (Signed)
  [place] OFFICE PROGRESS NOTE   Diagnosis: ITP INTERVAL HISTORY:   She returns as scheduled. She is currently taking prednisone at a dose of 10 mg daily. No bruising or vaginal bleeding. She reports breaking part up to molar teeth.  Objective:  Vital signs in last 24 hours:  Blood pressure 139/90, pulse 87, temperature 97.9 F (36.6 C), temperature source Oral, resp. rate 19, height 5\' 6"  (1.676 m), weight 138 lb 12.8 oz (62.959 kg).    HEENT: No thrush or bleeding, mild whitecoat over the tongue Lymphatics: ? Pea-sized left scalene node Resp: Lungs clear bilaterally Cardio: Regular rate and rhythm GI: No hepatosplenomegaly Vascular: No leg edema  Skin: No ecchymoses    Lab Results:  Lab Results  Component Value Date   WBC 4.4 08/09/2014   HGB 13.0 08/09/2014   HCT 40.4 08/09/2014   MCV 89.9 08/09/2014   PLT 215 08/09/2014   NEUTROABS 3.3 08/09/2014    Medications: I have reviewed the patient's current medications.  Assessment/Plan: 1. Severe thrombocytopenia 06/27/2014 -clinical presentation consistent with a diagnosis of ITP, improved with prednisone  2. bruising secondary to #1 -resolved  3. history of vaginal bleeding secondary to #1 , resolved 4. Shotty lymphadenopathy-likely benign  5. history of ADHD  6. mild leukopenia-neutropenia on presentation with ITP 06/27/2014  7. G0 P0  8. low TSH, normal free T4-evaluate per primary M.D.  9. low level positive ANA    Disposition:  The platelet count remains in the normal range. The plan is to continue a slow prednisone taper. The prednisone was decreased to 5 mg daily. She will contact us for bleeding or bruising. Ms. Judice will return for a CBC in 3 weeks. She is scheduled for an office visit in 3 months.  Betsy Coder, MD  08/09/2014  4:39 PM

## 2014-08-31 ENCOUNTER — Other Ambulatory Visit (HOSPITAL_BASED_OUTPATIENT_CLINIC_OR_DEPARTMENT_OTHER): Payer: BC Managed Care – PPO

## 2014-08-31 DIAGNOSIS — D696 Thrombocytopenia, unspecified: Secondary | ICD-10-CM

## 2014-08-31 DIAGNOSIS — D693 Immune thrombocytopenic purpura: Secondary | ICD-10-CM

## 2014-08-31 LAB — CBC WITH DIFFERENTIAL/PLATELET
BASO%: 0.8 % (ref 0.0–2.0)
BASOS ABS: 0 10*3/uL (ref 0.0–0.1)
EOS%: 0.1 % (ref 0.0–7.0)
Eosinophils Absolute: 0 10*3/uL (ref 0.0–0.5)
HEMATOCRIT: 40.6 % (ref 34.8–46.6)
HGB: 13 g/dL (ref 11.6–15.9)
LYMPH%: 24 % (ref 14.0–49.7)
MCH: 28.8 pg (ref 25.1–34.0)
MCHC: 32 g/dL (ref 31.5–36.0)
MCV: 90 fL (ref 79.5–101.0)
MONO#: 0.4 10*3/uL (ref 0.1–0.9)
MONO%: 7.6 % (ref 0.0–14.0)
NEUT#: 3.5 10*3/uL (ref 1.5–6.5)
NEUT%: 67.5 % (ref 38.4–76.8)
Platelets: 197 10*3/uL (ref 145–400)
RBC: 4.51 10*6/uL (ref 3.70–5.45)
RDW: 12.4 % (ref 11.2–14.5)
WBC: 5.1 10*3/uL (ref 3.9–10.3)
lymph#: 1.2 10*3/uL (ref 0.9–3.3)

## 2014-09-02 ENCOUNTER — Telehealth: Payer: Self-pay | Admitting: *Deleted

## 2014-09-02 NOTE — Telephone Encounter (Signed)
Message copied by Brien Few on Fri Sep 02, 2014  4:29 PM ------      Message from: Betsy Coder B      Created: Wed Aug 31, 2014  8:47 PM       Please call patient, platelets are normal, decrease prednisone to 5mg  qod, check cbc, 3 weeks ------

## 2014-09-02 NOTE — Telephone Encounter (Signed)
Left message on voicemail instructing pt to decrease Prednisone to 5 mg QOD. Recheck lab in 3 weeks. Requested she call office to confirm instructions.

## 2014-09-12 ENCOUNTER — Telehealth: Payer: Self-pay | Admitting: *Deleted

## 2014-09-12 DIAGNOSIS — D693 Immune thrombocytopenic purpura: Secondary | ICD-10-CM

## 2014-09-12 NOTE — Telephone Encounter (Signed)
Message copied by Tania Ade on Mon Sep 12, 2014  9:45 AM ------      Message from: Betsy Coder B      Created: Wed Aug 31, 2014  8:47 PM       Please call patient, platelets are normal, decrease prednisone to 5mg  qod, check cbc, 3 weeks ------

## 2014-09-12 NOTE — Telephone Encounter (Signed)
Patient called to confirm instructions on taking Prednisone. Per office note, lab orders to scheduler to contact patient.

## 2014-09-12 NOTE — Telephone Encounter (Signed)
Left VM confirming her normal platelet count and to decrease Prednisone to 5 mg qod. Requested she return call to confirm instructions and schedule her 3 week lab. POF sent for lab.

## 2014-09-13 ENCOUNTER — Telehealth: Payer: Self-pay | Admitting: Oncology

## 2014-09-13 NOTE — Telephone Encounter (Signed)
S/W pt confirming labs per 10/05 POF on 10/12, pt can't do 10/14 due to spouse out of town......Marland Kitchen KJ

## 2014-09-15 ENCOUNTER — Other Ambulatory Visit: Payer: Self-pay | Admitting: *Deleted

## 2014-09-15 DIAGNOSIS — D696 Thrombocytopenia, unspecified: Secondary | ICD-10-CM

## 2014-09-15 MED ORDER — OMEPRAZOLE 40 MG PO CPDR: 40.0000 mg | DELAYED_RELEASE_CAPSULE | Freq: Every day | ORAL | Status: DC

## 2014-09-19 ENCOUNTER — Other Ambulatory Visit (HOSPITAL_BASED_OUTPATIENT_CLINIC_OR_DEPARTMENT_OTHER): Payer: BC Managed Care – PPO

## 2014-09-19 DIAGNOSIS — D696 Thrombocytopenia, unspecified: Secondary | ICD-10-CM

## 2014-09-19 DIAGNOSIS — D693 Immune thrombocytopenic purpura: Secondary | ICD-10-CM

## 2014-09-19 LAB — CBC WITH DIFFERENTIAL/PLATELET
BASO%: 1.1 % (ref 0.0–2.0)
BASOS ABS: 0.1 10*3/uL (ref 0.0–0.1)
EOS%: 1.2 % (ref 0.0–7.0)
Eosinophils Absolute: 0.1 10*3/uL (ref 0.0–0.5)
HCT: 40.5 % (ref 34.8–46.6)
HEMOGLOBIN: 13 g/dL (ref 11.6–15.9)
LYMPH%: 26.7 % (ref 14.0–49.7)
MCH: 29 pg (ref 25.1–34.0)
MCHC: 32.2 g/dL (ref 31.5–36.0)
MCV: 90.2 fL (ref 79.5–101.0)
MONO#: 0.3 10*3/uL (ref 0.1–0.9)
MONO%: 6.8 % (ref 0.0–14.0)
NEUT#: 2.9 10*3/uL (ref 1.5–6.5)
NEUT%: 64.2 % (ref 38.4–76.8)
Platelets: 182 10*3/uL (ref 145–400)
RBC: 4.49 10*6/uL (ref 3.70–5.45)
RDW: 13 % (ref 11.2–14.5)
WBC: 4.6 10*3/uL (ref 3.9–10.3)
lymph#: 1.2 10*3/uL (ref 0.9–3.3)

## 2014-09-21 ENCOUNTER — Other Ambulatory Visit: Payer: BC Managed Care – PPO

## 2014-09-21 ENCOUNTER — Telehealth: Payer: Self-pay | Admitting: *Deleted

## 2014-09-21 DIAGNOSIS — D693 Immune thrombocytopenic purpura: Secondary | ICD-10-CM

## 2014-09-21 NOTE — Telephone Encounter (Signed)
Message copied by Tania Ade on Wed Sep 21, 2014 12:49 PM ------      Message from: Ladell Pier      Created: Mon Sep 19, 2014  4:31 PM       Please call patient, platelets are normal, d/c prednisone, check cbc 1 months ------

## 2014-09-21 NOTE — Telephone Encounter (Signed)
Notified of continued normal platelet count. Instructed to discontinue Prednisone and she will return 11/16 for labs.

## 2014-09-21 NOTE — Telephone Encounter (Signed)
Patient requested nurse inform Dr. Benay Spice that she probably has ringworm. Has appointment with PCP today.

## 2014-10-21 ENCOUNTER — Other Ambulatory Visit: Payer: Self-pay | Admitting: Oncology

## 2014-10-24 ENCOUNTER — Other Ambulatory Visit (HOSPITAL_BASED_OUTPATIENT_CLINIC_OR_DEPARTMENT_OTHER): Payer: BC Managed Care – PPO

## 2014-10-24 DIAGNOSIS — D696 Thrombocytopenia, unspecified: Secondary | ICD-10-CM

## 2014-10-24 DIAGNOSIS — D693 Immune thrombocytopenic purpura: Secondary | ICD-10-CM

## 2014-10-24 LAB — CBC WITH DIFFERENTIAL/PLATELET
BASO%: 0.9 % (ref 0.0–2.0)
Basophils Absolute: 0 10*3/uL (ref 0.0–0.1)
EOS%: 0.9 % (ref 0.0–7.0)
Eosinophils Absolute: 0 10*3/uL (ref 0.0–0.5)
HCT: 40.6 % (ref 34.8–46.6)
HGB: 13 g/dL (ref 11.6–15.9)
LYMPH%: 30.5 % (ref 14.0–49.7)
MCH: 29 pg (ref 25.1–34.0)
MCHC: 32 g/dL (ref 31.5–36.0)
MCV: 90.7 fL (ref 79.5–101.0)
MONO#: 0.4 10*3/uL (ref 0.1–0.9)
MONO%: 10.7 % (ref 0.0–14.0)
NEUT#: 2 10*3/uL (ref 1.5–6.5)
NEUT%: 57 % (ref 38.4–76.8)
Platelets: 129 10*3/uL — ABNORMAL LOW (ref 145–400)
RBC: 4.47 10*6/uL (ref 3.70–5.45)
RDW: 12.7 % (ref 11.2–14.5)
WBC: 3.5 10*3/uL — ABNORMAL LOW (ref 3.9–10.3)
lymph#: 1.1 10*3/uL (ref 0.9–3.3)

## 2014-11-04 NOTE — Progress Notes (Signed)
Prednisone d/c'd already w/last lab. Do not resume at this time per MD.

## 2014-11-10 ENCOUNTER — Other Ambulatory Visit (HOSPITAL_BASED_OUTPATIENT_CLINIC_OR_DEPARTMENT_OTHER): Payer: BC Managed Care – PPO

## 2014-11-10 ENCOUNTER — Ambulatory Visit (HOSPITAL_BASED_OUTPATIENT_CLINIC_OR_DEPARTMENT_OTHER): Payer: BC Managed Care – PPO | Admitting: Oncology

## 2014-11-10 VITALS — BP 129/85 | HR 70 | Temp 97.6°F | Resp 18 | Wt 134.8 lb

## 2014-11-10 DIAGNOSIS — Z23 Encounter for immunization: Secondary | ICD-10-CM

## 2014-11-10 DIAGNOSIS — D693 Immune thrombocytopenic purpura: Secondary | ICD-10-CM

## 2014-11-10 LAB — CBC WITH DIFFERENTIAL/PLATELET
BASO%: 0.9 % (ref 0.0–2.0)
Basophils Absolute: 0 10*3/uL (ref 0.0–0.1)
EOS%: 0.8 % (ref 0.0–7.0)
Eosinophils Absolute: 0 10*3/uL (ref 0.0–0.5)
HCT: 39.3 % (ref 34.8–46.6)
HGB: 12.5 g/dL (ref 11.6–15.9)
LYMPH%: 31.4 % (ref 14.0–49.7)
MCH: 28.7 pg (ref 25.1–34.0)
MCHC: 31.9 g/dL (ref 31.5–36.0)
MCV: 89.9 fL (ref 79.5–101.0)
MONO#: 0.4 10*3/uL (ref 0.1–0.9)
MONO%: 9.8 % (ref 0.0–14.0)
NEUT%: 57.1 % (ref 38.4–76.8)
NEUTROS ABS: 2.2 10*3/uL (ref 1.5–6.5)
Platelets: 107 10*3/uL — ABNORMAL LOW (ref 145–400)
RBC: 4.37 10*6/uL (ref 3.70–5.45)
RDW: 12.3 % (ref 11.2–14.5)
WBC: 3.8 10*3/uL — AB (ref 3.9–10.3)
lymph#: 1.2 10*3/uL (ref 0.9–3.3)

## 2014-11-10 MED ORDER — INFLUENZA VAC SPLIT QUAD 0.5 ML IM SUSY
0.5000 mL | PREFILLED_SYRINGE | Freq: Once | INTRAMUSCULAR | Status: AC
  Administered 2014-11-10: 0.5 mL via INTRAMUSCULAR
  Filled 2014-11-10: qty 0.5

## 2014-11-10 NOTE — Progress Notes (Signed)
  [place] OFFICE PROGRESS NOTE   Diagnosis: ITP  INTERVAL HISTORY:   She returns as scheduled. No bleeding or bruising. She has been maintained off of prednisone since 09/21/2014.  Objective:  Vital signs in last 24 hours:  Blood pressure 129/85, pulse 70, temperature 97.6 F (36.4 C), temperature source Oral, resp. rate 18, weight 134 lb 12.8 oz (61.145 kg).    HEENT: No thrush or bleeding Lymphatics: Pea-sized bilateral low cervical/scalene nodes Resp: Lungs clear bilaterally Cardio: Regular rate and rhythm GI: No hepatosplenomegaly, nontender Vascular: No leg edema   Lab Results:  Lab Results  Component Value Date   WBC 3.8* 11/10/2014   HGB 12.5 11/10/2014   HCT 39.3 11/10/2014   MCV 89.9 11/10/2014   PLT 107* 11/10/2014   NEUTROABS 2.2 11/10/2014     Medications: I have reviewed the patient's current medications.  Assessment/Plan: 1.Severe thrombocytopenia 06/27/2014 -clinical presentation consistent with a diagnosis of ITP, improved with prednisone   Status post a prednisone taper completed 09/21/2014 2. bruising secondary to #1 -resolved  3. history of vaginal bleeding secondary to #1 , resolved 4. Shotty lymphadenopathy-likely benign  5. history of ADHD  6. mild leukopenia-neutropenia on presentation with ITP 06/27/2014  7. G0 P0  8. low TSH, normal free T4-evaluate per primary M.D.  9. low level positive ANA   Disposition:  She has been tapered off of prednisone. The platelets are mildly decreased. The plan is to follow her with observation for a platelet count of greater than 30,000 and no bleeding. She will contact us for bruising or bleeding. Ms. Henigan will return for a platelet count in one month. She is scheduled for an office visit in 4 months.  She received an influenza vaccine today.  Betsy Coder, MD  11/10/2014  5:00 PM

## 2014-11-11 ENCOUNTER — Telehealth: Payer: Self-pay | Admitting: Oncology

## 2014-11-11 NOTE — Telephone Encounter (Signed)
, °

## 2014-12-12 ENCOUNTER — Telehealth: Payer: Self-pay | Admitting: *Deleted

## 2014-12-12 ENCOUNTER — Other Ambulatory Visit (HOSPITAL_BASED_OUTPATIENT_CLINIC_OR_DEPARTMENT_OTHER): Payer: BLUE CROSS/BLUE SHIELD

## 2014-12-12 DIAGNOSIS — D693 Immune thrombocytopenic purpura: Secondary | ICD-10-CM

## 2014-12-12 DIAGNOSIS — D696 Thrombocytopenia, unspecified: Secondary | ICD-10-CM

## 2014-12-12 LAB — CBC WITH DIFFERENTIAL/PLATELET
BASO%: 0.3 % (ref 0.0–2.0)
Basophils Absolute: 0 10*3/uL (ref 0.0–0.1)
EOS ABS: 0 10*3/uL (ref 0.0–0.5)
EOS%: 1.1 % (ref 0.0–7.0)
HCT: 38.6 % (ref 34.8–46.6)
HEMOGLOBIN: 12.8 g/dL (ref 11.6–15.9)
LYMPH%: 31.3 % (ref 14.0–49.7)
MCH: 29.2 pg (ref 25.1–34.0)
MCHC: 33.2 g/dL (ref 31.5–36.0)
MCV: 88.1 fL (ref 79.5–101.0)
MONO#: 0.4 10*3/uL (ref 0.1–0.9)
MONO%: 10.7 % (ref 0.0–14.0)
NEUT#: 2 10*3/uL (ref 1.5–6.5)
NEUT%: 56.6 % (ref 38.4–76.8)
Platelets: 51 10*3/uL — ABNORMAL LOW (ref 145–400)
RBC: 4.38 10*6/uL (ref 3.70–5.45)
RDW: 12 % (ref 11.2–14.5)
WBC: 3.6 10*3/uL — ABNORMAL LOW (ref 3.9–10.3)
lymph#: 1.1 10*3/uL (ref 0.9–3.3)

## 2014-12-12 NOTE — Telephone Encounter (Signed)
Left VM X 2 this morning requesting a lab appointment due to urgent care visit this weekend. Called back and left appointment on her VM.

## 2014-12-12 NOTE — Telephone Encounter (Addendum)
Per Dr. Benay Spice : Observation for now. Recheck counts in 2 weeks. Patient notified and agrees to plan. POF sent to scheduler.

## 2014-12-12 NOTE — Telephone Encounter (Signed)
Platelet count 51,000 (MD note said observe for >30,000 and no bleeding). Spoke with patient, she reports several scatter bruises on her legs, but no obvious bleeding. Will review with MD and call back.

## 2014-12-15 ENCOUNTER — Telehealth: Payer: Self-pay | Admitting: Oncology

## 2014-12-15 NOTE — Telephone Encounter (Signed)
LM to confirm appt for Jan. °

## 2014-12-27 ENCOUNTER — Other Ambulatory Visit: Payer: BLUE CROSS/BLUE SHIELD

## 2014-12-27 ENCOUNTER — Other Ambulatory Visit (HOSPITAL_BASED_OUTPATIENT_CLINIC_OR_DEPARTMENT_OTHER): Payer: BLUE CROSS/BLUE SHIELD

## 2014-12-27 DIAGNOSIS — D693 Immune thrombocytopenic purpura: Secondary | ICD-10-CM

## 2014-12-27 LAB — CBC WITH DIFFERENTIAL/PLATELET
BASO%: 0.8 % (ref 0.0–2.0)
Basophils Absolute: 0 10*3/uL (ref 0.0–0.1)
EOS ABS: 0 10*3/uL (ref 0.0–0.5)
EOS%: 1.2 % (ref 0.0–7.0)
HCT: 41.2 % (ref 34.8–46.6)
HGB: 13.1 g/dL (ref 11.6–15.9)
LYMPH%: 31.2 % (ref 14.0–49.7)
MCH: 28.4 pg (ref 25.1–34.0)
MCHC: 31.7 g/dL (ref 31.5–36.0)
MCV: 89.4 fL (ref 79.5–101.0)
MONO#: 0.4 10*3/uL (ref 0.1–0.9)
MONO%: 10.1 % (ref 0.0–14.0)
NEUT%: 56.7 % (ref 38.4–76.8)
NEUTROS ABS: 2.2 10*3/uL (ref 1.5–6.5)
PLATELETS: 56 10*3/uL — AB (ref 145–400)
RBC: 4.61 10*6/uL (ref 3.70–5.45)
RDW: 12.3 % (ref 11.2–14.5)
WBC: 3.9 10*3/uL (ref 3.9–10.3)
lymph#: 1.2 10*3/uL (ref 0.9–3.3)

## 2014-12-28 ENCOUNTER — Telehealth: Payer: Self-pay | Admitting: *Deleted

## 2014-12-28 NOTE — Telephone Encounter (Signed)
Per Dr. Benay Spice; left voice message that platelets are stable and we will check cbc in 6 weeks; call for bruising/bleeding.  Any questions contact office.

## 2014-12-28 NOTE — Telephone Encounter (Signed)
-----   Message from Ladell Pier, MD sent at 12/27/2014  5:40 PM EST ----- Please call patient, platelets are stable, check cbc 6 weeks, call for bruising or bleeding

## 2015-02-07 ENCOUNTER — Telehealth: Payer: Self-pay | Admitting: *Deleted

## 2015-02-07 NOTE — Telephone Encounter (Signed)
Per Dr. Benay Spice; left voice message on known voicemail that CBC is stable; f/u appt with Dr. Benay Spice is 03/14/15 and to f/u with gyn for any vaginal bleeding.  Pt instructed to call office is any questions re: information.

## 2015-03-14 ENCOUNTER — Telehealth: Payer: Self-pay | Admitting: Oncology

## 2015-03-14 ENCOUNTER — Other Ambulatory Visit (HOSPITAL_BASED_OUTPATIENT_CLINIC_OR_DEPARTMENT_OTHER): Payer: BLUE CROSS/BLUE SHIELD

## 2015-03-14 ENCOUNTER — Ambulatory Visit (HOSPITAL_BASED_OUTPATIENT_CLINIC_OR_DEPARTMENT_OTHER): Payer: BLUE CROSS/BLUE SHIELD | Admitting: Oncology

## 2015-03-14 VITALS — BP 124/77 | HR 95 | Temp 97.8°F | Resp 18 | Ht 66.0 in | Wt 132.6 lb

## 2015-03-14 DIAGNOSIS — D693 Immune thrombocytopenic purpura: Secondary | ICD-10-CM

## 2015-03-14 LAB — CBC WITH DIFFERENTIAL/PLATELET
BASO%: 1 % (ref 0.0–2.0)
Basophils Absolute: 0 10*3/uL (ref 0.0–0.1)
EOS ABS: 0 10*3/uL (ref 0.0–0.5)
EOS%: 0.7 % (ref 0.0–7.0)
HEMATOCRIT: 40.3 % (ref 34.8–46.6)
HEMOGLOBIN: 12.8 g/dL (ref 11.6–15.9)
LYMPH%: 34.3 % (ref 14.0–49.7)
MCH: 27.9 pg (ref 25.1–34.0)
MCHC: 31.9 g/dL (ref 31.5–36.0)
MCV: 87.4 fL (ref 79.5–101.0)
MONO#: 0.4 10*3/uL (ref 0.1–0.9)
MONO%: 11.3 % (ref 0.0–14.0)
NEUT#: 1.8 10*3/uL (ref 1.5–6.5)
NEUT%: 52.7 % (ref 38.4–76.8)
PLATELETS: 64 10*3/uL — AB (ref 145–400)
RBC: 4.61 10*6/uL (ref 3.70–5.45)
RDW: 12.2 % (ref 11.2–14.5)
WBC: 3.4 10*3/uL — ABNORMAL LOW (ref 3.9–10.3)
lymph#: 1.2 10*3/uL (ref 0.9–3.3)

## 2015-03-14 NOTE — Telephone Encounter (Signed)
Gave avs & calendar for July/November.

## 2015-03-14 NOTE — Progress Notes (Signed)
  [place] OFFICE PROGRESS NOTE   Diagnosis: ITP  INTERVAL HISTORY:   [name] returns as scheduled. She had an episode of vaginal bleeding. She saw her gynecologist and the bleeding resolved after she took "progesterone ". No other bleeding. She complains of arthralgias at several joints including the right knee and right thumb. She plans to see Dr. Orland Mustard later this week.  Objective:  Vital signs in last 24 hours:  Blood pressure 124/77, pulse 95, temperature 97.8 F (36.6 C), temperature source Oral, resp. rate 18, height 5\' 6"  (1.676 m), weight 132 lb 9.6 oz (60.147 kg), SpO2 99 %.    Lymphatics: "Shotty "bilateral low cervical/scalene and axillary nodes. Resp: Lungs clear bilaterally Cardio: Regular rate and rhythm GI: No hepato-splenomegaly Vascular: No leg edema Musculoskeletal: No erythema or edema at the right thumb or right knee  Skin: No ecchymoses   Lab Results:  Lab Results  Component Value Date   WBC 3.4* 03/14/2015   HGB 12.8 03/14/2015   HCT 40.3 03/14/2015   MCV 87.4 03/14/2015   PLT 64* 03/14/2015   NEUTROABS 1.8 03/14/2015   02/17/2015 at [facility]-heme of 12.4, white count 4.6, platelets 62,000   Medications: I have reviewed the patient's current medications.  Assessment/Plan: 1.Severe thrombocytopenia 06/27/2014 -clinical presentation consistent with a diagnosis of ITP, improved with prednisone   Status post a prednisone taper completed 09/21/2014 2. bruising secondary to #1 -resolved  3. history of vaginal bleeding secondary to #1 , resolved 4. Shotty lymphadenopathy-likely benign  5. history of ADHD  6. mild leukopenia-neutropenia on presentation with ITP 06/27/2014  7. G0 P0  8. low TSH, normal free T4-evaluate per primary M.D.  9. low level positive ANA     Disposition:  The platelet count is stable. There is no indication for treating the ITP at present. She will return for a CBC in 3 months and an  office visit in 6 months. She will contact us for spontaneous bleeding or bruising.  She will see Dr. Orland Mustard to discuss the arthralgias. It is possible she is developing a collagen vascular disease with associated ITP.  Betsy Coder, MD  03/14/2015  5:00 PM

## 2015-03-24 ENCOUNTER — Other Ambulatory Visit: Payer: Self-pay | Admitting: Family Medicine

## 2015-03-24 DIAGNOSIS — E0590 Thyrotoxicosis, unspecified without thyrotoxic crisis or storm: Secondary | ICD-10-CM

## 2015-03-29 ENCOUNTER — Ambulatory Visit
Admission: RE | Admit: 2015-03-29 | Discharge: 2015-03-29 | Disposition: A | Discharge status: Home / Self Care | Payer: BLUE CROSS/BLUE SHIELD | Source: Ambulatory Visit | Attending: Family Medicine | Admitting: Family Medicine

## 2015-03-29 DIAGNOSIS — E0590 Thyrotoxicosis, unspecified without thyrotoxic crisis or storm: Secondary | ICD-10-CM

## 2015-04-10 ENCOUNTER — Other Ambulatory Visit (HOSPITAL_COMMUNITY): Payer: Self-pay | Admitting: Family Medicine

## 2015-04-10 DIAGNOSIS — E041 Nontoxic single thyroid nodule: Secondary | ICD-10-CM

## 2015-04-10 DIAGNOSIS — E0590 Thyrotoxicosis, unspecified without thyrotoxic crisis or storm: Secondary | ICD-10-CM

## 2015-04-14 ENCOUNTER — Encounter (HOSPITAL_COMMUNITY): Payer: Self-pay | Admitting: Emergency Medicine

## 2015-04-14 ENCOUNTER — Emergency Department (HOSPITAL_COMMUNITY)
Admission: EM | Admit: 2015-04-14 | Discharge: 2015-04-14 | Disposition: A | Discharge status: Home / Self Care | Payer: BLUE CROSS/BLUE SHIELD | Attending: Emergency Medicine | Admitting: Emergency Medicine

## 2015-04-14 ENCOUNTER — Emergency Department (HOSPITAL_COMMUNITY): Payer: BLUE CROSS/BLUE SHIELD

## 2015-04-14 DIAGNOSIS — Z79899 Other long term (current) drug therapy: Secondary | ICD-10-CM | POA: Diagnosis not present

## 2015-04-14 DIAGNOSIS — S82892A Other fracture of left lower leg, initial encounter for closed fracture: Secondary | ICD-10-CM | POA: Diagnosis not present

## 2015-04-14 DIAGNOSIS — S99912A Unspecified injury of left ankle, initial encounter: Secondary | ICD-10-CM | POA: Diagnosis present

## 2015-04-14 DIAGNOSIS — Y998 Other external cause status: Secondary | ICD-10-CM | POA: Diagnosis not present

## 2015-04-14 DIAGNOSIS — Z8739 Personal history of other diseases of the musculoskeletal system and connective tissue: Secondary | ICD-10-CM | POA: Insufficient documentation

## 2015-04-14 DIAGNOSIS — Y92310 Basketball court as the place of occurrence of the external cause: Secondary | ICD-10-CM | POA: Diagnosis not present

## 2015-04-14 DIAGNOSIS — F909 Attention-deficit hyperactivity disorder, unspecified type: Secondary | ICD-10-CM | POA: Diagnosis not present

## 2015-04-14 DIAGNOSIS — X58XXXA Exposure to other specified factors, initial encounter: Secondary | ICD-10-CM | POA: Diagnosis not present

## 2015-04-14 DIAGNOSIS — Y9367 Activity, basketball: Secondary | ICD-10-CM | POA: Insufficient documentation

## 2015-04-14 HISTORY — DX: Fracture of unspecified parts of lumbosacral spine and pelvis, initial encounter for closed fracture: S32.9XXA

## 2015-04-14 MED ORDER — IBUPROFEN 800 MG PO TABS: 800.0000 mg | ORAL_TABLET | Freq: Three times a day (TID) | ORAL | Status: DC | PRN

## 2015-04-14 MED ORDER — HYDROCODONE-ACETAMINOPHEN 5-325 MG PO TABS: 1.0000 | ORAL_TABLET | Freq: Four times a day (QID) | ORAL | Status: DC | PRN

## 2015-04-14 NOTE — ED Provider Notes (Signed)
CSN: [id number]     Arrival date & time 04/14/15  2025 History  This chart was scribed for non-physician practitioner Irena Cords, PA, working with Francine Graven, DO, by Eustaquio Maize, ED Scribe. This patient was seen in room TR09C/TR09C and the patient's care was started at 9:13 PM.     Chief Complaint  Patient presents with  . Ankle Pain   The history is provided by the patient. No language interpreter was used.     HPI Comments: Ashley. Key is a 49 y.o. female who presents to the Emergency Department complaining of left ankle pain that occurred earlier today. Pt reports that she was playing basketball with her son and twisted her ankle, inverting it. She also complains of increased swelling to the area. She has not taken anything for the pain. Pt denies any other symptoms. She mentions that 15 years ago she fractured a bone in her left ankle.   Past Medical History  Diagnosis Date  . Bunion   . Arch pain   . ADHD (attention deficit hyperactivity disorder)   . Pelvic fracture    Past Surgical History  Procedure Laterality Date  . Foot surgery     Family History  Problem Relation Age of Onset  . CAD Mother   . Stroke Father   . CAD Brother    History  Substance Use Topics  . Smoking status: Never Smoker   . Smokeless tobacco: Not on file  . Alcohol Use: Yes   OB History    No data available     Review of Systems  A complete 10 system review of systems was obtained and all systems are negative except as noted in the HPI and PMH.    Allergies  Review of patient's allergies indicates no known allergies.  Home Medications   Prior to Admission medications   Medication Sig Start Date End Date Taking? Authorizing Provider  amphetamine-dextroamphetamine (ADDERALL XR) 20 MG 24 hr capsule Take 20 mg by mouth daily.    Historical Provider, MD  CALCIUM-VITAMIN D PO Take 1 tablet by mouth 2 (two) times daily.    Historical Provider, MD  estradiol-norethindrone  [facility]) 0.05-0.14 MG/DAY Place 1 patch onto the skin 2 (two) times a week. 02/07/14   Historical Provider, MD  Multiple Vitamins-Minerals (MULTIVITAMIN PO) Take 1 tablet by mouth daily.    Historical Provider, MD  Omega-3 Fatty Acids (FISH OIL) 1200 MG CAPS Take 1,200 mg by mouth daily.    Historical Provider, MD  sertraline (ZOLOFT) 25 MG tablet Take 25 mg by mouth at bedtime.    Historical Provider, MD  vitamin B-12 (CYANOCOBALAMIN) 1000 MCG tablet Take 1,000 mcg by mouth daily.    Historical Provider, MD   Triage Vitals: BP 137/95 mmHg  Pulse 70  Temp(Src) 98.3 F (36.8 C) (Oral)  Resp 16  Ht 5\' 6"  (1.676 m)  Wt 130 lb (58.968 kg)  BMI 20.99 kg/m2  SpO2 100%   Physical Exam  Constitutional: She is oriented to person, place, and time. She appears well-developed and well-nourished.  HENT:  Head: Normocephalic and atraumatic.  Cardiovascular: Normal rate.   Pulmonary/Chest: Effort normal.  Musculoskeletal: Normal range of motion.  Swelling and bruising to lateral and anterior left ankle.  No tenderness to palpation.   Neurological: She is alert and oriented to person, place, and time.  Skin: Skin is warm and dry.  Psychiatric: She has a normal mood and affect. Her behavior is normal.  Nursing note and vitals  reviewed.   ED Course  Procedures (including critical care time)  DIAGNOSTIC STUDIES: Oxygen Saturation is 100% on RA, normal by my interpretation.    COORDINATION OF CARE: 9:17 PM-Discussed treatment plan which includes DG L Ankle with pt at bedside and pt agreed to plan.   Imaging Review Dg Ankle Complete Left  04/14/2015   CLINICAL DATA:  Rolled ankle while playing basketball at 1800 hours.  EXAM: LEFT ANKLE COMPLETE - 3+ VIEW  COMPARISON:  None.  FINDINGS: Crescentic calcification projects lateral to the foot on the AP view, not well localized on the lateral and oblique radiograph. The ankle mortise appears congruent and the tibiofibular syndesmosis intact. No  destructive bony lesions. Lateral ankle soft tissue swelling without subcutaneous gas or radiopaque foreign bodies.  IMPRESSION: Crescentic calcification projecting lateral to the foot concerning for avulsion injury without donor site. No dislocation.   Electronically Signed   By: Elon Alas   On: 04/14/2015 22:02     Patient will be treated with cam walker and crutches.  She will have a follow-up with Dr. Sharol Given told ice and elevate the ankle.  Patient agrees the plan and all questions were answered  I personally performed the services described in this documentation, which was scribed in my presence. The recorded information has been reviewed and is accurate.     Ashley Heading, PA-C 04/16/15 [place], DO 04/16/15 1622

## 2015-04-14 NOTE — Discharge Instructions (Signed)
Return here as needed. Follow up with the orthopedist. Ice and elevate

## 2015-04-14 NOTE — ED Notes (Addendum)
C/o "twisting" L ankle while playing basketball with her son 2 hours ago.  C/o L ankle pain  (with movement) and swelling. CMS intact.

## 2015-04-18 ENCOUNTER — Encounter (HOSPITAL_COMMUNITY)
Admission: RE | Admit: 2015-04-18 | Discharge: 2015-04-18 | Disposition: A | Discharge status: Home / Self Care | Payer: BLUE CROSS/BLUE SHIELD | Source: Ambulatory Visit | Attending: Family Medicine | Admitting: Family Medicine

## 2015-04-18 DIAGNOSIS — E0590 Thyrotoxicosis, unspecified without thyrotoxic crisis or storm: Secondary | ICD-10-CM | POA: Diagnosis not present

## 2015-04-18 DIAGNOSIS — E041 Nontoxic single thyroid nodule: Secondary | ICD-10-CM | POA: Diagnosis present

## 2015-04-18 MED ORDER — SODIUM IODIDE I 131 CAPSULE
13.2000 | Freq: Once | INTRAVENOUS | Status: AC | PRN
  Administered 2015-04-18: 13.2 via ORAL

## 2015-04-19 ENCOUNTER — Other Ambulatory Visit (HOSPITAL_COMMUNITY): Payer: Self-pay | Admitting: Family Medicine

## 2015-04-19 ENCOUNTER — Encounter (HOSPITAL_COMMUNITY)
Admission: RE | Admit: 2015-04-19 | Discharge: 2015-04-19 | Disposition: A | Discharge status: Home / Self Care | Payer: BLUE CROSS/BLUE SHIELD | Source: Ambulatory Visit | Attending: Family Medicine | Admitting: Family Medicine

## 2015-04-19 DIAGNOSIS — E0590 Thyrotoxicosis, unspecified without thyrotoxic crisis or storm: Secondary | ICD-10-CM | POA: Diagnosis not present

## 2015-04-19 DIAGNOSIS — E041 Nontoxic single thyroid nodule: Secondary | ICD-10-CM

## 2015-04-19 MED ORDER — SODIUM PERTECHNETATE TC 99M INJECTION
10.0000 | Freq: Once | INTRAVENOUS | Status: AC | PRN
Start: 2015-04-19 — End: 2015-04-19
  Administered 2015-04-19: 10 via INTRAVENOUS

## 2015-06-20 ENCOUNTER — Telehealth: Payer: Self-pay | Admitting: *Deleted

## 2015-06-20 ENCOUNTER — Telehealth: Payer: Self-pay | Admitting: Oncology

## 2015-06-20 ENCOUNTER — Other Ambulatory Visit (HOSPITAL_BASED_OUTPATIENT_CLINIC_OR_DEPARTMENT_OTHER): Payer: BLUE CROSS/BLUE SHIELD

## 2015-06-20 DIAGNOSIS — D693 Immune thrombocytopenic purpura: Secondary | ICD-10-CM

## 2015-06-20 LAB — CBC WITH DIFFERENTIAL/PLATELET
BASO%: 0.7 % (ref 0.0–2.0)
Basophils Absolute: 0 10*3/uL (ref 0.0–0.1)
EOS ABS: 0.1 10*3/uL (ref 0.0–0.5)
EOS%: 2.6 % (ref 0.0–7.0)
HCT: 38.2 % (ref 34.8–46.6)
HGB: 12.7 g/dL (ref 11.6–15.9)
LYMPH%: 26.7 % (ref 14.0–49.7)
MCH: 29 pg (ref 25.1–34.0)
MCHC: 33.2 g/dL (ref 31.5–36.0)
MCV: 87.3 fL (ref 79.5–101.0)
MONO#: 0.3 10*3/uL (ref 0.1–0.9)
MONO%: 9.5 % (ref 0.0–14.0)
NEUT#: 2.2 10*3/uL (ref 1.5–6.5)
NEUT%: 60.5 % (ref 38.4–76.8)
Platelets: 46 10*3/uL — ABNORMAL LOW (ref 145–400)
RBC: 4.37 10*6/uL (ref 3.70–5.45)
RDW: 13.4 % (ref 11.2–14.5)
WBC: 3.6 10*3/uL — ABNORMAL LOW (ref 3.9–10.3)
lymph#: 1 10*3/uL (ref 0.9–3.3)

## 2015-06-20 NOTE — Telephone Encounter (Signed)
Called pt with lab results: Stable, per Dr. Benay Spice. She voiced understanding. Informed her schedulers will call with appointment.

## 2015-06-20 NOTE — Telephone Encounter (Signed)
lvm for pt regarding to Sept appt.... °

## 2015-06-20 NOTE — Telephone Encounter (Signed)
-----   Message from Ladell Pier, MD sent at 06/20/2015  1:45 PM EDT ----- Please call patient, platlets stable, call for bleeding, check cbc 2 months

## 2015-06-27 ENCOUNTER — Other Ambulatory Visit: Payer: Self-pay | Admitting: Internal Medicine

## 2015-06-27 DIAGNOSIS — E0500 Thyrotoxicosis with diffuse goiter without thyrotoxic crisis or storm: Secondary | ICD-10-CM

## 2015-06-27 DIAGNOSIS — E0590 Thyrotoxicosis, unspecified without thyrotoxic crisis or storm: Secondary | ICD-10-CM

## 2015-07-11 ENCOUNTER — Encounter (HOSPITAL_COMMUNITY)
Admission: RE | Admit: 2015-07-11 | Discharge: 2015-07-11 | Disposition: A | Discharge status: Home / Self Care | Payer: BLUE CROSS/BLUE SHIELD | Source: Ambulatory Visit | Attending: Internal Medicine | Admitting: Internal Medicine

## 2015-07-11 DIAGNOSIS — E0590 Thyrotoxicosis, unspecified without thyrotoxic crisis or storm: Secondary | ICD-10-CM

## 2015-07-11 DIAGNOSIS — E0500 Thyrotoxicosis with diffuse goiter without thyrotoxic crisis or storm: Secondary | ICD-10-CM

## 2015-07-11 LAB — HCG, SERUM, QUALITATIVE: Preg, Serum: NEGATIVE

## 2015-07-11 MED ORDER — SODIUM IODIDE I 131 CAPSULE
15.0000 | Freq: Once | INTRAVENOUS | Status: AC | PRN
  Administered 2015-07-11: 15 via ORAL

## 2015-08-10 ENCOUNTER — Other Ambulatory Visit: Payer: Self-pay | Admitting: *Deleted

## 2015-08-10 DIAGNOSIS — D693 Immune thrombocytopenic purpura: Secondary | ICD-10-CM

## 2015-08-15 ENCOUNTER — Other Ambulatory Visit (HOSPITAL_BASED_OUTPATIENT_CLINIC_OR_DEPARTMENT_OTHER): Payer: BLUE CROSS/BLUE SHIELD

## 2015-08-15 DIAGNOSIS — D693 Immune thrombocytopenic purpura: Secondary | ICD-10-CM | POA: Diagnosis not present

## 2015-08-15 LAB — CBC & DIFF AND RETIC
BASO%: 0.3 % (ref 0.0–2.0)
Basophils Absolute: 0 10*3/uL (ref 0.0–0.1)
EOS%: 0.8 % (ref 0.0–7.0)
Eosinophils Absolute: 0 10*3/uL (ref 0.0–0.5)
HEMATOCRIT: 37.5 % (ref 34.8–46.6)
HGB: 12.6 g/dL (ref 11.6–15.9)
Immature Retic Fract: 2.5 % (ref 1.60–10.00)
LYMPH%: 17.5 % (ref 14.0–49.7)
MCH: 29.7 pg (ref 25.1–34.0)
MCHC: 33.6 g/dL (ref 31.5–36.0)
MCV: 88.4 fL (ref 79.5–101.0)
MONO#: 0.3 10*3/uL (ref 0.1–0.9)
MONO%: 8.6 % (ref 0.0–14.0)
NEUT%: 72.8 % (ref 38.4–76.8)
NEUTROS ABS: 2.7 10*3/uL (ref 1.5–6.5)
PLATELETS: 104 10*3/uL — AB (ref 145–400)
RBC: 4.24 10*6/uL (ref 3.70–5.45)
RDW: 12.7 % (ref 11.2–14.5)
RETIC CT ABS: 42.82 10*3/uL (ref 33.70–90.70)
Retic %: 1.01 % (ref 0.70–2.10)
WBC: 3.7 10*3/uL — AB (ref 3.9–10.3)
lymph#: 0.7 10*3/uL — ABNORMAL LOW (ref 0.9–3.3)
nRBC: 0 % (ref 0–0)

## 2015-08-16 ENCOUNTER — Telehealth: Payer: Self-pay | Admitting: *Deleted

## 2015-08-16 NOTE — Telephone Encounter (Signed)
Pt voices understanding of results and appreciated call

## 2015-08-16 NOTE — Telephone Encounter (Signed)
-----   Message from Ladell Pier, MD sent at 08/15/2015  9:29 PM EDT ----- Please call patient, platelets look good, f/u as scheduled

## 2015-09-19 ENCOUNTER — Encounter (HOSPITAL_COMMUNITY): Payer: Self-pay

## 2015-09-27 ENCOUNTER — Encounter (HOSPITAL_COMMUNITY): Payer: Self-pay | Admitting: Anesthesiology

## 2015-09-27 ENCOUNTER — Ambulatory Visit (HOSPITAL_COMMUNITY): Payer: BLUE CROSS/BLUE SHIELD | Admitting: Anesthesiology

## 2015-09-27 ENCOUNTER — Ambulatory Visit (HOSPITAL_COMMUNITY): Payer: BLUE CROSS/BLUE SHIELD

## 2015-09-27 ENCOUNTER — Ambulatory Visit (HOSPITAL_COMMUNITY)
Admission: RE | Admit: 2015-09-27 | Discharge: 2015-09-27 | Disposition: A | Discharge status: Home / Self Care | Payer: BLUE CROSS/BLUE SHIELD | Source: Ambulatory Visit | Attending: Obstetrics and Gynecology | Admitting: Obstetrics and Gynecology

## 2015-09-27 ENCOUNTER — Encounter (HOSPITAL_COMMUNITY)
Admission: RE | Disposition: A | Discharge status: Home / Self Care | Payer: Self-pay | Source: Ambulatory Visit | Attending: Obstetrics and Gynecology

## 2015-09-27 DIAGNOSIS — D693 Immune thrombocytopenic purpura: Secondary | ICD-10-CM | POA: Insufficient documentation

## 2015-09-27 DIAGNOSIS — N950 Postmenopausal bleeding: Secondary | ICD-10-CM | POA: Diagnosis not present

## 2015-09-27 DIAGNOSIS — E0500 Thyrotoxicosis with diffuse goiter without thyrotoxic crisis or storm: Secondary | ICD-10-CM | POA: Diagnosis not present

## 2015-09-27 DIAGNOSIS — R58 Hemorrhage, not elsewhere classified: Secondary | ICD-10-CM

## 2015-09-27 HISTORY — DX: Immune thrombocytopenic purpura: D69.3

## 2015-09-27 HISTORY — DX: Pneumonia, unspecified organism: J18.9

## 2015-09-27 HISTORY — DX: Headache: R51

## 2015-09-27 HISTORY — DX: Unspecified osteoarthritis, unspecified site: M19.90

## 2015-09-27 HISTORY — DX: Headache, unspecified: R51.9

## 2015-09-27 HISTORY — PX: DILITATION & CURRETTAGE/HYSTROSCOPY WITH HYDROTHERMAL ABLATION: SHX5570

## 2015-09-27 HISTORY — DX: Disease of blood and blood-forming organs, unspecified: D75.9

## 2015-09-27 LAB — CBC
HEMATOCRIT: 37.2 % (ref 36.0–46.0)
HEMOGLOBIN: 12.2 g/dL (ref 12.0–15.0)
MCH: 29 pg (ref 26.0–34.0)
MCHC: 32.8 g/dL (ref 30.0–36.0)
MCV: 88.4 fL (ref 78.0–100.0)
Platelets: 92 10*3/uL — ABNORMAL LOW (ref 150–400)
RBC: 4.21 MIL/uL (ref 3.87–5.11)
RDW: 13 % (ref 11.5–15.5)
WBC: 3.8 10*3/uL — AB (ref 4.0–10.5)

## 2015-09-27 SURGERY — DILATATION & CURETTAGE/HYSTEROSCOPY WITH HYDROTHERMAL ABLATION
Anesthesia: Monitor Anesthesia Care | Site: Vagina

## 2015-09-27 MED ORDER — MIDAZOLAM HCL 2 MG/2ML IJ SOLN
INTRAMUSCULAR | Status: AC
  Filled 2015-09-27: qty 4

## 2015-09-27 MED ORDER — OXYCODONE-ACETAMINOPHEN 5-325 MG PO TABS
ORAL_TABLET | ORAL | Status: AC
  Administered 2015-09-27: 1 via ORAL
  Filled 2015-09-27: qty 1

## 2015-09-27 MED ORDER — HYDROMORPHONE HCL 1 MG/ML IJ SOLN
INTRAMUSCULAR | Status: AC
  Filled 2015-09-27: qty 1

## 2015-09-27 MED ORDER — DEXAMETHASONE SODIUM PHOSPHATE 4 MG/ML IJ SOLN
INTRAMUSCULAR | Status: DC | PRN
  Administered 2015-09-27: 4 mg via INTRAVENOUS

## 2015-09-27 MED ORDER — HYDROMORPHONE HCL 1 MG/ML IJ SOLN
0.2500 mg | INTRAMUSCULAR | Status: DC | PRN
  Administered 2015-09-27: 0.25 mg via INTRAVENOUS

## 2015-09-27 MED ORDER — MIDAZOLAM HCL 2 MG/2ML IJ SOLN
INTRAMUSCULAR | Status: DC | PRN
  Administered 2015-09-27: 1 mg via INTRAVENOUS

## 2015-09-27 MED ORDER — LIDOCAINE HCL (CARDIAC) 20 MG/ML IV SOLN
INTRAVENOUS | Status: DC | PRN
  Administered 2015-09-27: 60 mg via INTRAVENOUS

## 2015-09-27 MED ORDER — LACTATED RINGERS IV SOLN
INTRAVENOUS | Status: DC
  Administered 2015-09-27: 15:00:00 via INTRAVENOUS

## 2015-09-27 MED ORDER — SCOPOLAMINE 1 MG/3DAYS TD PT72
1.0000 | MEDICATED_PATCH | Freq: Once | TRANSDERMAL | Status: DC
  Administered 2015-09-27: 1.5 mg via TRANSDERMAL

## 2015-09-27 MED ORDER — LIDOCAINE HCL (CARDIAC) 20 MG/ML IV SOLN
INTRAVENOUS | Status: AC
Start: 2015-09-27 — End: 2015-09-27
  Filled 2015-09-27: qty 5

## 2015-09-27 MED ORDER — FENTANYL CITRATE (PF) 250 MCG/5ML IJ SOLN
INTRAMUSCULAR | Status: AC
  Filled 2015-09-27: qty 25

## 2015-09-27 MED ORDER — LIDOCAINE HCL 2 % IJ SOLN
INTRAMUSCULAR | Status: AC
  Filled 2015-09-27: qty 20

## 2015-09-27 MED ORDER — DEXTROSE 5 % IV SOLN
2.0000 g | INTRAVENOUS | Status: DC
  Filled 2015-09-27: qty 2

## 2015-09-27 MED ORDER — OXYCODONE-ACETAMINOPHEN 5-325 MG PO TABS: 2.0000 | ORAL_TABLET | ORAL | Status: DC | PRN

## 2015-09-27 MED ORDER — SILVER NITRATE-POT NITRATE 75-25 % EX MISC
CUTANEOUS | Status: AC
  Filled 2015-09-27: qty 1

## 2015-09-27 MED ORDER — LIDOCAINE HCL 2 % IJ SOLN
INTRAMUSCULAR | Status: DC | PRN
  Administered 2015-09-27: 12 mL

## 2015-09-27 MED ORDER — OXYCODONE-ACETAMINOPHEN 5-325 MG PO TABS
1.0000 | ORAL_TABLET | Freq: Once | ORAL | Status: AC
  Administered 2015-09-27: 1 via ORAL

## 2015-09-27 MED ORDER — FENTANYL CITRATE (PF) 250 MCG/5ML IJ SOLN
INTRAMUSCULAR | Status: DC | PRN
  Administered 2015-09-27: 100 ug via INTRAVENOUS

## 2015-09-27 MED ORDER — MEPERIDINE HCL 25 MG/ML IJ SOLN: 6.2500 mg | INTRAMUSCULAR | Status: DC | PRN

## 2015-09-27 MED ORDER — CEFOTETAN DISODIUM 2 G IJ SOLR
2.0000 g | Freq: Once | INTRAMUSCULAR | Status: AC
  Administered 2015-09-27: 2 g via INTRAVENOUS
  Filled 2015-09-27: qty 2

## 2015-09-27 MED ORDER — IBUPROFEN 600 MG PO TABS
600.0000 mg | ORAL_TABLET | Freq: Four times a day (QID) | ORAL | Status: DC | PRN
Start: 2015-09-27 — End: 2017-06-30

## 2015-09-27 MED ORDER — PROPOFOL 500 MG/50ML IV EMUL
INTRAVENOUS | Status: DC | PRN
  Administered 2015-09-27: 75 ug/kg/min via INTRAVENOUS

## 2015-09-27 MED ORDER — PROPOFOL 10 MG/ML IV BOLUS
INTRAVENOUS | Status: DC | PRN
  Administered 2015-09-27: 30 mg via INTRAVENOUS

## 2015-09-27 MED ORDER — LACTATED RINGERS IV SOLN: INTRAVENOUS | Status: DC

## 2015-09-27 MED ORDER — SODIUM CHLORIDE 0.9 % IR SOLN
Status: DC | PRN
  Administered 2015-09-27: 3000 mL

## 2015-09-27 MED ORDER — ONDANSETRON HCL 4 MG/2ML IJ SOLN
INTRAMUSCULAR | Status: AC
  Filled 2015-09-27: qty 2

## 2015-09-27 MED ORDER — DEXAMETHASONE SODIUM PHOSPHATE 4 MG/ML IJ SOLN
INTRAMUSCULAR | Status: AC
Start: 2015-09-27 — End: 2015-09-27
  Filled 2015-09-27: qty 1

## 2015-09-27 MED ORDER — PROPOFOL 10 MG/ML IV BOLUS
INTRAVENOUS | Status: AC
  Filled 2015-09-27: qty 20

## 2015-09-27 MED ORDER — ONDANSETRON HCL 4 MG/2ML IJ SOLN
INTRAMUSCULAR | Status: DC | PRN
  Administered 2015-09-27: 4 mg via INTRAVENOUS

## 2015-09-27 MED ORDER — SILVER NITRATE-POT NITRATE 75-25 % EX MISC
CUTANEOUS | Status: DC | PRN
  Administered 2015-09-27: 2 via TOPICAL

## 2015-09-27 MED ORDER — SCOPOLAMINE 1 MG/3DAYS TD PT72
MEDICATED_PATCH | TRANSDERMAL | Status: DC
Start: 2015-09-27 — End: 2015-09-27
  Administered 2015-09-27: 1.5 mg via TRANSDERMAL
  Filled 2015-09-27: qty 1

## 2015-09-27 MED ORDER — PROMETHAZINE HCL 25 MG/ML IJ SOLN: 6.2500 mg | INTRAMUSCULAR | Status: DC | PRN

## 2015-09-27 SURGICAL SUPPLY — 14 items
CANISTER SUCT 3000ML (MISCELLANEOUS) ×2 IMPLANT
CATH ROBINSON RED A/P 16FR (CATHETERS) ×2 IMPLANT
CLOTH BEACON ORANGE TIMEOUT ST (SAFETY) ×2 IMPLANT
CONTAINER PREFILL 10% NBF 60ML (FORM) ×4 IMPLANT
DILATOR CANAL MILEX (MISCELLANEOUS) ×2 IMPLANT
GLOVE BIO SURGEON STRL SZ7 (GLOVE) ×2 IMPLANT
GLOVE BIOGEL PI IND STRL 7.0 (GLOVE) ×1 IMPLANT
GLOVE BIOGEL PI INDICATOR 7.0 (GLOVE) ×1
GOWN STRL REUS W/TWL LRG LVL3 (GOWN DISPOSABLE) ×6 IMPLANT
PACK VAGINAL MINOR WOMEN LF (CUSTOM PROCEDURE TRAY) ×2 IMPLANT
PAD OB MATERNITY 4.3X12.25 (PERSONAL CARE ITEMS) ×2 IMPLANT
SET GENESYS HTA PROCERVA (MISCELLANEOUS) ×2 IMPLANT
TOWEL OR 17X24 6PK STRL BLUE (TOWEL DISPOSABLE) ×4 IMPLANT
WATER STERILE IRR 1000ML POUR (IV SOLUTION) ×2 IMPLANT

## 2015-09-27 NOTE — Anesthesia Postprocedure Evaluation (Signed)
  Anesthesia Post-op Note  Patient: Ashley Key  Procedure(s) Performed: Procedure(s) with comments: DILATATION & CURETTAGE/HYSTEROSCOPY WITH HYDROTHERMAL ABLATION (N/A) - ultrasound guidance   Patient Location: PACU  Anesthesia Type:MAC  Level of Consciousness: awake and alert   Airway and Oxygen Therapy: Patient Spontanous Breathing  Post-op Pain: mild  Post-op Assessment: Post-op Vital signs reviewed and Patient's Cardiovascular Status Stable              Post-op Vital Signs: Reviewed and stable  Last Vitals:  Filed Vitals:   09/27/15 1820  BP: 140/84  Pulse:   Temp: 36.7 C  Resp: 18    Complications: No apparent anesthesia complications

## 2015-09-27 NOTE — Anesthesia Preprocedure Evaluation (Addendum)
Anesthesia Evaluation  Patient identified by MRN, date of birth, ID band Patient awake    Reviewed: Allergy & Precautions, NPO status , Patient's Chart, lab work & pertinent test results  Airway Mallampati: I  TM Distance: >3 FB Neck ROM: Full    Dental  (+) Teeth Intact   Pulmonary    breath sounds clear to auscultation       Cardiovascular negative cardio ROS   Rhythm:Regular Rate:Normal     Neuro/Psych  Headaches, PSYCHIATRIC DISORDERS    GI/Hepatic negative GI ROS, Neg liver ROS,   Endo/Other  negative endocrine ROS  Renal/GU negative Renal ROS  negative genitourinary   Musculoskeletal  (+) Arthritis ,   Abdominal   Peds negative pediatric ROS (+)  Hematology negative hematology ROS (+)   Anesthesia Other Findings   Reproductive/Obstetrics negative OB ROS                            Lab Results  Component Value Date   WBC 3.7* 08/15/2015   HGB 12.6 08/15/2015   HCT 37.5 08/15/2015   MCV 88.4 08/15/2015   PLT 104* 08/15/2015   Lab Results  Component Value Date   INR 1.10 06/27/2014     Anesthesia Physical Anesthesia Plan  ASA: II  Anesthesia Plan: MAC   Post-op Pain Management:    Induction: Intravenous  Airway Management Planned:   Additional Equipment:   Intra-op Plan:   Post-operative Plan: Extubation in OR  Informed Consent: I have reviewed the patients History and Physical, chart, labs and discussed the procedure including the risks, benefits and alternatives for the proposed anesthesia with the patient or authorized representative who has indicated his/her understanding and acceptance.   Dental advisory given  Plan Discussed with: CRNA  Anesthesia Plan Comments: (Patient prefers MAC with block, she is good candidate for anesthetic plan. Will convert to GA if necessary, patient consented. )       Anesthesia Quick Evaluation

## 2015-09-27 NOTE — Interval H&P Note (Signed)
History and Physical Interval Note:  09/27/2015 2:27 PM  BREEZE BERRINGER  has presented today for surgery, with the diagnosis of N95.0  Postmenopausal Bleeding  The various methods of treatment have been discussed with the patient and family. After consideration of risks, benefits and other options for treatment, the patient has consented to  Procedure(s) with comments: DILATATION & CURETTAGE/HYSTEROSCOPY WITH HYDROTHERMAL ABLATION (N/A) - ultrasound guidance  as a surgical intervention .  The patient's history has been reviewed, patient examined, no change in status, stable for surgery.  I have reviewed the patient's chart and labs.  Questions were answered to the patient's satisfaction.     Simona Huh, Briscoe Daniello

## 2015-09-27 NOTE — Op Note (Signed)
NAMEVIVIANA, Ashley Key                 ACCOUNT NO.:  [id number]  MEDICAL RECORD NO.:  [id number]  LOCATION:  [facility]                          FACILITY:  [facility]  PHYSICIAN:  Jola Schmidt, MD   DATE OF BIRTH:  [birth year]  DATE OF PROCEDURE:  09/27/2015 DATE OF DISCHARGE:  09/27/2015                              OPERATIVE REPORT   PREOPERATIVE DIAGNOSES:  Postmenopausal bleeding and personal history of idiopathic thrombocytopenic purpura.  POSTOPERATIVE DIAGNOSES:  Postmenopausal bleeding and personal history of idiopathic thrombocytopenic purpura.  PROCEDURE:  Hysteroscopy, dilation and curettage with ultrasound guidance, hydrothermal ablation.  SURGEON:  Jola Schmidt, MD  ASSISTANT:  None.  ANESTHESIA:  Paracervical block and MAC.  ESTIMATED BLOOD LOSS:  20.  URINE OUTPUT:  Prior to procedure was 75 mL.  IV FLUIDS:  900.  DRAINS:  No drains.  LOCAL:  2% lidocaine 12 mL.  SPECIMENS:  Endometrial curettings.  Disposition of specimen to pathology.  COUNTS:  Correct.  DISPOSITION:  PACU, hemodynamically stable.  COMPLICATIONS:  None.  FINDINGS:  Long tubular lower uterine segment, long cervix, endometrial cavity without any masses or any abnormal appearing tissue. 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  DESCRIPTION OF PROCEDURE:  The patient was identified in the holding area.  She was then taken to the operating room, underwent IV sedation, was placed in the dorsal lithotomy position and prepped and draped in a normal sterile fashion.  A Graves speculum was inserted into the vagina.  The cervix was visualized.  Prior to prepping the patient, I did do a bimanual exam, which revealed an anteverted uterus.  The anterior lip of the cervix was injected with 2% lidocaine.  A single- tooth tenaculum was then placed on the anterior lip of the cervix, ultrasound was present.  The ultrasonographer was present.  Previously, this patient was stenotic in the office and we were  unable to get into the endometrial cavity for adequate sampling.  I wanted to make sure that we were in the right location.  The uterus was anteflexed.  Under ultrasound guidance, the cervix was dilated up to an 8 Hegar.  The hysteroscope was then advanced to the uterine fundus and the findings above were noted.  The hysteroscope was then removed and I performed a sharp curettage of all quadrants of the uterus and tissue was collected and sent.  Hysteroscope was then advanced and the ablation procedure. Device was activated.  The ablation continued for 10 minutes without any complications or any falls.  Once the ablation had been completed, the hysteroscope was removed.  Adequate treatment noted.  Single-tooth tenaculum was removed from the cervix and hemostasis was achieved with silver nitrate.  All instrument and sponge counts were correct x3.  The patient was taken to the recovery room in stable condition.  She received Ancef 2 g IV prior to procedure.  She had SCDs on and operating throughout the entire procedure.  The patient tolerated the procedure well.     Jola Schmidt, MD     EBV/MEDQ  D:  09/27/2015  T:  09/27/2015  Job:  [id number]

## 2015-09-27 NOTE — Brief Op Note (Signed)
09/27/2015  4:35 PM  PATIENT:  Oris Drone  49 y.o. female  PRE-OPERATIVE DIAGNOSIS:  N95.0  Postmenopausal Bleeding, ITP  POST-OPERATIVE DIAGNOSIS:  Postmenopausal Bleeding  PROCEDURE:  Procedure(s) with comments: DILATATION & CURETTAGE/HYSTEROSCOPY WITH HYDROTHERMAL ABLATION (N/A) - ultrasound guidance   SURGEON:  Surgeon(s) and Role:    * Thurnell Lose, MD - Primary  PHYSICIAN ASSISTANT: None  ASSISTANTS: none   ANESTHESIA:   paracervical block and MAC  EBL:  Total I/O In: 900 [I.V.:900] Out: 95 [Urine:75; Blood:20]  BLOOD ADMINISTERED:none  DRAINS: none   LOCAL MEDICATIONS USED:  2% LIDOCAINE  and Amount: 12 ml  SPECIMEN:  Source of Specimen:  endometrial currettings  DISPOSITION OF SPECIMEN:  PATHOLOGY  COUNTS:  YES  TOURNIQUET:  * No tourniquets in log *  DICTATION: .Other Dictation: Dictation Number [id number]  PLAN OF CARE: Discharge to home after PACU  PATIENT DISPOSITION:  PACU - hemodynamically stable.   Delay start of Pharmacological VTE agent (>24hrs) due to surgical blood loss or risk of bleeding: yes

## 2015-09-27 NOTE — Discharge Instructions (Signed)
Endometrial Ablation °Endometrial ablation removes the lining of the uterus (endometrium). It is usually a same-day, outpatient treatment. Ablation helps avoid major surgery, such as surgery to remove the cervix and uterus (hysterectomy). After endometrial ablation, you will have little or no menstrual bleeding and may not be able to have children. However, if you are premenopausal, you will need to use a reliable method of birth control following the procedure because of the small chance that pregnancy can occur. °There are different reasons to have this procedure. These reasons include: °· Heavy periods. °· Bleeding that is causing anemia. °· Irregular bleeding. °· Bleeding fibroids on the lining inside the uterus if they are smaller than 3 centimeters. °This procedure may not be possible for you if:  °· You want to have children in the future.   °· You have severe cramps with your menstrual period.   °· You have precancerous or cancerous cells in your uterus.   °· You were recently pregnant.   °· You have gone through menopause.   °· You have had major surgery on your uterus, resulting in thinning of the uterine wall. Surgeries may include: °¨ The removal of one or more uterine fibroids (myomectomy). °¨ A cesarean section with a classic (vertical) incision on your uterus. Ask your health care provider what type of cesarean you had. Sometimes the scar on your skin is different than the scar on your uterus. °Even if you have had surgery on your uterus, certain types of ablation may still be safe for you. Talk with your health care provider. °LET YOUR HEALTH CARE PROVIDER KNOW ABOUT: °· Any allergies you have. °· All medicines you are taking, including vitamins, herbs, eye drops, creams, and over-the-counter medicines. °· Previous problems you or members of your family have had with the use of anesthetics. °· Any blood disorders you have. °· Previous surgeries you have had. °· Medical conditions you have. °RISKS AND  COMPLICATIONS  °Generally, this is a safe procedure. However, as with any procedure, complications can occur. Possible complications include: °· Perforation of the uterus. °· Bleeding. °· Infection of the uterus, bladder, or vagina. °· Injury to surrounding organs. °· An air bubble to the lung (air embolus). °· Pregnancy following the procedure. °· Failure of the procedure to help the problem, requiring hysterectomy. °· Decreased ability to diagnose cancer in the lining of the uterus. °BEFORE THE PROCEDURE °· The lining of the uterus must be tested to make sure there is no pre-cancerous or cancer cells present. °· An ultrasound may be performed to look at the size of the uterus and to check for abnormalities. °· Medicines may be given to thin the lining of the uterus. °PROCEDURE  °During the procedure, your health care provider will use a tool called a resectoscope to help see inside your uterus. There are different ways to remove the lining of your uterus.  °· Radiofrequency - This method uses a radiofrequency-alternating electric current to remove the lining of the uterus. °· Cryotherapy - This method uses extreme cold to freeze the lining of the uterus. °· Heated-Free Liquid - This method uses heated salt (saline) solution to remove the lining of the uterus. °· Microwave - This method uses high-energy microwaves to heat up the lining of the uterus to remove it. °· Thermal balloon - This method involves inserting a catheter with a balloon tip into the uterus. The balloon tip is filled with heated fluid to remove the lining of the uterus. °AFTER THE PROCEDURE  °After your procedure, do   not have sexual intercourse or insert anything into your vagina until permitted by your health care provider. After the procedure, you may experience: °· Cramps. °· Vaginal discharge. °· Frequent urination. °  °This information is not intended to replace advice given to you by your health care provider. Make sure you discuss any  questions you have with your health care provider. °  °Document Released: 10/04/2004 Document Revised: 08/16/2015 Document Reviewed: 04/28/2013 °Elsevier Interactive Patient Education ©2016 [employer] ° °

## 2015-09-27 NOTE — H&P (Signed)
Reason for Appointment  1. 6 month f/u bleeding   History of Present Illness  General:  Pt presents for f/u on postmenopausal bleeding. Currently on Combipatch for mod-severe hot flashes. Since last visit pt has been diagnosed with hyperthyroidism s/p radioactive iodine. Graves disease.  Spotting was heavier than previous. Platelets are stable per last check. Unable to adequately sample endometrium due to stenosis. EMB was negative for malignancy with scant endometrium.   Current Medications  Taking   Multivitamins Tablet 2 tablets Once a day   Vitamin B Complex Vitamin B12 Tablet 1 tablet Once a day, Notes: off and on   Calcium 500 MG Tablet Chewable 2 tablets once a day   Fish Oil 500 MG Capsule 1 capsule Once a day   CombiPatch(Estradiol-Norethindrone Acet) 0.05-0.14 MG/DAY Patch Twice Weekly USE 1 PATCH EVERY 72 HOURS twice weekly   Sertraline HCl 50 MG Tablet 1 tablet nightly   Adderall XR(Amphetamine Salt Combo) 20 MG Capsule Extended Release 24 Hour 1 capsule Once a day in the morning   Medication List reviewed and reconciled with the patient    Past Medical History  ADD/ADHD  history of borderline hyperthyroidism, ?Graves 04/2015  Anxiety/panic  Remote history of migraines - uses acupuncture and dietary changes  History of pneumonia x 3  high acidity- Infertility  ITP   Surgical History  right foot sx 05/2013   Family History  Father: alive 26 yrs, pacemaker, diagnosed with HTN, CVA  Mother: deceased 80 yrs, hypothyroid, ADD, anxiety/panic  Paternal [place] Mother: deceased, HTN, diagnosed with HTN  Brother 1: alive 59 yrs, heart attack, bypass surgery, ADD, Prostate Cancer  Brother2: alive 71 yrs, A + W  Sister 1: alive 20 yrs, hypothyroid, ADD  1 son(s) - healthy.   son with ADHD (Adopted), Nephew Evans Syndrome (includes ITP).   Social History  General:  Tobacco use  cigarettes: Never smoked Tobacco history last updated 09/04/2015 Additional Findings: Tobacco  Non-User Non-smoker for personal reasons no EXPOSURE TO PASSIVE SMOKE.  Alcohol: yes, wine-maybe 1 glass a week,mostly social.  Caffeine: yes, coffee 1 cup a day.  no Recreational drug use.  Exercise: yes, minimal.  Marital Status: Married.  Children: 1 (adopted son).  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    Gyn History  Sexual activity currently sexually active.  Periods : postmenopausal.  LMP 2014 (periodic spotting), 9/15-9/15 normal flow of a period.  Denies H/O Birth control.  Last pap smear date 01/17/14, all negative.  Last mammogram date 08/2013- normal.  Abnormal pap smear thinks maybe in her 20's.    OB History  Never been pregnant per patient, IUI cycle x 7.    Allergies  N.K.D.A.   Hospitalization/Major Diagnostic Procedure  acetabular fracture 08/2011  3 days for low blood platelet count 06/2014   Vital Signs  Wt 137, Wt change -1 lb, Ht 65.5, BMI 22.45, Pulse sitting 74, BP sitting 124/80.   Physical Examination  GENERAL:  Patient appears alert and oriented.  General Appearance: well-appearing, well-developed, no acute distress.  Speech: clear.     Assessments   1. Postmenopausal bleeding - N95.0 (Primary)   2. Immune thrombocytopenic purpura - D69.3, Platelets improved slightly.   Treatment  1. Postmenopausal bleeding  Notes: Due to cervical stenosis, adequate uterine sampling not  performed in the past. Recommend proceeding with D&C. Recommend ablation due to possibility of continued bleeding due to thrombocytopenia.  Referral [place]:[facility] - Gynecology Reason:Precert for HysteroscopyD&C, HTA ablation    2. Immune thrombocytopenic purpura  LAB: CBC with Diff   Procedures  Venipuncture:  Venipuncture: Hartman,Angela 09/04/2015 05:48:16  PM > , performed in right arm.       Labs   Lab: CBC with Diff Plts 117  WBC 4.4  4.0-11.0 - K/ul  RBC 4.43  4.20-5.40 - M/uL  HGB 12.8  12.0-16.0 - g/dL  HCT 39.3  37.0-47.0 - %  MCV 88.9  81.0-99.0 - fL  MCH 28.9  27.0-33.0 - pg  MCHC 32.5  32.0-36.0 - g/dL  RDW 12.7  11.5-15.5 - %  PLT 117 L 150-400 - K/uL  MPV 7.2 L 7.5-10.7 - fL  NRBC# 0.00  -   NEUT % 62.9  43.3-71.9 - %  NRBC% 0.00  - %  LYMPH% 25.3  16.8-43.5 - %  MONO % 10.0  4.6-12.4 - %  EOS % 0.7  0.0-7.8 - %  BASO % 1.1 H 0.0-1.0 - %  NEUT # 2.8  1.9-7.2 - K/uL  LYMPH# 1.10  1.10-2.70 - K/uL  MONO # 0.4  0.3-0.8 - K/uL  EOS # 0.0  0.0-0.6 - K/uL  BASO # 0.0  0.0-0.1 - K/uL   VARNADO,EVELYN B 09/05/2015 11:50:27 AM > Platelets significantly improved. Send to Dr. Buddy Duty after pt notified. Allman,Michelle 09/05/2015 02:02:04 PM > left pt a detailed message. sent to Dr. Buddy Duty for review. KERR,JEFFREY 09/06/2015 09:28:45 AM >

## 2015-09-27 NOTE — Transfer of Care (Signed)
Immediate Anesthesia Transfer of Care Note  Patient: Ashley Key  Procedure(s) Performed: Procedure(s) with comments: DILATATION & CURETTAGE/HYSTEROSCOPY WITH HYDROTHERMAL ABLATION (N/A) - ultrasound guidance   Patient Location: PACU  Anesthesia Type:MAC  Level of Consciousness: awake, alert , oriented and patient cooperative  Airway & Oxygen Therapy: Patient Spontanous Breathing and Patient connected to nasal cannula oxygen  Post-op Assessment: Report given to RN and Post -op Vital signs reviewed and stable  Post vital signs: Reviewed and stable  Last Vitals:  Filed Vitals:   09/27/15 1520  BP:   Pulse:   Temp:   Resp: 18    Complications: No apparent anesthesia complications

## 2015-09-28 ENCOUNTER — Encounter (HOSPITAL_COMMUNITY): Payer: Self-pay | Admitting: Obstetrics and Gynecology

## 2015-10-12 ENCOUNTER — Other Ambulatory Visit: Payer: Self-pay | Admitting: *Deleted

## 2015-10-12 DIAGNOSIS — D693 Immune thrombocytopenic purpura: Secondary | ICD-10-CM

## 2015-10-16 ENCOUNTER — Ambulatory Visit (HOSPITAL_BASED_OUTPATIENT_CLINIC_OR_DEPARTMENT_OTHER): Payer: BLUE CROSS/BLUE SHIELD | Admitting: Oncology

## 2015-10-16 ENCOUNTER — Telehealth: Payer: Self-pay | Admitting: Oncology

## 2015-10-16 ENCOUNTER — Other Ambulatory Visit (HOSPITAL_BASED_OUTPATIENT_CLINIC_OR_DEPARTMENT_OTHER): Payer: BLUE CROSS/BLUE SHIELD

## 2015-10-16 VITALS — BP 143/98 | HR 58 | Temp 97.6°F | Resp 18 | Ht 66.0 in | Wt 142.2 lb

## 2015-10-16 DIAGNOSIS — M069 Rheumatoid arthritis, unspecified: Secondary | ICD-10-CM

## 2015-10-16 DIAGNOSIS — D693 Immune thrombocytopenic purpura: Secondary | ICD-10-CM

## 2015-10-16 DIAGNOSIS — Z23 Encounter for immunization: Secondary | ICD-10-CM

## 2015-10-16 LAB — CBC WITH DIFFERENTIAL/PLATELET
BASO%: 1.5 % (ref 0.0–2.0)
Basophils Absolute: 0.1 10*3/uL (ref 0.0–0.1)
EOS ABS: 0.1 10*3/uL (ref 0.0–0.5)
EOS%: 1.7 % (ref 0.0–7.0)
HEMATOCRIT: 38.1 % (ref 34.8–46.6)
HGB: 12.4 g/dL (ref 11.6–15.9)
LYMPH#: 1.1 10*3/uL (ref 0.9–3.3)
LYMPH%: 29.5 % (ref 14.0–49.7)
MCH: 28.8 pg (ref 25.1–34.0)
MCHC: 32.6 g/dL (ref 31.5–36.0)
MCV: 88.5 fL (ref 79.5–101.0)
MONO#: 0.3 10*3/uL (ref 0.1–0.9)
MONO%: 7.3 % (ref 0.0–14.0)
NEUT%: 60 % (ref 38.4–76.8)
NEUTROS ABS: 2.2 10*3/uL (ref 1.5–6.5)
PLATELETS: 96 10*3/uL — AB (ref 145–400)
RBC: 4.3 10*6/uL (ref 3.70–5.45)
RDW: 13 % (ref 11.2–14.5)
WBC: 3.7 10*3/uL — AB (ref 3.9–10.3)

## 2015-10-16 MED ORDER — INFLUENZA VAC SPLIT QUAD 0.5 ML IM SUSY
0.5000 mL | PREFILLED_SYRINGE | Freq: Once | INTRAMUSCULAR | Status: AC
  Administered 2015-10-16: 0.5 mL via INTRAMUSCULAR
  Filled 2015-10-16: qty 0.5

## 2015-10-16 NOTE — Telephone Encounter (Signed)
gv adn printed appt sched anda vs for pt for May 2017

## 2015-10-16 NOTE — Progress Notes (Signed)
  [place] OFFICE PROGRESS NOTE   Diagnosis: ITP  INTERVAL HISTORY:   Mr. Deblois returns as scheduled. No bleeding. She underwent radioactive iodine therapy for hyperthyroidism in August. She reports feeling "cold and tired. She has not started thyroid hormone replacement. She sees Dr. Buddy Duty this week.  She underwent a uterine ablation and D&C procedure on 09/27/2015.  Objective:  Vital signs in last 24 hours:  Blood pressure 143/98, pulse 58, temperature 97.6 F (36.4 C), temperature source Oral, resp. rate 18, height 5\' 6"  (1.676 m), weight 142 lb 3.2 oz (64.501 kg), SpO2 100 %.    HEENT: Neck without mass Lymphatics: 1/2 cm left posterior scalene node. No other cervical, supraclavicular, or axillary nodes Resp: Lungs clear bilaterally Cardio: Regular rate and rhythm GI: No hepatosplenomegaly Vascular: No leg edema  Skin: Mild erythematous rash at the left upper anterior chest   Lab Results:  Lab Results  Component Value Date   WBC 3.7* 10/16/2015   HGB 12.4 10/16/2015   HCT 38.1 10/16/2015   MCV 88.5 10/16/2015   PLT 96* 10/16/2015   NEUTROABS 2.2 10/16/2015     Medications: I have reviewed the patient's current medications.  Assessment/Plan: 1.Severe thrombocytopenia 06/27/2014 -clinical presentation consistent with a diagnosis of ITP, improved with prednisone   Status post a prednisone taper completed 09/21/2014 2. bruising secondary to #1 -resolved  3. history of vaginal bleeding secondary to #1 , resolved 4. Shotty lymphadenopathy-likely benign  5. history of ADHD  6. mild leukopenia-neutropenia on presentation with ITP 06/27/2014  7. G0 P0  8. low level positive ANA  9. Hyperthyroidism, status post radioactive iodine therapy 07/12/2015  Disposition:  The platelet count is stable. There is no indication for treating the ITP. She will follow-up with Dr. Buddy Duty for management of the thyroid disease. She received an influenza vaccine  today.  Ashley Key will return for a CBC in 3 months and an office visit in 6 months.  Betsy Coder, MD  10/16/2015  3:38 PM

## 2015-12-10 ENCOUNTER — Emergency Department (HOSPITAL_COMMUNITY): Payer: BLUE CROSS/BLUE SHIELD

## 2015-12-10 ENCOUNTER — Emergency Department (HOSPITAL_COMMUNITY)
Admission: EM | Admit: 2015-12-10 | Discharge: 2015-12-10 | Disposition: A | Discharge status: Home / Self Care | Payer: BLUE CROSS/BLUE SHIELD | Attending: Emergency Medicine | Admitting: Emergency Medicine

## 2015-12-10 ENCOUNTER — Encounter (HOSPITAL_COMMUNITY): Payer: Self-pay

## 2015-12-10 DIAGNOSIS — Z862 Personal history of diseases of the blood and blood-forming organs and certain disorders involving the immune mechanism: Secondary | ICD-10-CM | POA: Insufficient documentation

## 2015-12-10 DIAGNOSIS — Z79899 Other long term (current) drug therapy: Secondary | ICD-10-CM | POA: Diagnosis not present

## 2015-12-10 DIAGNOSIS — R0602 Shortness of breath: Secondary | ICD-10-CM | POA: Insufficient documentation

## 2015-12-10 DIAGNOSIS — Z8781 Personal history of (healed) traumatic fracture: Secondary | ICD-10-CM | POA: Insufficient documentation

## 2015-12-10 DIAGNOSIS — R079 Chest pain, unspecified: Secondary | ICD-10-CM | POA: Insufficient documentation

## 2015-12-10 DIAGNOSIS — M1990 Unspecified osteoarthritis, unspecified site: Secondary | ICD-10-CM | POA: Diagnosis not present

## 2015-12-10 DIAGNOSIS — R0789 Other chest pain: Secondary | ICD-10-CM

## 2015-12-10 DIAGNOSIS — Z8659 Personal history of other mental and behavioral disorders: Secondary | ICD-10-CM | POA: Diagnosis not present

## 2015-12-10 DIAGNOSIS — Z8701 Personal history of pneumonia (recurrent): Secondary | ICD-10-CM | POA: Diagnosis not present

## 2015-12-10 LAB — URINALYSIS, ROUTINE W REFLEX MICROSCOPIC
Bilirubin Urine: NEGATIVE
Glucose, UA: NEGATIVE mg/dL
Hgb urine dipstick: NEGATIVE
Ketones, ur: NEGATIVE mg/dL
Leukocytes, UA: NEGATIVE
Nitrite: NEGATIVE
Protein, ur: NEGATIVE mg/dL
Specific Gravity, Urine: 1.019 (ref 1.005–1.030)
pH: 7 (ref 5.0–8.0)

## 2015-12-10 LAB — BASIC METABOLIC PANEL
Anion gap: 7 (ref 5–15)
BUN: 21 mg/dL — ABNORMAL HIGH (ref 6–20)
CO2: 22 mmol/L (ref 22–32)
Calcium: 8.6 mg/dL — ABNORMAL LOW (ref 8.9–10.3)
Chloride: 107 mmol/L (ref 101–111)
Creatinine, Ser: 0.66 mg/dL (ref 0.44–1.00)
GFR calc Af Amer: >60 mL/min (ref >60)
GFR calc non Af Amer: >60 mL/min (ref >60)
Glucose, Bld: 94 mg/dL (ref 65–99)
Potassium: 4.4 mmol/L (ref 3.5–5.1)
Sodium: 136 mmol/L (ref 135–145)

## 2015-12-10 LAB — CBC
HCT: 37.4 % (ref 36.0–46.0)
Hemoglobin: 12.7 g/dL (ref 12.0–15.0)
MCH: 29.8 pg (ref 26.0–34.0)
MCHC: 34 g/dL (ref 30.0–36.0)
MCV: 87.8 fL (ref 78.0–100.0)
Platelets: 61 10*3/uL — ABNORMAL LOW (ref 150–400)
RBC: 4.26 MIL/uL (ref 3.87–5.11)
RDW: 13.3 % (ref 11.5–15.5)
WBC: 2.8 10*3/uL — ABNORMAL LOW (ref 4.0–10.5)

## 2015-12-10 LAB — I-STAT TROPONIN, ED
Troponin i, poc: 0 ng/mL (ref 0.00–0.08)
Troponin i, poc: 0 ng/mL (ref 0.00–0.08)

## 2015-12-10 LAB — D-DIMER, QUANTITATIVE (NOT AT ARMC): D-Dimer, Quant: <0.27 ug/mL-FEU (ref 0.00–0.50)

## 2015-12-10 MED ORDER — SODIUM CHLORIDE 0.9 % IV BOLUS (SEPSIS)
1000.0000 mL | Freq: Once | INTRAVENOUS | Status: AC
  Administered 2015-12-10: 1000 mL via INTRAVENOUS

## 2015-12-10 NOTE — Discharge Instructions (Signed)
Your testing was normal here today. Return here as needed. Follow up with your primary doctor.
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
## 2015-12-21 ENCOUNTER — Telehealth: Payer: Self-pay | Admitting: Internal Medicine

## 2015-12-21 NOTE — Telephone Encounter (Signed)
Received records from [place] for appointment on 01/18/16 with Dr Debara Pickett.  Records given to [facility] (medical records) for Dr Lysbeth Penner schedule on 01/18/16. lp

## 2016-01-18 ENCOUNTER — Ambulatory Visit (INDEPENDENT_AMBULATORY_CARE_PROVIDER_SITE_OTHER): Payer: BLUE CROSS/BLUE SHIELD | Admitting: Internal Medicine

## 2016-01-18 ENCOUNTER — Encounter: Payer: Self-pay | Admitting: Internal Medicine

## 2016-01-18 VITALS — BP 126/62 | HR 66 | Ht 66.0 in | Wt 142.9 lb

## 2016-01-18 DIAGNOSIS — E039 Hypothyroidism, unspecified: Secondary | ICD-10-CM | POA: Insufficient documentation

## 2016-01-18 DIAGNOSIS — I10 Essential (primary) hypertension: Secondary | ICD-10-CM

## 2016-01-18 DIAGNOSIS — E890 Postprocedural hypothyroidism: Secondary | ICD-10-CM

## 2016-01-18 DIAGNOSIS — R079 Chest pain, unspecified: Secondary | ICD-10-CM | POA: Insufficient documentation

## 2016-01-18 DIAGNOSIS — Z13220 Encounter for screening for lipoid disorders: Secondary | ICD-10-CM

## 2016-01-18 HISTORY — DX: Hypothyroidism, unspecified: E03.9

## 2016-01-18 HISTORY — DX: Essential (primary) hypertension: I10

## 2016-01-18 HISTORY — DX: Chest pain, unspecified: R07.9

## 2016-01-18 HISTORY — DX: Encounter for screening for lipoid disorders: Z13.220

## 2016-01-18 NOTE — Patient Instructions (Signed)
Medication Instructions: Dr Debara Pickett has made no medication changes today.  Labwork: Your physician recommends that you return for lab work at your earliest [place].  Testing/Procedures: 1. Your physician has requested that you have an exercise tolerance test. For further information please visit [email or web address]. Please also follow instruction sheet, as given.  2. Your physician has requested that you have a coronary calcium score. This will be done at our [facility] location, [address], [address].  Follow-up: Dr Debara Pickett recommends that you schedule a follow-up appointment after your tests.  If you need a refill on your cardiac medications before your next appointment, please call your pharmacy.

## 2016-01-18 NOTE — Progress Notes (Signed)
OFFICE NOTE  Chief Complaint:  Chest pain  Primary Care Physician: London Pepper, MD  HPI:  Ashley Key is a pleasant 50 yo female who is referred to me after recent ER evaluation for chest pain. She reported substernal chest burning, which radiated to her back. Subsequently, she felt it may be reflux. She ruled out for MI and outpatient follow=up was recommended. She has been using baking soda with positive results. She has hypertension, but it is controlled with medication. There is a strong family history of CAD including her brother who had 3 heart attacks and stents at 13 and both parents with CAD / heart attacks. She also is hypothyroid after being treated for Graves' disease and having thyroid ablation. She also has ITP but is just monitored for it at this time.  Since discharge she has had no further chest pain episodes. EKG reviewed today shows no ischemia, low voltage, unchanged from prior studies.  PMHx:  Past Medical History  Diagnosis Date  . Bunion   . Arch pain   . ADHD (attention deficit hyperactivity disorder)   . Pelvic fracture ([place])   . Pneumonia   . Headache     HISTORY MIGRAINE  . Arthritis     FEET  . ITP (idiopathic thrombocytopenic purpura)   . Blood dyscrasia     NORMAL PLATLETS 50-70'S    Past Surgical History  Procedure Laterality Date  . Foot surgery    . Thyroid radation 2016    . Wisdom tooth extraction    . Dilitation & currettage/hystroscopy with hydrothermal ablation N/A 09/27/2015    Procedure: DILATATION & CURETTAGE/HYSTEROSCOPY WITH HYDROTHERMAL ABLATION;  Surgeon: Thurnell Lose, MD;  [facility];  Service: Gynecology;  Laterality: N/A;  ultrasound guidance     FAMHx:  Family History  Problem Relation Age of Onset  . CAD Mother   . Stroke Father   . CAD Brother     SOCHx:   reports that she has never smoked. She has never used smokeless tobacco. She reports that she drinks alcohol. She reports that she does not use  illicit drugs.  ALLERGIES:  No Known Allergies  ROS: Pertinent items noted in HPI and remainder of comprehensive ROS otherwise negative.  HOME MEDS: Current Outpatient Prescriptions  Medication Sig Dispense Refill  . amLODipine (NORVASC) 5 MG tablet Take by mouth daily.  2  . CALCIUM-VITAMIN D PO Take 1 tablet by mouth daily.    . COMBIPATCH 0.05-0.25 MG/DAY Place 1 patch onto the skin 2 (two) times a week. Apply a new patch wednesdays and sundays.  1  . ibuprofen (ADVIL,MOTRIN) 600 MG tablet Take 1 tablet (600 mg total) by mouth every 6 (six) hours as needed. 30 tablet 0  . Multiple Vitamins-Minerals (MULTIVITAMIN PO) Take 1 tablet by mouth daily.    . Omega-3 Fatty Acids (FISH OIL) 1200 MG CAPS Take 1,200 mg by mouth daily.    . sertraline (ZOLOFT) 50 MG tablet Take 50 mg by mouth at bedtime.    Marland Kitchen SYNTHROID 100 MCG tablet Take 100 mcg by mouth daily.  5   No current facility-administered medications for this visit.    LABS/IMAGING: No results found for this or any previous visit (from the past 48 hour(s)). No results found.  WEIGHTS: Wt Readings from Last 3 Encounters:  01/18/16 142 lb 14.4 oz (64.819 kg)  12/10/15 140 lb (63.504 kg)  10/16/15 142 lb 3.2 oz (64.501 kg)    VITALS: BP 126/62  mmHg  Pulse 66  Ht 5\' 6"  (1.676 m)  Wt 142 lb 14.4 oz (64.819 kg)  BMI 23.08 kg/m2  EXAM: General appearance: alert and no distress Neck: no JVD and supple, symmetrical, trachea midline Lungs: clear to auscultation bilaterally Heart: regular rate and rhythm, S1, S2 normal, no murmur, click, rub or gallop Abdomen: soft, non-tender; bowel sounds normal; no masses,  no organomegaly Extremities: extremities normal, atraumatic, no cyanosis or edema Pulses: 2+ and symmetric Skin: Skin color, texture, turgor normal. No rashes or lesions Neurologic: Grossly normal Psych: Pleasant  EKG: NSR at 66, no ischemic changes, low voltage  ASSESSMENT: 1. Chest pain 2. Strong family history  of CAD 3. HTN  PLAN: 1.   Ashley Key presented with chest pain which sounded more like reflux. She has significant family risk factors for CAD and known HTN. I would recommend a bruce treadmill stress test to assess exercise performance and evaluate for obstructive disease. If negative, it would be helpful to have a coronary calcium score to see if she has premature CAD, which could alter how aggressive we are with lipid management. In addition, a lipid NMR will be obtained to further assess her cardiovascular risk.  Follow-up in 1 month to review studies and bloodwork.  Pixie Casino, MD, [facility] 352 Attending Cardiologist [place] 01/18/2016, 10:29 AM

## 2016-01-22 ENCOUNTER — Encounter: Payer: Self-pay | Admitting: Internal Medicine

## 2016-01-26 ENCOUNTER — Telehealth (HOSPITAL_COMMUNITY): Payer: Self-pay

## 2016-01-26 ENCOUNTER — Telehealth: Payer: Self-pay | Admitting: *Deleted

## 2016-01-26 LAB — CARDIO IQ(R) ADVANCED LIPID PANEL
Apolipoprotein B: 93 mg/dL (ref 49–103)
Cholesterol, Total: 224 mg/dL — ABNORMAL HIGH (ref 125–200)
Cholesterol/HDL Ratio: 3.1 calc (ref <=5.0)
HDL CHOLESTEROL (CARDIO IQ ADV LIPID PANEL): 72 mg/dL (ref >=46)
LDL CHOLESTEROL CALCULATED (CARDIO IQ ADV LIPID PANEL): 139 mg/dL — AB
LDL LARGE: 5478 nmol/L (ref 5038–17886)
LDL MEDIUM: 245 nmol/L (ref 121–397)
LDL PARTICLE NUMBER: 1344 nmol/L (ref 1016–2185)
LDL Peak Size: 225.6 Angstrom (ref >=218.2)
LDL Small: 161 nmol/L (ref 115–386)
LIPOPROTEIN (A) (CARDIO IQ ADV LIPID PANEL): 75 nmol/L — AB (ref <75)
Non-HDL Cholesterol: 152 mg/dL
Triglycerides: 64 mg/dL

## 2016-01-26 NOTE — Telephone Encounter (Signed)
Encounter complete. 

## 2016-01-26 NOTE — Telephone Encounter (Signed)
Left message for patient to call and schedule calcium scoring that was ordered by Dr. Debara Pickett

## 2016-01-31 ENCOUNTER — Ambulatory Visit (HOSPITAL_COMMUNITY)
Admission: RE | Admit: 2016-01-31 | Discharge: 2016-01-31 | Disposition: A | Discharge status: Home / Self Care | Payer: BLUE CROSS/BLUE SHIELD | Source: Ambulatory Visit | Attending: Cardiovascular Disease | Admitting: Cardiovascular Disease

## 2016-01-31 DIAGNOSIS — R079 Chest pain, unspecified: Secondary | ICD-10-CM | POA: Insufficient documentation

## 2016-01-31 LAB — EXERCISE TOLERANCE TEST
CHL CUP MPHR: 144 {beats}/min
CHL CUP STRESS STAGE 1 SPEED: 0 mph
CHL CUP STRESS STAGE 2 GRADE: 0 %
CHL CUP STRESS STAGE 2 SPEED: 1 mph
CHL CUP STRESS STAGE 3 GRADE: 0 %
CHL CUP STRESS STAGE 3 HR: 92 {beats}/min
CHL CUP STRESS STAGE 4 HR: 107 {beats}/min
CHL CUP STRESS STAGE 5 DBP: 86 mmHg
CHL CUP STRESS STAGE 5 SPEED: 2.5 mph
CHL CUP STRESS STAGE 6 HR: 134 {beats}/min
CHL CUP STRESS STAGE 7 SPEED: 4.2 mph
CHL CUP STRESS STAGE 8 DBP: 77 mmHg
CHL CUP STRESS STAGE 8 GRADE: 0 %
CHL CUP STRESS STAGE 8 HR: 130 {beats}/min
CHL CUP STRESS STAGE 9 DBP: 91 mmHg
CHL CUP STRESS STAGE 9 SBP: 127 mmHg
CHL RATE OF PERCEIVED EXERTION: 17
CSEPED: 9 min
CSEPEDS: 54 s
CSEPEW: 11.5 METS
CSEPPHR: 151 {beats}/min
CSEPPMHR: 88 %
Percent HR: 88 %
Rest HR: 81 {beats}/min
Stage 1 DBP: 85 mmHg
Stage 1 Grade: 0 %
Stage 1 HR: 101 {beats}/min
Stage 1 SBP: 124 mmHg
Stage 2 HR: 91 {beats}/min
Stage 3 Speed: 1 mph
Stage 4 DBP: 90 mmHg
Stage 4 Grade: 10 %
Stage 4 SBP: 120 mmHg
Stage 4 Speed: 1.7 mph
Stage 5 Grade: 12 %
Stage 5 HR: 121 {beats}/min
Stage 5 SBP: 124 mmHg
Stage 6 DBP: 83 mmHg
Stage 6 Grade: 14 %
Stage 6 SBP: 117 mmHg
Stage 6 Speed: 3.4 mph
Stage 7 Grade: 16 %
Stage 7 HR: 151 {beats}/min
Stage 8 SBP: 122 mmHg
Stage 8 Speed: 0 mph
Stage 9 Grade: 0 %
Stage 9 HR: 92 {beats}/min
Stage 9 Speed: 0 mph

## 2016-02-07 ENCOUNTER — Ambulatory Visit: Payer: BLUE CROSS/BLUE SHIELD | Admitting: Internal Medicine

## 2016-02-08 ENCOUNTER — Encounter: Payer: Self-pay | Admitting: *Deleted

## 2016-02-08 ENCOUNTER — Telehealth: Payer: Self-pay | Admitting: Internal Medicine

## 2016-02-08 DIAGNOSIS — Z8249 Family history of ischemic heart disease and other diseases of the circulatory system: Secondary | ICD-10-CM

## 2016-02-08 DIAGNOSIS — R079 Chest pain, unspecified: Secondary | ICD-10-CM

## 2016-02-08 NOTE — Telephone Encounter (Signed)
Hi Levada Dy,  It seems that Dr. Debara Pickett had mentioned ordering a calcium score on this patient if her treadmill was normal.  Her treadmill was unremarkable, however the order is for a cardiac CTA. Can you verify this for me please and change the order so the girls can get her scheduled please?  Thank you so much!  Caryl Pina

## 2016-02-08 NOTE — Telephone Encounter (Signed)
I sent a staff message to Dr. Lysbeth Penner nurse asking if the patient still needed this test.  She is out today so I will let you know if it needs to be scheduled.

## 2016-02-09 NOTE — Telephone Encounter (Signed)
Per MD note, patient needed ONLY coronary calcium score, if GXT was unremarkable.  Coronary calcium score has been ordered to be completed at [facility]

## 2016-02-19 ENCOUNTER — Ambulatory Visit (INDEPENDENT_AMBULATORY_CARE_PROVIDER_SITE_OTHER)
Admission: RE | Admit: 2016-02-19 | Discharge: 2016-02-19 | Disposition: A | Discharge status: Home / Self Care | Payer: Self-pay | Source: Ambulatory Visit | Attending: Internal Medicine | Admitting: Internal Medicine

## 2016-02-19 DIAGNOSIS — Z8249 Family history of ischemic heart disease and other diseases of the circulatory system: Secondary | ICD-10-CM

## 2016-02-19 DIAGNOSIS — R079 Chest pain, unspecified: Secondary | ICD-10-CM

## 2016-02-27 ENCOUNTER — Telehealth: Payer: Self-pay | Admitting: Internal Medicine

## 2016-02-27 DIAGNOSIS — E785 Hyperlipidemia, unspecified: Secondary | ICD-10-CM

## 2016-02-27 MED ORDER — ROSUVASTATIN CALCIUM 5 MG PO TABS: 5.0000 mg | ORAL_TABLET | Freq: Every day | ORAL | Status: DC

## 2016-02-27 NOTE — Telephone Encounter (Signed)
Patient notified that she will need to take a low dose statin for primary prevention by MD. She had no coronary calcium but LDL was > 130. Dr. Debara Pickett had advised crestor 5mg  once daily, a repeat lipid in 6 months and follow up with MD after. Med and labs ordered.

## 2016-03-18 ENCOUNTER — Ambulatory Visit: Payer: BLUE CROSS/BLUE SHIELD | Admitting: Oncology

## 2016-03-18 ENCOUNTER — Other Ambulatory Visit: Payer: BLUE CROSS/BLUE SHIELD

## 2016-04-12 ENCOUNTER — Other Ambulatory Visit: Payer: Self-pay | Admitting: *Deleted

## 2016-04-12 DIAGNOSIS — D693 Immune thrombocytopenic purpura: Secondary | ICD-10-CM

## 2016-04-15 ENCOUNTER — Ambulatory Visit: Payer: BLUE CROSS/BLUE SHIELD | Admitting: Oncology

## 2016-04-15 ENCOUNTER — Other Ambulatory Visit: Payer: BLUE CROSS/BLUE SHIELD

## 2016-04-15 ENCOUNTER — Encounter: Payer: Self-pay | Admitting: *Deleted

## 2016-04-15 ENCOUNTER — Other Ambulatory Visit: Payer: Self-pay | Admitting: *Deleted

## 2016-04-15 NOTE — Progress Notes (Signed)
Missed lab and appt with MD today.  Called pt to check on her status and received her voicemail.  Asked pt to please call back with an update.

## 2016-04-16 ENCOUNTER — Telehealth: Payer: Self-pay | Admitting: Oncology

## 2016-04-16 NOTE — Telephone Encounter (Signed)
lmom to inform patient of r/s appt date/time per 5/8 pof for 5/25 at 11 am

## 2016-04-17 ENCOUNTER — Telehealth: Payer: Self-pay | Admitting: Oncology

## 2016-04-17 NOTE — Telephone Encounter (Signed)
pt needed to resched apt, pt can only make it after 3:15 or after, requested to have lab day before apt

## 2016-04-22 ENCOUNTER — Other Ambulatory Visit: Payer: BLUE CROSS/BLUE SHIELD

## 2016-04-23 ENCOUNTER — Telehealth: Payer: Self-pay | Admitting: Nurse Practitioner

## 2016-04-23 ENCOUNTER — Ambulatory Visit (HOSPITAL_BASED_OUTPATIENT_CLINIC_OR_DEPARTMENT_OTHER): Payer: BLUE CROSS/BLUE SHIELD | Admitting: Nurse Practitioner

## 2016-04-23 ENCOUNTER — Ambulatory Visit (HOSPITAL_BASED_OUTPATIENT_CLINIC_OR_DEPARTMENT_OTHER): Payer: BLUE CROSS/BLUE SHIELD

## 2016-04-23 VITALS — BP 109/70 | HR 94 | Temp 98.2°F | Resp 17 | Ht 66.0 in | Wt 138.6 lb

## 2016-04-23 DIAGNOSIS — D693 Immune thrombocytopenic purpura: Secondary | ICD-10-CM

## 2016-04-23 LAB — CBC WITH DIFFERENTIAL/PLATELET
BASO%: 1.2 % (ref 0.0–2.0)
BASOS ABS: 0 10*3/uL (ref 0.0–0.1)
EOS ABS: 0 10*3/uL (ref 0.0–0.5)
EOS%: 1 % (ref 0.0–7.0)
HEMATOCRIT: 39.6 % (ref 34.8–46.6)
HEMOGLOBIN: 12.9 g/dL (ref 11.6–15.9)
LYMPH#: 1.1 10*3/uL (ref 0.9–3.3)
LYMPH%: 29.6 % (ref 14.0–49.7)
MCH: 28.7 pg (ref 25.1–34.0)
MCHC: 32.5 g/dL (ref 31.5–36.0)
MCV: 88.2 fL (ref 79.5–101.0)
MONO#: 0.5 10*3/uL (ref 0.1–0.9)
MONO%: 12 % (ref 0.0–14.0)
NEUT%: 56.2 % (ref 38.4–76.8)
NEUTROS ABS: 2.1 10*3/uL (ref 1.5–6.5)
PLATELETS: 53 10*3/uL — AB (ref 145–400)
RBC: 4.49 10*6/uL (ref 3.70–5.45)
RDW: 13 % (ref 11.2–14.5)
WBC: 3.8 10*3/uL — AB (ref 3.9–10.3)

## 2016-04-23 NOTE — Telephone Encounter (Signed)
Gave pt calender and avs

## 2016-04-23 NOTE — Progress Notes (Signed)
  [place] OFFICE PROGRESS NOTE   Diagnosis:  ITP  INTERVAL HISTORY:   Ashley Key returns for follow-up. She denies any bleeding. She continues to note "easy bruising" with minor trauma. Her main complaint today is stress related to her job.  Objective:  Vital signs in last 24 hours:  Blood pressure 109/70, pulse 94, temperature 98.2 F (36.8 C), temperature source Oral, resp. rate 17, height 5\' 6"  (1.676 m), weight 138 lb 9.6 oz (62.869 kg), SpO2 99 %.    HEENT: No thrush or ulcers. Lymphatics: Question tiny, 2 mm, left posterior scalene node. No other cervical, supraclavicular or axillary nodes. Resp: Lungs clear bilaterally. Cardio: Regular rate and rhythm. GI: Abdomen soft and nontender. No organomegaly. Vascular: No leg edema. Skin: Resolving ecchymosis left forearm.    Lab Results:  Lab Results  Component Value Date   WBC 2.8* 12/10/2015   HGB 12.7 12/10/2015   HCT 37.4 12/10/2015   MCV 87.8 12/10/2015   PLT 61* 12/10/2015   NEUTROABS 2.2 10/16/2015    Imaging:  No results found.  Medications: I have reviewed the patient's current medications.  Assessment/Plan: 1.Severe thrombocytopenia 06/27/2014 -clinical presentation consistent with a diagnosis of ITP, improved with prednisone   Status post a prednisone taper completed 09/21/2014 2. bruising secondary to #1 -resolved  3. history of vaginal bleeding secondary to #1 , resolved 4. Shotty lymphadenopathy-likely benign  5. history of ADHD  6. mild leukopenia-neutropenia on presentation with ITP 06/27/2014  7. G0 P0  8. low level positive ANA  9. Hyperthyroidism, status post radioactive iodine therapy 07/12/2015   Disposition:The platelet count is decreased as compared to 6 months ago, stable compared to approximately 4 months ago. There is no indication for treating the ITP at present. She will return for a follow-up CBC in 3 months and an office visit in 6 months. She understands  to contact the office with spontaneous bruising/bleeding.  Plan reviewed with Dr. Benay Spice.    [name], [name] ANP/GNP-BC   04/23/2016  3:41 PM

## 2016-04-25 DIAGNOSIS — F4311 Post-traumatic stress disorder, acute: Secondary | ICD-10-CM | POA: Diagnosis not present

## 2016-04-30 DIAGNOSIS — F4311 Post-traumatic stress disorder, acute: Secondary | ICD-10-CM | POA: Diagnosis not present

## 2016-05-02 ENCOUNTER — Ambulatory Visit: Payer: BLUE CROSS/BLUE SHIELD | Admitting: Oncology

## 2016-05-02 ENCOUNTER — Other Ambulatory Visit: Payer: BLUE CROSS/BLUE SHIELD

## 2016-06-21 DIAGNOSIS — E0590 Thyrotoxicosis, unspecified without thyrotoxic crisis or storm: Secondary | ICD-10-CM | POA: Diagnosis not present

## 2016-07-24 ENCOUNTER — Other Ambulatory Visit (HOSPITAL_BASED_OUTPATIENT_CLINIC_OR_DEPARTMENT_OTHER): Payer: BLUE CROSS/BLUE SHIELD

## 2016-07-24 ENCOUNTER — Telehealth: Payer: Self-pay

## 2016-07-24 DIAGNOSIS — D693 Immune thrombocytopenic purpura: Secondary | ICD-10-CM

## 2016-07-24 LAB — CBC WITH DIFFERENTIAL/PLATELET
BASO%: 1 % (ref 0.0–2.0)
BASOS ABS: 0 10*3/uL (ref 0.0–0.1)
EOS ABS: 0.1 10*3/uL (ref 0.0–0.5)
EOS%: 2.8 % (ref 0.0–7.0)
HEMATOCRIT: 39.9 % (ref 34.8–46.6)
HEMOGLOBIN: 12.9 g/dL (ref 11.6–15.9)
LYMPH#: 1 10*3/uL (ref 0.9–3.3)
LYMPH%: 28.5 % (ref 14.0–49.7)
MCH: 28.9 pg (ref 25.1–34.0)
MCHC: 32.3 g/dL (ref 31.5–36.0)
MCV: 89.4 fL (ref 79.5–101.0)
MONO#: 0.4 10*3/uL (ref 0.1–0.9)
MONO%: 11.7 % (ref 0.0–14.0)
NEUT#: 2.1 10*3/uL (ref 1.5–6.5)
NEUT%: 56 % (ref 38.4–76.8)
PLATELETS: 74 10*3/uL — AB (ref 145–400)
RBC: 4.47 10*6/uL (ref 3.70–5.45)
RDW: 13.5 % (ref 11.2–14.5)
WBC: 3.7 10*3/uL — ABNORMAL LOW (ref 3.9–10.3)

## 2016-07-24 NOTE — Telephone Encounter (Signed)
-----   Message from Owens Shark, NP sent at 07/24/2016  3:13 PM EDT ----- Please let her know the platelet count is better. Follow-up as scheduled.

## 2016-07-24 NOTE — Telephone Encounter (Signed)
Pt came to cancer center, thought she was seeing Dr. Benay Spice today. Confirmed last note, pt to have labs today and see Dr. Benay Spice in November. Schedule printed for patient and went over plt count. Pt denies any further questions or concerns at this time.

## 2016-07-24 NOTE — Telephone Encounter (Signed)
Called and left message for pt to call back regarding lab results. 

## 2016-10-03 DIAGNOSIS — M25561 Pain in right knee: Secondary | ICD-10-CM | POA: Diagnosis not present

## 2016-10-24 ENCOUNTER — Ambulatory Visit: Payer: BLUE CROSS/BLUE SHIELD | Admitting: Oncology

## 2016-10-24 ENCOUNTER — Other Ambulatory Visit: Payer: BLUE CROSS/BLUE SHIELD

## 2016-10-30 ENCOUNTER — Telehealth: Payer: Self-pay | Admitting: Oncology

## 2016-10-30 ENCOUNTER — Other Ambulatory Visit (HOSPITAL_BASED_OUTPATIENT_CLINIC_OR_DEPARTMENT_OTHER): Payer: BLUE CROSS/BLUE SHIELD

## 2016-10-30 ENCOUNTER — Ambulatory Visit (HOSPITAL_BASED_OUTPATIENT_CLINIC_OR_DEPARTMENT_OTHER): Payer: BLUE CROSS/BLUE SHIELD | Admitting: Oncology

## 2016-10-30 VITALS — BP 120/79 | HR 64 | Temp 97.6°F | Resp 18 | Wt 158.4 lb

## 2016-10-30 DIAGNOSIS — D693 Immune thrombocytopenic purpura: Secondary | ICD-10-CM | POA: Diagnosis not present

## 2016-10-30 DIAGNOSIS — E039 Hypothyroidism, unspecified: Secondary | ICD-10-CM

## 2016-10-30 LAB — CBC WITH DIFFERENTIAL/PLATELET
BASO%: 0.6 % (ref 0.0–2.0)
Basophils Absolute: 0 10*3/uL (ref 0.0–0.1)
EOS ABS: 0.1 10*3/uL (ref 0.0–0.5)
EOS%: 3.5 % (ref 0.0–7.0)
HCT: 40 % (ref 34.8–46.6)
HEMOGLOBIN: 13.4 g/dL (ref 11.6–15.9)
LYMPH#: 1.1 10*3/uL (ref 0.9–3.3)
LYMPH%: 35.4 % (ref 14.0–49.7)
MCH: 29.2 pg (ref 25.1–34.0)
MCHC: 33.5 g/dL (ref 31.5–36.0)
MCV: 87.1 fL (ref 79.5–101.0)
MONO#: 0.3 10*3/uL (ref 0.1–0.9)
MONO%: 10.8 % (ref 0.0–14.0)
NEUT%: 49.7 % (ref 38.4–76.8)
NEUTROS ABS: 1.6 10*3/uL (ref 1.5–6.5)
Platelets: 73 10*3/uL — ABNORMAL LOW (ref 145–400)
RBC: 4.59 10*6/uL (ref 3.70–5.45)
RDW: 12.7 % (ref 11.2–14.5)
WBC: 3.2 10*3/uL — AB (ref 3.9–10.3)

## 2016-10-30 NOTE — Progress Notes (Signed)
  [place] [place] OFFICE PROGRESS NOTE   Diagnosis: ITP  INTERVAL HISTORY:   Ms. Lafountain returns as scheduled. No spontaneous bleeding. 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 No fever. Hot flashes have improved with estrogen replacement. She is gained weight since undergoing treatment for hyperthyroidism.  Objective:  Vital signs in last 24 hours:  Blood pressure 120/79, pulse 64, temperature 97.6 F (36.4 C), temperature source Oral, resp. rate 18, weight 158 lb 6.4 oz (71.8 kg), SpO2 99 %.    HEENT: Oropharynx without visible mass, neck without mass Lymphatics: Pea-sized left posterior scalene node. No other cervical, supraclavicular, or axillary nodes Resp: Lungs clear bilaterally Cardio: Regular rate and rhythm GI: No hepatosplenomegaly, no mass, nontender Vascular: No leg edema  Skin: Resolving ecchymosis at the right forearm    Lab Results:  Lab Results  Component Value Date   WBC 3.2 (L) 10/30/2016   HGB 13.4 10/30/2016   HCT 40.0 10/30/2016   MCV 87.1 10/30/2016   PLT 73 (L) 10/30/2016   NEUTROABS 1.6 10/30/2016      Medications: I have reviewed the patient's current medications.  Assessment/Plan: 1.Severe thrombocytopenia 06/27/2014 -clinical presentation consistent with a diagnosis of ITP, improved with prednisone   Status post a prednisone taper completed 09/21/2014 2. bruising secondary to #1 -resolved  3. history of vaginal bleeding secondary to #1 , resolved 4. Shotty lymphadenopathy-likely benign  5. history of ADHD  6. mild leukopenia-neutropenia on presentation with ITP 06/27/2014  7. G0 P0  8. low level positive ANA  9. Hyperthyroidism, status post radioactive iodine therapy 07/12/2015   Disposition:  Ms. Struve is stable from a hematologic standpoint. She appears to have chronic ITP. No indication for treatment at present. She will contact us for spontaneous bleeding or  bruising. She will return for an office visit and CBC in 8 months.  Betsy Coder, MD  10/30/2016  10:44 AM

## 2016-10-30 NOTE — Telephone Encounter (Signed)
Appointments scheduled per 11/22 LOS. Patient given AVS report and calendars with future scheduled appointments. °

## 2016-12-07 ENCOUNTER — Other Ambulatory Visit: Payer: Self-pay | Admitting: Internal Medicine

## 2016-12-07 DIAGNOSIS — E785 Hyperlipidemia, unspecified: Secondary | ICD-10-CM

## 2016-12-20 DIAGNOSIS — F411 Generalized anxiety disorder: Secondary | ICD-10-CM | POA: Diagnosis not present

## 2016-12-20 DIAGNOSIS — D693 Immune thrombocytopenic purpura: Secondary | ICD-10-CM | POA: Diagnosis not present

## 2016-12-20 DIAGNOSIS — E890 Postprocedural hypothyroidism: Secondary | ICD-10-CM | POA: Diagnosis not present

## 2016-12-20 DIAGNOSIS — R635 Abnormal weight gain: Secondary | ICD-10-CM | POA: Diagnosis not present

## 2016-12-24 DIAGNOSIS — J069 Acute upper respiratory infection, unspecified: Secondary | ICD-10-CM | POA: Diagnosis not present

## 2016-12-24 DIAGNOSIS — J029 Acute pharyngitis, unspecified: Secondary | ICD-10-CM | POA: Diagnosis not present

## 2016-12-24 DIAGNOSIS — D693 Immune thrombocytopenic purpura: Secondary | ICD-10-CM | POA: Diagnosis not present

## 2016-12-29 DIAGNOSIS — H9202 Otalgia, left ear: Secondary | ICD-10-CM | POA: Diagnosis not present

## 2017-01-03 DIAGNOSIS — B029 Zoster without complications: Secondary | ICD-10-CM | POA: Insufficient documentation

## 2017-01-03 IMAGING — DX DG CHEST 2V
2 series · 2 of 2 positions shown · non-contrast
Comparison: None.

CLINICAL DATA: Chest pain

EXAM:
CHEST  2 VIEW

[chest pa]
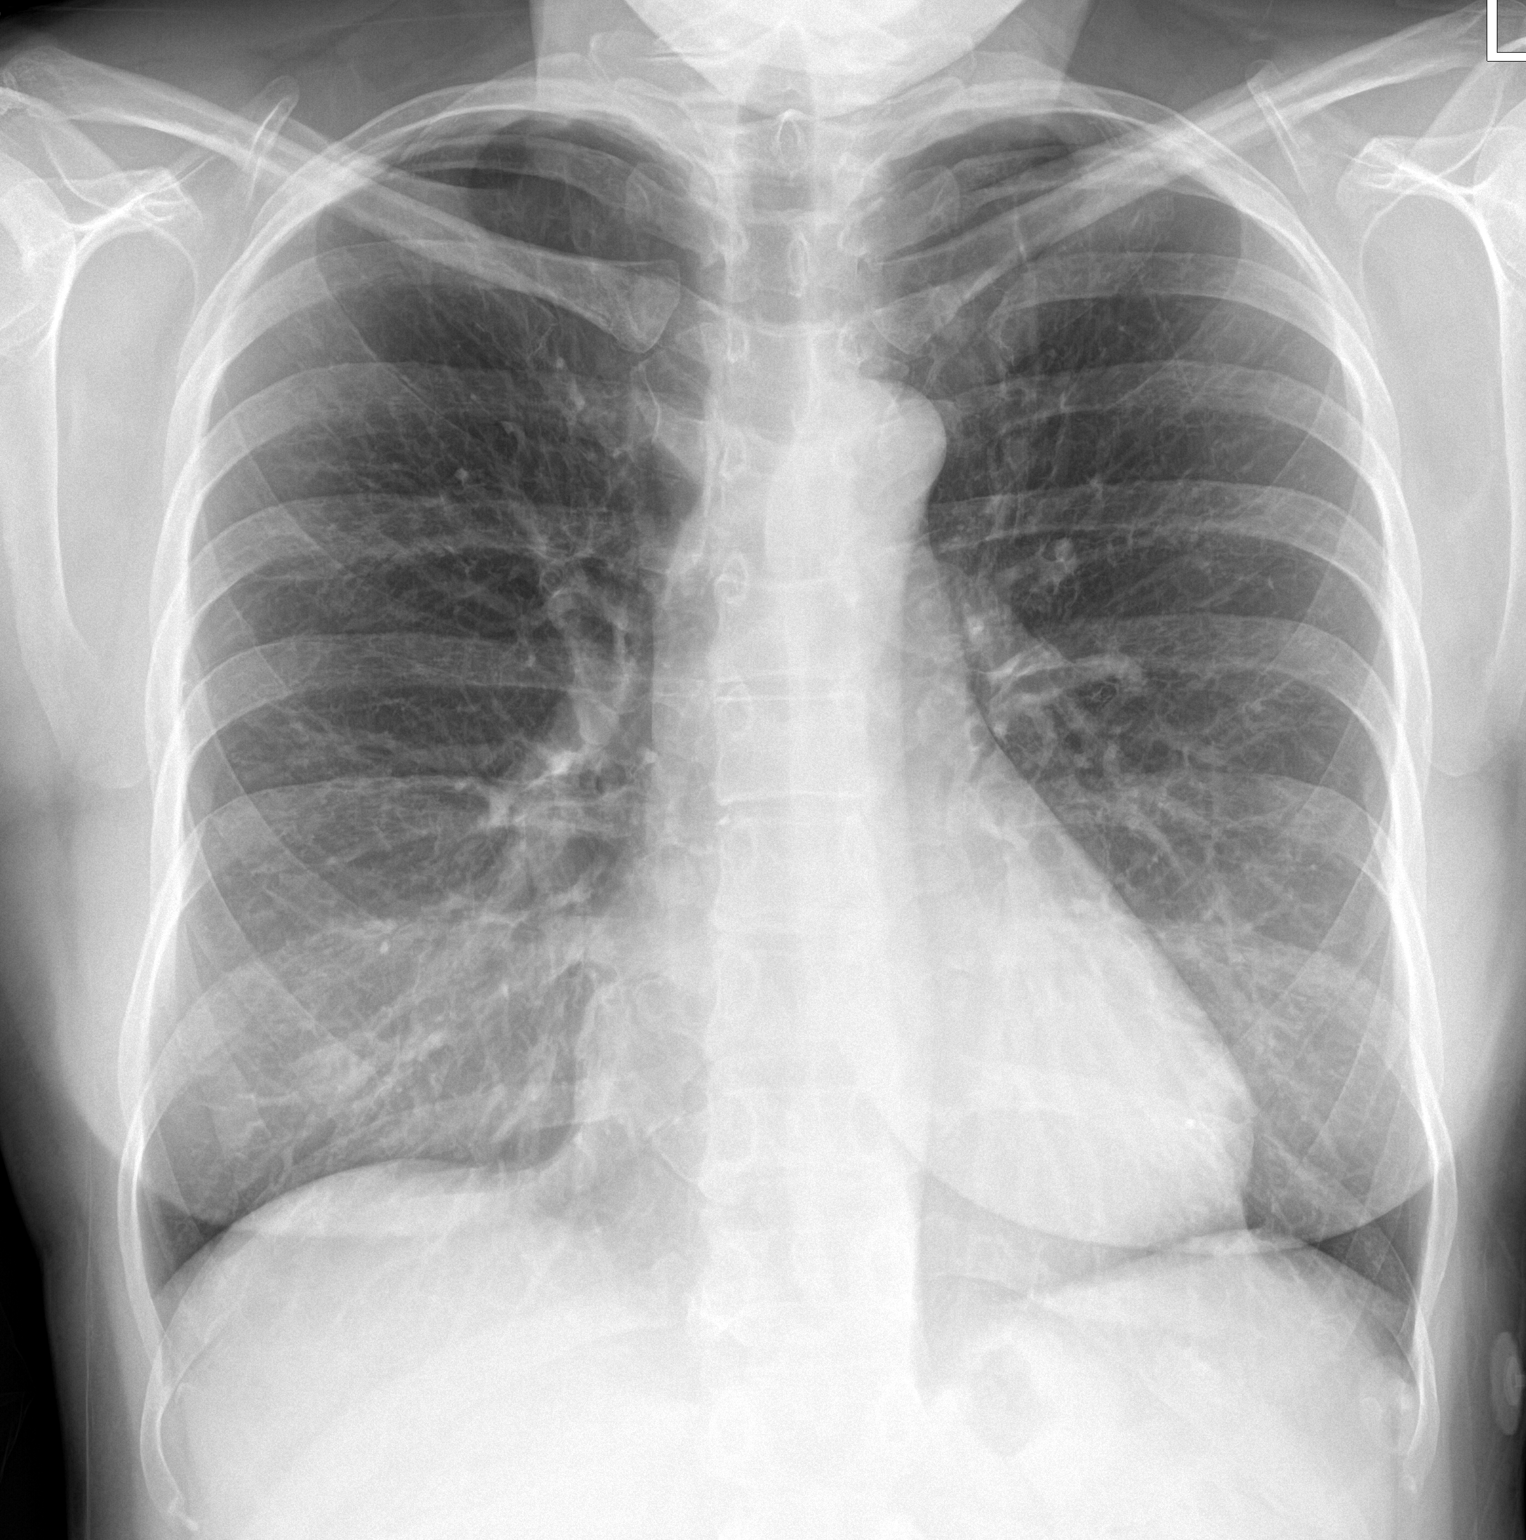

[chest lat]
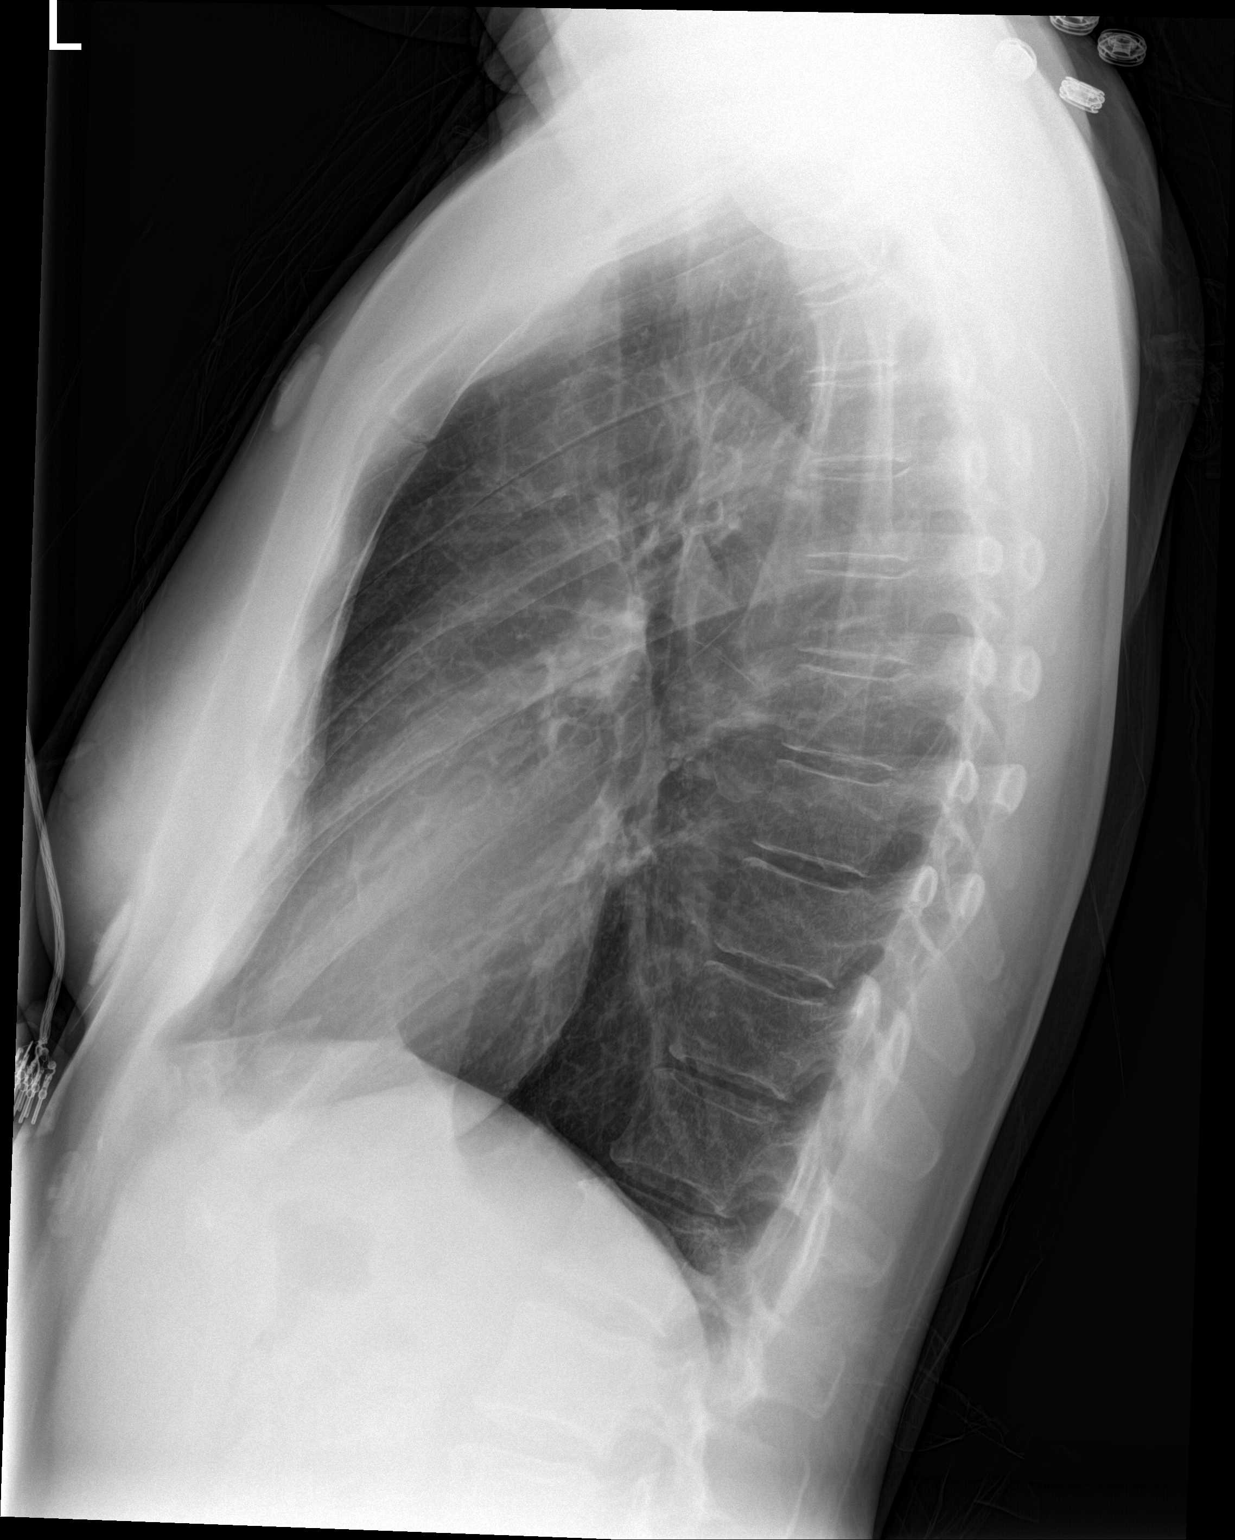

[2 of 2 positions shown; findings below may reference images not displayed]

FINDINGS: Mild cardiomegaly. Lungs hyper aerated and clear. No pneumothorax.
No pleural effusion. No mass or consolidation
IMPRESSION: No active cardiopulmonary disease.

## 2017-02-04 DIAGNOSIS — E0590 Thyrotoxicosis, unspecified without thyrotoxic crisis or storm: Secondary | ICD-10-CM | POA: Diagnosis not present

## 2017-02-06 DIAGNOSIS — E890 Postprocedural hypothyroidism: Secondary | ICD-10-CM | POA: Diagnosis not present

## 2017-02-06 DIAGNOSIS — E0500 Thyrotoxicosis with diffuse goiter without thyrotoxic crisis or storm: Secondary | ICD-10-CM | POA: Diagnosis not present

## 2017-03-19 ENCOUNTER — Other Ambulatory Visit: Payer: Self-pay | Admitting: Internal Medicine

## 2017-03-19 DIAGNOSIS — E785 Hyperlipidemia, unspecified: Secondary | ICD-10-CM

## 2017-03-23 DIAGNOSIS — S93601A Unspecified sprain of right foot, initial encounter: Secondary | ICD-10-CM | POA: Diagnosis not present

## 2017-03-25 DIAGNOSIS — F430 Acute stress reaction: Secondary | ICD-10-CM | POA: Diagnosis not present

## 2017-03-25 DIAGNOSIS — F419 Anxiety disorder, unspecified: Secondary | ICD-10-CM | POA: Diagnosis not present

## 2017-03-25 DIAGNOSIS — S92901D Unspecified fracture of right foot, subsequent encounter for fracture with routine healing: Secondary | ICD-10-CM | POA: Diagnosis not present

## 2017-03-26 DIAGNOSIS — S92355A Nondisplaced fracture of fifth metatarsal bone, left foot, initial encounter for closed fracture: Secondary | ICD-10-CM | POA: Diagnosis not present

## 2017-03-26 DIAGNOSIS — M79671 Pain in right foot: Secondary | ICD-10-CM | POA: Diagnosis not present

## 2017-04-10 DIAGNOSIS — S92354D Nondisplaced fracture of fifth metatarsal bone, right foot, subsequent encounter for fracture with routine healing: Secondary | ICD-10-CM | POA: Diagnosis not present

## 2017-04-23 DIAGNOSIS — F411 Generalized anxiety disorder: Secondary | ICD-10-CM | POA: Diagnosis not present

## 2017-05-01 DIAGNOSIS — S92354D Nondisplaced fracture of fifth metatarsal bone, right foot, subsequent encounter for fracture with routine healing: Secondary | ICD-10-CM | POA: Diagnosis not present

## 2017-05-08 DIAGNOSIS — K529 Noninfective gastroenteritis and colitis, unspecified: Secondary | ICD-10-CM | POA: Diagnosis not present

## 2017-05-15 ENCOUNTER — Other Ambulatory Visit: Payer: Self-pay | Admitting: Internal Medicine

## 2017-05-15 DIAGNOSIS — E785 Hyperlipidemia, unspecified: Secondary | ICD-10-CM

## 2017-06-11 ENCOUNTER — Other Ambulatory Visit: Payer: Self-pay | Admitting: Internal Medicine

## 2017-06-11 DIAGNOSIS — E785 Hyperlipidemia, unspecified: Secondary | ICD-10-CM

## 2017-06-12 ENCOUNTER — Other Ambulatory Visit: Payer: Self-pay | Admitting: Internal Medicine

## 2017-06-12 DIAGNOSIS — E785 Hyperlipidemia, unspecified: Secondary | ICD-10-CM

## 2017-06-13 DIAGNOSIS — F321 Major depressive disorder, single episode, moderate: Secondary | ICD-10-CM | POA: Diagnosis not present

## 2017-06-30 ENCOUNTER — Other Ambulatory Visit (HOSPITAL_BASED_OUTPATIENT_CLINIC_OR_DEPARTMENT_OTHER): Payer: BLUE CROSS/BLUE SHIELD

## 2017-06-30 ENCOUNTER — Ambulatory Visit (HOSPITAL_BASED_OUTPATIENT_CLINIC_OR_DEPARTMENT_OTHER): Payer: BLUE CROSS/BLUE SHIELD | Admitting: Oncology

## 2017-06-30 VITALS — BP 118/64 | HR 60 | Temp 97.8°F | Resp 18 | Ht 66.0 in | Wt 158.7 lb

## 2017-06-30 DIAGNOSIS — N951 Menopausal and female climacteric states: Secondary | ICD-10-CM

## 2017-06-30 DIAGNOSIS — D693 Immune thrombocytopenic purpura: Secondary | ICD-10-CM

## 2017-06-30 LAB — CBC WITH DIFFERENTIAL/PLATELET
BASO%: 0.6 % (ref 0.0–2.0)
BASOS ABS: 0 10*3/uL (ref 0.0–0.1)
EOS%: 2.1 % (ref 0.0–7.0)
Eosinophils Absolute: 0.1 10*3/uL (ref 0.0–0.5)
HEMATOCRIT: 40.1 % (ref 34.8–46.6)
HEMOGLOBIN: 13.4 g/dL (ref 11.6–15.9)
LYMPH#: 1.2 10*3/uL (ref 0.9–3.3)
LYMPH%: 34.7 % (ref 14.0–49.7)
MCH: 30 pg (ref 25.1–34.0)
MCHC: 33.4 g/dL (ref 31.5–36.0)
MCV: 89.7 fL (ref 79.5–101.0)
MONO#: 0.3 10*3/uL (ref 0.1–0.9)
MONO%: 8.8 % (ref 0.0–14.0)
NEUT%: 53.8 % (ref 38.4–76.8)
NEUTROS ABS: 1.8 10*3/uL (ref 1.5–6.5)
Platelets: 79 10*3/uL — ABNORMAL LOW (ref 145–400)
RBC: 4.47 10*6/uL (ref 3.70–5.45)
RDW: 12.6 % (ref 11.2–14.5)
WBC: 3.4 10*3/uL — AB (ref 3.9–10.3)

## 2017-06-30 NOTE — Progress Notes (Signed)
  [facility] OFFICE PROGRESS NOTE   Diagnosis: ITP  INTERVAL HISTORY:   Ashley Key returns as scheduled. No bleeding or bruising. She has intermittent hot flashes despite an estrogen patch. Good appetite. No fever.  Objective:  Vital signs in last 24 hours:  Blood pressure 118/64, pulse 60, temperature 97.8 F (36.6 C), temperature source Oral, resp. rate 18, height 5\' 6"  (1.676 m), weight 158 lb 11.2 oz (72 kg), SpO2 100 %.    HEENT: Neck without mass Lymphatics: Pea-sized left scalene node?Marland Kitchen No other cervical, supraclavicular, or axillary nodes Resp: Lungs clear bilaterally Cardio: Regular rate and rhythm GI: No hepatosplenomegaly Vascular: No leg edema  Skin: No ecchymoses seen over the trunk or extremities     Lab Results:  Lab Results  Component Value Date   WBC 3.4 (L) 06/30/2017   HGB 13.4 06/30/2017   HCT 40.1 06/30/2017   MCV 89.7 06/30/2017   PLT 79 (L) 06/30/2017   NEUTROABS 1.8 06/30/2017    Medications: I have reviewed the patient's current medications.  Assessment/Plan: 1.Severe thrombocytopenia 06/27/2014 -clinical presentation consistent with a diagnosis of ITP, improved with prednisone   Status post a prednisone taper completed 09/21/2014 2. bruising secondary to #1 -resolved  3. history of vaginal bleeding secondary to #1 , resolved 4. History of Shotty lymphadenopathy-likely benign  5. history of ADHD  6. mild leukopenia-neutropenia on presentation with ITP 06/27/2014  7. G0 P0  8. low level positive ANA  9. Hyperthyroidism, status post radioactive iodine therapy 07/12/2015    Disposition:  She has persistent thrombocytopenia secondary to ITP. There is no indication for treatment at present. She will contact us for spontaneous bleeding or bruising. She will return for a CBC in 6 months and an office visit in one year.  The hot flashes are likely related to menopause. She plans to see her gynecologist.  15 minutes  were spent with the patient today. The majority of the time was used for counseling and coordination of care.  Donneta Romberg, MD  06/30/2017  4:35 PM

## 2017-07-01 ENCOUNTER — Telehealth: Payer: Self-pay | Admitting: Oncology

## 2017-07-01 NOTE — Telephone Encounter (Signed)
Left message re January and July 2019 appointments. Schedule mailed.

## 2017-07-09 DIAGNOSIS — F4320 Adjustment disorder, unspecified: Secondary | ICD-10-CM | POA: Diagnosis not present

## 2017-07-11 DIAGNOSIS — E890 Postprocedural hypothyroidism: Secondary | ICD-10-CM | POA: Diagnosis not present

## 2017-07-11 DIAGNOSIS — F418 Other specified anxiety disorders: Secondary | ICD-10-CM | POA: Diagnosis not present

## 2017-07-18 ENCOUNTER — Other Ambulatory Visit: Payer: Self-pay | Admitting: Obstetrics and Gynecology

## 2017-07-18 ENCOUNTER — Ambulatory Visit
Admission: RE | Admit: 2017-07-18 | Discharge: 2017-07-18 | Disposition: A | Discharge status: Home / Self Care | Payer: BLUE CROSS/BLUE SHIELD | Source: Ambulatory Visit | Attending: Obstetrics and Gynecology | Admitting: Obstetrics and Gynecology

## 2017-07-18 ENCOUNTER — Other Ambulatory Visit (HOSPITAL_COMMUNITY)
Admission: RE | Admit: 2017-07-18 | Discharge: 2017-07-18 | Disposition: A | Discharge status: Home / Self Care | Payer: BLUE CROSS/BLUE SHIELD | Source: Ambulatory Visit | Attending: Obstetrics and Gynecology | Admitting: Obstetrics and Gynecology

## 2017-07-18 DIAGNOSIS — Z124 Encounter for screening for malignant neoplasm of cervix: Secondary | ICD-10-CM | POA: Insufficient documentation

## 2017-07-18 DIAGNOSIS — Z1231 Encounter for screening mammogram for malignant neoplasm of breast: Secondary | ICD-10-CM

## 2017-07-18 DIAGNOSIS — Z01411 Encounter for gynecological examination (general) (routine) with abnormal findings: Secondary | ICD-10-CM | POA: Diagnosis not present

## 2017-07-19 ENCOUNTER — Other Ambulatory Visit: Payer: Self-pay | Admitting: Internal Medicine

## 2017-07-19 DIAGNOSIS — E785 Hyperlipidemia, unspecified: Secondary | ICD-10-CM

## 2017-07-22 LAB — CYTOLOGY - PAP
DIAGNOSIS: NEGATIVE
HPV: NOT DETECTED

## 2017-09-09 DIAGNOSIS — F4311 Post-traumatic stress disorder, acute: Secondary | ICD-10-CM | POA: Diagnosis not present

## 2017-09-10 DIAGNOSIS — F321 Major depressive disorder, single episode, moderate: Secondary | ICD-10-CM | POA: Diagnosis not present

## 2017-09-10 DIAGNOSIS — R413 Other amnesia: Secondary | ICD-10-CM | POA: Diagnosis not present

## 2017-09-10 DIAGNOSIS — E890 Postprocedural hypothyroidism: Secondary | ICD-10-CM | POA: Diagnosis not present

## 2017-09-10 DIAGNOSIS — Z23 Encounter for immunization: Secondary | ICD-10-CM | POA: Diagnosis not present

## 2017-09-15 DIAGNOSIS — F4311 Post-traumatic stress disorder, acute: Secondary | ICD-10-CM | POA: Diagnosis not present

## 2017-09-17 DIAGNOSIS — F419 Anxiety disorder, unspecified: Secondary | ICD-10-CM | POA: Diagnosis not present

## 2017-09-17 DIAGNOSIS — D485 Neoplasm of uncertain behavior of skin: Secondary | ICD-10-CM | POA: Diagnosis not present

## 2017-09-17 DIAGNOSIS — L814 Other melanin hyperpigmentation: Secondary | ICD-10-CM | POA: Diagnosis not present

## 2017-09-17 DIAGNOSIS — F902 Attention-deficit hyperactivity disorder, combined type: Secondary | ICD-10-CM | POA: Diagnosis not present

## 2017-09-17 DIAGNOSIS — R41840 Attention and concentration deficit: Secondary | ICD-10-CM | POA: Diagnosis not present

## 2017-09-17 DIAGNOSIS — D1801 Hemangioma of skin and subcutaneous tissue: Secondary | ICD-10-CM | POA: Diagnosis not present

## 2017-09-17 DIAGNOSIS — D2271 Melanocytic nevi of right lower limb, including hip: Secondary | ICD-10-CM | POA: Diagnosis not present

## 2017-09-17 DIAGNOSIS — Z79899 Other long term (current) drug therapy: Secondary | ICD-10-CM | POA: Diagnosis not present

## 2017-09-22 DIAGNOSIS — F4311 Post-traumatic stress disorder, acute: Secondary | ICD-10-CM | POA: Diagnosis not present

## 2017-09-24 ENCOUNTER — Other Ambulatory Visit (HOSPITAL_COMMUNITY): Payer: BLUE CROSS/BLUE SHIELD | Admitting: Psychiatry

## 2017-09-25 ENCOUNTER — Other Ambulatory Visit (HOSPITAL_COMMUNITY): Payer: BLUE CROSS/BLUE SHIELD

## 2017-09-25 DIAGNOSIS — F4311 Post-traumatic stress disorder, acute: Secondary | ICD-10-CM | POA: Diagnosis not present

## 2017-09-26 ENCOUNTER — Other Ambulatory Visit (HOSPITAL_COMMUNITY): Payer: BLUE CROSS/BLUE SHIELD

## 2017-09-29 ENCOUNTER — Other Ambulatory Visit (HOSPITAL_COMMUNITY): Payer: BLUE CROSS/BLUE SHIELD

## 2017-09-29 DIAGNOSIS — F4311 Post-traumatic stress disorder, acute: Secondary | ICD-10-CM | POA: Diagnosis not present

## 2017-09-30 ENCOUNTER — Other Ambulatory Visit (HOSPITAL_COMMUNITY): Payer: BLUE CROSS/BLUE SHIELD

## 2017-10-01 ENCOUNTER — Other Ambulatory Visit (HOSPITAL_COMMUNITY): Payer: BLUE CROSS/BLUE SHIELD

## 2017-10-01 DIAGNOSIS — F4311 Post-traumatic stress disorder, acute: Secondary | ICD-10-CM | POA: Diagnosis not present

## 2017-10-02 ENCOUNTER — Other Ambulatory Visit (HOSPITAL_COMMUNITY): Payer: BLUE CROSS/BLUE SHIELD

## 2017-10-02 ENCOUNTER — Other Ambulatory Visit: Payer: Self-pay | Admitting: Family Medicine

## 2017-10-02 DIAGNOSIS — G919 Hydrocephalus, unspecified: Secondary | ICD-10-CM

## 2017-10-02 DIAGNOSIS — R350 Frequency of micturition: Secondary | ICD-10-CM

## 2017-10-02 DIAGNOSIS — R413 Other amnesia: Secondary | ICD-10-CM

## 2017-10-03 ENCOUNTER — Other Ambulatory Visit (HOSPITAL_COMMUNITY): Payer: BLUE CROSS/BLUE SHIELD

## 2017-10-06 ENCOUNTER — Other Ambulatory Visit (HOSPITAL_COMMUNITY): Payer: BLUE CROSS/BLUE SHIELD

## 2017-10-06 DIAGNOSIS — F4311 Post-traumatic stress disorder, acute: Secondary | ICD-10-CM | POA: Diagnosis not present

## 2017-10-07 ENCOUNTER — Other Ambulatory Visit (HOSPITAL_COMMUNITY): Payer: BLUE CROSS/BLUE SHIELD

## 2017-10-08 ENCOUNTER — Other Ambulatory Visit (HOSPITAL_COMMUNITY): Payer: BLUE CROSS/BLUE SHIELD

## 2017-10-09 ENCOUNTER — Other Ambulatory Visit (HOSPITAL_COMMUNITY): Payer: BLUE CROSS/BLUE SHIELD

## 2017-10-09 DIAGNOSIS — F4311 Post-traumatic stress disorder, acute: Secondary | ICD-10-CM | POA: Diagnosis not present

## 2017-10-10 ENCOUNTER — Other Ambulatory Visit (HOSPITAL_COMMUNITY): Payer: BLUE CROSS/BLUE SHIELD

## 2017-10-10 ENCOUNTER — Ambulatory Visit
Admission: RE | Admit: 2017-10-10 | Discharge: 2017-10-10 | Disposition: A | Discharge status: Home / Self Care | Payer: BLUE CROSS/BLUE SHIELD | Source: Ambulatory Visit | Attending: Family Medicine | Admitting: Family Medicine

## 2017-10-10 ENCOUNTER — Other Ambulatory Visit: Payer: Self-pay

## 2017-10-10 DIAGNOSIS — R413 Other amnesia: Secondary | ICD-10-CM | POA: Diagnosis not present

## 2017-10-10 DIAGNOSIS — G919 Hydrocephalus, unspecified: Secondary | ICD-10-CM

## 2017-10-10 DIAGNOSIS — R350 Frequency of micturition: Secondary | ICD-10-CM

## 2017-10-13 ENCOUNTER — Other Ambulatory Visit (HOSPITAL_COMMUNITY): Payer: BLUE CROSS/BLUE SHIELD

## 2017-10-13 DIAGNOSIS — K640 First degree hemorrhoids: Secondary | ICD-10-CM | POA: Diagnosis not present

## 2017-10-13 DIAGNOSIS — Z1211 Encounter for screening for malignant neoplasm of colon: Secondary | ICD-10-CM | POA: Diagnosis not present

## 2017-10-14 ENCOUNTER — Other Ambulatory Visit (HOSPITAL_COMMUNITY): Payer: BLUE CROSS/BLUE SHIELD

## 2017-10-14 DIAGNOSIS — F4311 Post-traumatic stress disorder, acute: Secondary | ICD-10-CM | POA: Diagnosis not present

## 2017-10-15 ENCOUNTER — Ambulatory Visit: Payer: BLUE CROSS/BLUE SHIELD | Admitting: Neurology

## 2017-10-15 ENCOUNTER — Other Ambulatory Visit (HOSPITAL_COMMUNITY): Payer: BLUE CROSS/BLUE SHIELD

## 2017-10-15 ENCOUNTER — Encounter: Payer: Self-pay | Admitting: Neurology

## 2017-10-15 DIAGNOSIS — R200 Anesthesia of skin: Secondary | ICD-10-CM | POA: Insufficient documentation

## 2017-10-15 DIAGNOSIS — R41840 Attention and concentration deficit: Secondary | ICD-10-CM | POA: Diagnosis not present

## 2017-10-15 DIAGNOSIS — R260 Ataxic gait: Secondary | ICD-10-CM | POA: Insufficient documentation

## 2017-10-15 DIAGNOSIS — R9082 White matter disease, unspecified: Secondary | ICD-10-CM | POA: Diagnosis not present

## 2017-10-15 DIAGNOSIS — R413 Other amnesia: Secondary | ICD-10-CM | POA: Diagnosis not present

## 2017-10-15 DIAGNOSIS — G3281 Cerebellar ataxia in diseases classified elsewhere: Secondary | ICD-10-CM | POA: Diagnosis not present

## 2017-10-15 HISTORY — DX: Other amnesia: R41.3

## 2017-10-15 HISTORY — DX: Anesthesia of skin: R20.0

## 2017-10-15 HISTORY — DX: White matter disease, unspecified: R90.82

## 2017-10-15 HISTORY — DX: Ataxic gait: R26.0

## 2017-10-15 HISTORY — DX: Attention and concentration deficit: R41.840

## 2017-10-15 NOTE — Progress Notes (Signed)
[facility]  PATIENT: Ashley Key DOB: 1966-01-30  REFERRING DOCTOR OR PCP:  London Pepper (PCP); Rachel Moulds (ADD) SOURCE: patient, notes from Dr. Orland Mustard, imaging/lab reports, MRI images on PACS  _________________________________   HISTORICAL  CHIEF COMPLAINT:  Chief Complaint  Patient presents with  . Memory Loss    Ashley Key is here for eval of memory loss onset about 18 mos. ago.   Treated by [facility] for ADHD sx.  Testing there did not confirm ADHD dx. and she was told memory problems may be due to anxiety/depression.  MRI brain done and is concerning for MS/fim  . Abnormal MRI    HISTORY OF PRESENT ILLNESS:  I had the pleasure seeing you patient, Ashley Key, at the [place] center at [facility] for neurologic consultation regarding her difficulties with memory and abnormal MRI.  Ashley Key is a 51 year old woman who began to note difficulties with her memory 18 months ago when she noted difficuty with focus and attention.   She noted more difficulties with her work.   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    Also, last year, she went to [facility] (Dr. Johnnye Sima) but she was felt not to have ADD after testing.   Adderall did not help her much.     She feels her memory deficits are mostly stable this year and her main worsening occurred in 2017.    She is currently not working and feels less stressed but also mildly depressed as she is not working.   She is doing puzzles and other activities to try to keep her mind stimulated.   She was placed on sertraline 3 years ago when mood initially worsened. She feels her some benefit from it.   She feels gait is usually good.    She is able to climb stairs and a step stool.    She fell in her tiered garden when she fell down the 2 foot drop and  broke her foot.   She had one or 2 other milder falls this year.   She does not note any difficulties with strength or sensation.  She has had bladder frequency and urge incontinence (drips) at times that has worsened the past 2 years and even more so over the past 6 months.   She feels vision is worse but she improved with her new glasses.  She denies color vision changes.   She notes more fatigue and more apathy.   Adderall did not help much.  She has insomnia with more trouble staying asleep than falling asleep.  She often wakes up at 2-4 am and then can't fall back asleep.  Her mind is often racing and she gets out of bed.    She denies headaches, rashes or joint pain.  She had ITP 06/2014 with a platelet count of 12.  She was treated with steroids and improved (Dr. Benay Spice) [name] there was persistent thrombocytopenia (last plt count = 79 and mild lymphopenia/leukopenia.  She also has had low level positive ANA.   She was diagnosed with hyperthyroidism 2 years ago and had radioactive treatment.   She is on Synthroid (112 mcg) and Dr. Orland Mustard is checking her labs.        I personally reviewed the MRI of the brain performed 10/10/2017. It is abnormal showing multiple T2/FLAIR hyperintense foci  in the juxtacortical, periventricular and deep white matter. Additionally there is a focus in the left thalamus, right midbrain, right pons and a subtle focus in the right cerebellar hemisphere and possibly at the cervicomedullary junction.  One of the juxtacortical foci on the right is mildly hyperintense on diffusion-weighted images and may be more acute or be artifact.     Montreal Cognitive Assessment  10/15/2017  Visuospatial/ Executive (0/5) 5  Naming (0/3) 3  Attention: Read list of digits (0/2) 2  Attention: Read list of letters (0/1) 1  Attention: Serial 7 subtraction starting at 100 (0/3) 3  Language: Repeat phrase (0/2) 2  Language : Fluency (0/1) 1  Abstraction (0/2) 2  Delayed Recall  (0/5) 0  Orientation (0/6) 6  Total 25  Adjusted Score (based on education) 25     REVIEW OF SYSTEMS: Constitutional: No fevers, chills, sweats, or change in appetite Eyes: No visual changes, double vision, eye pain Ear, nose and throat: No hearing loss, ear pain, nasal congestion, sore throat Cardiovascular: No chest pain, palpitations Respiratory: No shortness of breath at rest or with exertion.   No wheezes GastrointestinaI: No nausea, vomiting, diarrhea, abdominal pain, fecal incontinence Genitourinary: Urinary urgency and nocturia. Musculoskeletal: No neck pain, back pain Integumentary: No rash, pruritus, skin lesions Neurological: as above Psychiatric: mild depression Endocrine: No palpitations, diaphoresis, change in appetite, change in weigh or increased thirst Hematologic/Lymphatic: No anemia, purpura, petechiae. Allergic/Immunologic: No itchy/runny eyes, nasal congestion, recent allergic reactions, rashes  ALLERGIES: No Known Allergies  HOME MEDICATIONS:  Current Outpatient Medications:  .  amLODipine (NORVASC) 5 MG tablet, Take by mouth daily., Disp: , Rfl: 2 .  CALCIUM-VITAMIN D PO, Take 1 tablet by mouth daily., Disp: , Rfl:  .  COMBIPATCH 0.05-0.14 MG/DAY, , Disp: , Rfl:  .  Multiple Vitamins-Minerals (MULTIVITAMIN PO), Take 1 tablet by mouth daily., Disp: , Rfl:  .  Omega-3 Fatty Acids (FISH OIL) 1200 MG CAPS, Take 1,200 mg by mouth daily., Disp: , Rfl:  .  rosuvastatin (CRESTOR) 5 MG tablet, TAKE 1 TABLET(5 MG) BY MOUTH DAILY, Disp: 30 tablet, Rfl: 0 .  SYNTHROID 112 MCG tablet, TAKE 1 TABLET BY MOUTH ON AN EMPTY STOMACH 30 MINUTES BEFORE BREAKFAST, Disp: , Rfl: 5 .  sertraline (ZOLOFT) 100 MG tablet, TK 1 T PO ONCE D, Disp: , Rfl: 0  PAST MEDICAL HISTORY: Past Medical History:  Diagnosis Date  . ADHD (attention deficit hyperactivity disorder)   . Arch pain   . Arthritis    FEET  . Blood dyscrasia    NORMAL PLATLETS 50-70'S  . Bunion   . Graves  disease   . Headache    HISTORY MIGRAINE  . Hypertension   . ITP (idiopathic thrombocytopenic purpura)   . Pelvic fracture ([place])   . Pneumonia     PAST SURGICAL HISTORY: Past Surgical History:  Procedure Laterality Date  . FOOT SURGERY    . THYROID RADATION 2016    . WISDOM TOOTH EXTRACTION      FAMILY HISTORY: Family History  Problem Relation Age of Onset  . CAD Mother   . Heart attack Mother   . Stroke Father   . Heart attack Father   . CAD Brother   . Heart attack Brother   . Cancer Brother        prostate cancer  . Heart attack Maternal Grandmother   . Stroke Maternal Grandmother   . Stroke Paternal Grandmother     SOCIAL HISTORY:  Social  History   Socioeconomic History  . Marital status: Married    Spouse name: Collier Salina  . Number of children: 1  . Years of education: Not on file  . Highest education level: Not on file  Social Needs  . Financial resource strain: Not on file  . Food insecurity - worry: Not on file  . Food insecurity - inability: Not on file  . Transportation needs - medical: Not on file  . Transportation needs - non-medical: Not on file  Occupational History  . Occupation: Clinical research associate: [employer]  Tobacco Use  . Smoking status: Never Smoker  . Smokeless tobacco: Never Used  Substance and Sexual Activity  . Alcohol use: Yes    Comment: social  . Drug use: No  . Sexual activity: Yes  Other Topics Concern  . 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   General: The patient is well-developed and well-nourished and in no acute distress  Eyes:  Funduscopic exam  shows normal optic discs and retinal vessels.  Neck: The neck is supple, no carotid bruits are noted.  The neck is nontender.  Cardiovascular: The heart has a regular rate and rhythm with a normal S1 and S2. There were no murmurs, gallops or rubs. Lungs are clear to auscultation.  Skin: Extremities are without significant edema.  Musculoskeletal:  Back is nontender  Neurologic Exam  Mental status: The patient is alert and oriented x 3 at the time of the examination. The patient has reduced short-term recall. But good attention.  Her score on the atrial cognitive assessment was 25/30, losing 5 points on short-term recall.   Speech is normal.  Cranial nerves: Extraocular movements are full. Pupils are equal, round, and reactive to light and accomodation.  Color vision isVisual fields are full.  Facial symmetry is present. There is good facial sensation to soft touch bilaterally.Facial strength is normal.  Trapezius and sternocleidomastoid strength is normal. No dysarthria is noted.  The tongue is midline, and the patient has symmetric elevation of the soft palate. No obvious hearing deficits are noted.  Motor:  Muscle bulk is normal.   Tone is normal. Strength is  5 / 5 in all 4 extremities.   Sensory: Sensory testing showed reduced vibration sensation in the right arm/hand relative to the left. The legs were more symmetric. There was also reduced temperature sensation in the right hand relative to the left. Touch sensation was more symmetric in the arms and legs exept the middle toe on the right is numb (other toes symmetric)  Coordination: Cerebellar testing reveals good finger-nose-finger and heel-to-shin bilaterally.  Gait and station: Station is normal.   Gait is normal. Tandem gait is wide. Romberg is negative.   Reflexes: Deep tendon reflexes are symmetric and 2 in arms and 3 in legs bilaterally. No ankle clonus.   Plantar responses are flexor.    DIAGNOSTIC DATA (LABS, IMAGING,  TESTING) - I reviewed patient records, labs, notes, testing and  imaging myself where available.  Lab Results  Component Value Date   WBC 3.4 (L) 06/30/2017   HGB 13.4 06/30/2017   HCT 40.1 06/30/2017   MCV 89.7 06/30/2017   PLT 79 (L) 06/30/2017      Component Value Date/Time   NA 136 12/10/2015 0930   K 4.4 12/10/2015 0930   CL 107 12/10/2015 0930   CO2 22 12/10/2015 0930   GLUCOSE 94 12/10/2015 0930   BUN 21 (H) 12/10/2015 0930   CREATININE 0.66 12/10/2015 0930   CALCIUM 8.6 (L) 12/10/2015 0930   PROT 6.6 06/28/2014 0358   ALBUMIN 3.6 06/28/2014 0358   AST 17 06/28/2014 0358   ALT 8 06/28/2014 0358   ALKPHOS 41 06/28/2014 0358   BILITOT 0.3 06/28/2014 0358   GFRNONAA >60 12/10/2015 0930   GFRAA >60 12/10/2015 0930   Lab Results  Component Value Date   CHOL 224 (H) 01/22/2016   HDL 72 01/22/2016   LDLCALC 139 (H) 01/22/2016   TRIG 64 01/22/2016   CHOLHDL 3.1 01/22/2016   No results found for: HGBA1C Lab Results  Component Value Date   VITAMINB12 >2,000 (H) 06/28/2014   Lab Results  Component Value Date   TSH 0.016 (L) 06/28/2014       ASSESSMENT AND PLAN  Cerebellar ataxia in diseases classified elsewhere ([place]) - Plan: MR BRAIN W CONTRAST, MR CERVICAL SPINE W WO CONTRAST  Memory loss - Plan: MR BRAIN W CONTRAST, Pan-ANCA, TSH, VITAMIN D 25 Hydroxy (Vit-D Deficiency, Fractures)  White matter abnormality on MRI of brain - Plan: Pan-ANCA, TSH, VITAMIN D 25 Hydroxy (Vit-D Deficiency, Fractures)  Numbness  Ataxic gait  Attention deficit - Plan: TSH, VITAMIN D 25 Hydroxy (Vit-D Deficiency, Fractures)   In summary, Iliany Losier is a 51 year old woman for short-term memory difficulty who was found to have an abnormal brain MRI, worrisome for multiple sclerosis. Also on examination she has mild ataxia and increased reflexes, signs that could also be related to multiple sclerosis. I had a long discussion with her about the significance of her exam and MRI. I  feel that there is greater than a 50% likelihood that she does have MS. However, I like to be more certain before starting a person on long-term therapy that could have side effects. We will check an MRI of the brain with contrast (MRI last week was performed without) to determine if any of the foci are acute. If we have a combination of enhancing and nonenhancing lesions, this greatly increases the likelihood of MS. Additionally we will check an MRI of the cervical spine due to her mild gait issues, hyperreflexia and urinary dysfunction that could be consistent with a demyelinating transverse myelitis from MS. If we see any cervical spine lesions, this also greatly increases the likelihood that she has MS. If either of these 2 MRIs show these additional findings, I will start her on a disease modifying therapy. Otherwise, I will need to proceed with a lumbar puncture for CSF analysis. If that is abnormal, consistent with MS, then I will also start a disease modifying therapy. If we do need to start a DMT, consideration will have to be made that she has ITP with mild thrombocytopenia and leukopenia.  She will return to see me in 3 weeks for further evaluation and/or treatment. She will call sooner if she has new or worsening neurologic symptoms.  Thank you for asking me to see Ms. [name] for a neurologic consultation at the [place] center. Please let  me know if I can be of further assistance with her or other patients in the future.    Richard A. Felecia Shelling, MD, PhD, FAAN Certified in Neurology, Clinical Neurophysiology, Sleep Medicine, Pain Medicine and Neuroimaging Director, [place] at [facility] [address] [place], [place] [address] [phone]

## 2017-10-16 ENCOUNTER — Other Ambulatory Visit (HOSPITAL_COMMUNITY): Payer: BLUE CROSS/BLUE SHIELD

## 2017-10-17 ENCOUNTER — Other Ambulatory Visit (HOSPITAL_COMMUNITY): Payer: BLUE CROSS/BLUE SHIELD

## 2017-10-17 ENCOUNTER — Telehealth: Payer: Self-pay | Admitting: *Deleted

## 2017-10-17 LAB — PAN-ANCA
ANCA Proteinase 3: <3.5 U/mL (ref 0.0–3.5)
Atypical pANCA: <1:20 {titer}
C-ANCA: <1:20 {titer}
P-ANCA: <1:20 {titer}

## 2017-10-17 LAB — TSH: TSH: 1.74 u[IU]/mL (ref 0.450–4.500)

## 2017-10-17 LAB — VITAMIN D 25 HYDROXY (VIT D DEFICIENCY, FRACTURES): Vit D, 25-Hydroxy: 33.5 ng/mL (ref 30.0–100.0)

## 2017-10-17 NOTE — Telephone Encounter (Signed)
LMOM (identified vm) that lab work done in our office looks ok--Vit. D level is low normal (33, and normal is 30-100).  Dr. Felecia Shelling would like her to take otc Vit. D 2,000-5,000 iu once daily.  She does not need to return this call unless she has questions/fim

## 2017-10-17 NOTE — Telephone Encounter (Signed)
-----   Message from Britt Bottom, MD sent at 10/17/2017  8:13 AM EST ----- Please let the patient know that the lab work is fine.  vit D is low normal so should take 2000-5000 U OTC daily

## 2017-10-20 ENCOUNTER — Other Ambulatory Visit (HOSPITAL_COMMUNITY): Payer: BLUE CROSS/BLUE SHIELD

## 2017-10-20 DIAGNOSIS — F4311 Post-traumatic stress disorder, acute: Secondary | ICD-10-CM | POA: Diagnosis not present

## 2017-10-21 ENCOUNTER — Ambulatory Visit (INDEPENDENT_AMBULATORY_CARE_PROVIDER_SITE_OTHER): Payer: BLUE CROSS/BLUE SHIELD

## 2017-10-21 ENCOUNTER — Other Ambulatory Visit (HOSPITAL_COMMUNITY): Payer: BLUE CROSS/BLUE SHIELD

## 2017-10-21 DIAGNOSIS — R413 Other amnesia: Secondary | ICD-10-CM | POA: Diagnosis not present

## 2017-10-21 DIAGNOSIS — G3281 Cerebellar ataxia in diseases classified elsewhere: Secondary | ICD-10-CM

## 2017-10-21 MED ORDER — GADOPENTETATE DIMEGLUMINE 469.01 MG/ML IV SOLN: 15.0000 mL | Freq: Once | INTRAVENOUS | Status: DC | PRN

## 2017-10-22 ENCOUNTER — Other Ambulatory Visit (HOSPITAL_COMMUNITY): Payer: BLUE CROSS/BLUE SHIELD

## 2017-10-22 ENCOUNTER — Telehealth: Payer: Self-pay | Admitting: Neurology

## 2017-10-22 DIAGNOSIS — F4311 Post-traumatic stress disorder, acute: Secondary | ICD-10-CM | POA: Diagnosis not present

## 2017-10-22 NOTE — Telephone Encounter (Signed)
The MRI of the cervical spine shows one spot near the cervicomedullary junction and the MRI of the brain with contrast showed 1 subtle enhancing lesion. Thgerefore, we can be more certain of the diagnosis of multiple sclerosis.  I would like to see her back to discuss options. She has ITP and reduction in all ocrelizumab might be an option that can hold both MS and the ITP. Alternatively, I can place her on one of the other medications.

## 2017-10-23 ENCOUNTER — Encounter: Payer: Self-pay | Admitting: Neurology

## 2017-10-23 ENCOUNTER — Other Ambulatory Visit: Payer: Self-pay

## 2017-10-23 ENCOUNTER — Ambulatory Visit: Payer: BLUE CROSS/BLUE SHIELD | Admitting: Neurology

## 2017-10-23 ENCOUNTER — Other Ambulatory Visit (HOSPITAL_COMMUNITY): Payer: BLUE CROSS/BLUE SHIELD

## 2017-10-23 VITALS — BP 128/90 | HR 64 | Resp 16 | Ht 66.0 in | Wt 166.0 lb

## 2017-10-23 DIAGNOSIS — R200 Anesthesia of skin: Secondary | ICD-10-CM | POA: Diagnosis not present

## 2017-10-23 DIAGNOSIS — R41840 Attention and concentration deficit: Secondary | ICD-10-CM

## 2017-10-23 DIAGNOSIS — R413 Other amnesia: Secondary | ICD-10-CM

## 2017-10-23 DIAGNOSIS — G35 Multiple sclerosis: Secondary | ICD-10-CM

## 2017-10-23 DIAGNOSIS — D693 Immune thrombocytopenic purpura: Secondary | ICD-10-CM

## 2017-10-23 DIAGNOSIS — R260 Ataxic gait: Secondary | ICD-10-CM

## 2017-10-23 DIAGNOSIS — Z79899 Other long term (current) drug therapy: Secondary | ICD-10-CM

## 2017-10-23 HISTORY — DX: Multiple sclerosis: G35

## 2017-10-23 MED ORDER — AMPHETAMINE-DEXTROAMPHET ER 15 MG PO CP24: 15.0000 mg | ORAL_CAPSULE | ORAL | 0 refills | Status: DC

## 2017-10-23 NOTE — Progress Notes (Signed)
[facility]  PATIENT: Ashley Key DOB: 08-24-1966  REFERRING DOCTOR OR PCP:  Ashley Key (PCP); Ashley Key (ADD) SOURCE: patient, notes from Ashley Key, imaging/lab reports, MRI images on PACS  _________________________________   HISTORICAL  CHIEF COMPLAINT:  Chief Complaint  Patient presents with  . Abnormal MRI    Here to discuss MRI results, tx. options/fim  . Memory Loss    HISTORY OF PRESENT ILLNESS:  Ashley Key is a 51 year old woman with memory difficulties and gait disturbance who was found to have an MRI of brain and spinal cord consistent with MS.  Update 10/23/2017:   I reviewed the recent MRI of the brain and cervical spine with Ashley Key. The MRI of the cervical spine shows a focus in the mid spinal cord at the cervicomedullary junction. The MRI of the brain with contrast (previously had without) showed 1 subtle small enhancing focus. The combination of her symptoms and MRIs are consistent with multiple sclerosis and we discussed therapeutic options..  We discussed treatment options. As she has potential for a more progressive MS (has a spinal plaque and older symptoms were her first evidence of disease) and she has ITP, ocrelizumab, an anti-CD20 agent similar to Rituxan, maybe an excellent choice for her MS and also help the ITP.   Alternatively, Tecfidera is a reasonable agent to begin with with MS  Her hematologist for her ITP is Ashley Key.     From 10/15/2017: Ashley Key is a 51 year old woman who began to note difficulties with her memory 18 months ago when she noted difficuty with focus and attention.   She noted more difficulties with her work.   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    Also, last year, she went to [place] [facility] (Ashley Key) but she was felt not to have ADD after testing.    Adderall did not help her much.     She feels her memory deficits are mostly stable this year and her main worsening occurred in 2017.    She is currently not working and feels less stressed but also mildly depressed as she is not working.   She is doing puzzles and other activities to try to keep her mind stimulated.   She was placed on sertraline 3 years ago when mood initially worsened. She feels her some benefit from it.   She feels gait is usually good.    She is able to climb stairs and a step stool.    She fell in her tiered garden when she fell down the 2 foot drop and broke her foot.   She had one or 2 other milder falls this year.   She does not note any difficulties with strength or sensation.  She has had bladder frequency and urge incontinence (drips) at times that has worsened the past 2 years and even more so over the past 6 months.   She feels vision is worse but she improved with her new glasses.  She denies color vision changes.   She notes more fatigue and more apathy.   Adderall did not help much.  She has insomnia with more trouble staying asleep than falling asleep.  She often wakes up at 2-4 am and then can't fall back asleep.  Her mind is often racing and she gets out of bed.    She denies  headaches, rashes or joint pain.  She had ITP 06/2014 with a platelet count of 12.  She was treated with steroids and improved (Ashley Key) Ashley Key there was persistent thrombocytopenia (last plt count = 79 and mild lymphopenia/leukopenia.  She also has had low level positive ANA.   She was diagnosed with hyperthyroidism 2 years ago and had radioactive treatment.   She is on Synthroid (112 mcg) and Ashley Key is checking her labs.        I personally reviewed the MRI of the brain performed 10/10/2017. It is abnormal showing multiple T2/FLAIR hyperintense foci in the juxtacortical, periventricular and deep white matter. Additionally there is a focus in the left thalamus, right midbrain, right  pons and a subtle focus in the right cerebellar hemisphere and possibly at the cervicomedullary junction.  One of the juxtacortical foci on the right is mildly hyperintense on diffusion-weighted images and may be more acute or be artifact.     Montreal Cognitive Assessment  10/15/2017  Visuospatial/ Executive (0/5) 5  Naming (0/3) 3  Attention: Read list of digits (0/2) 2  Attention: Read list of letters (0/1) 1  Attention: Serial 7 subtraction starting at 100 (0/3) 3  Language: Repeat phrase (0/2) 2  Language : Fluency (0/1) 1  Abstraction (0/2) 2  Delayed Recall (0/5) 0  Orientation (0/6) 6  Total 25  Adjusted Score (based on education) 25     REVIEW OF SYSTEMS: Constitutional: No fevers, chills, sweats, or change in appetite Eyes: No visual changes, double vision, eye pain Ear, nose and throat: No hearing loss, ear pain, nasal congestion, sore throat Cardiovascular: No chest pain, palpitations Respiratory: No shortness of breath at rest or with exertion.   No wheezes GastrointestinaI: No nausea, vomiting, diarrhea, abdominal pain, fecal incontinence Genitourinary: Urinary urgency and nocturia. Musculoskeletal: No neck pain, back pain Integumentary: No rash, pruritus, skin lesions Neurological: as above Psychiatric: mild depression Endocrine: No palpitations, diaphoresis, change in appetite, change in weigh or increased thirst Hematologic/Lymphatic: No anemia, purpura, petechiae. Allergic/Immunologic: No itchy/runny eyes, nasal congestion, recent allergic reactions, rashes  ALLERGIES: No Known Allergies  HOME MEDICATIONS:  Current Outpatient Medications:  .  amLODipine (NORVASC) 5 MG tablet, Take by mouth daily., Disp: , Rfl: 2 .  CALCIUM-VITAMIN D PO, Take 1 tablet by mouth daily., Disp: , Rfl:  .  COMBIPATCH 0.05-0.14 MG/DAY, , Disp: , Rfl:  .  Multiple Vitamins-Minerals (MULTIVITAMIN PO), Take 1 tablet by mouth daily., Disp: , Rfl:  .  Omega-3 Fatty Acids (FISH  OIL) 1200 MG CAPS, Take 1,200 mg by mouth daily., Disp: , Rfl:  .  rosuvastatin (CRESTOR) 5 MG tablet, TAKE 1 TABLET(5 MG) BY MOUTH DAILY, Disp: 30 tablet, Rfl: 0 .  sertraline (ZOLOFT) 100 MG tablet, TK 1 T PO ONCE D, Disp: , Rfl: 0 .  SYNTHROID 112 MCG tablet, TAKE 1 TABLET BY MOUTH ON AN EMPTY STOMACH 30 MINUTES BEFORE BREAKFAST, Disp: , Rfl: 5 .  amphetamine-dextroamphetamine (ADDERALL XR) 15 MG 24 hr capsule, Take 1 capsule every morning by mouth., Disp: 30 capsule, Rfl: 0 No current facility-administered medications for this visit.   Facility-Administered Medications Ordered in Other Visits:  .  gadopentetate dimeglumine (MAGNEVIST) injection 15 mL, 15 mL, Intravenous, Once PRN, [name], Nanine Means, MD  PAST MEDICAL HISTORY: Past Medical History:  Diagnosis Date  . ADHD (attention deficit hyperactivity disorder)   . Arch pain   . Arthritis    FEET  . Blood dyscrasia    NORMAL PLATLETS  50-70'S  . Bunion   . Graves disease   . Headache    HISTORY MIGRAINE  . Hypertension   . ITP (idiopathic thrombocytopenic purpura)   . Pelvic fracture ([place])   . Pneumonia     PAST SURGICAL HISTORY: Past Surgical History:  Procedure Laterality Date  . DILITATION & CURRETTAGE/HYSTROSCOPY WITH HYDROTHERMAL ABLATION N/A 09/27/2015   Procedure: DILATATION & CURETTAGE/HYSTEROSCOPY WITH HYDROTHERMAL ABLATION;  Surgeon: Thurnell Lose, MD;  Location: [facility];  Service: Gynecology;  Laterality: N/A;  ultrasound guidance   . FOOT SURGERY    . THYROID RADATION 2016    . WISDOM TOOTH EXTRACTION      FAMILY HISTORY: Family History  Problem Relation Age of Onset  . CAD Mother   . Heart attack Mother   . Stroke Father   . Heart attack Father   . CAD Brother   . Heart attack Brother   . Cancer Brother        prostate cancer  . Heart attack Maternal Grandmother   . Stroke Maternal Grandmother   . Stroke Paternal Grandmother     SOCIAL HISTORY:  Social History   Socioeconomic History  .  Marital status: Married    Spouse name: Collier Salina  . Number of children: 1  . Years of education: Not on file  . Highest education level: Not on file  Social Needs  . Financial resource strain: Not on file  . Food insecurity - worry: Not on file  . Food insecurity - inability: Not on file  . Transportation needs - medical: Not on file  . Transportation needs - non-medical: Not on file  Occupational History  . Occupation: Clinical research associate: [employer]  Tobacco Use  . Smoking status: Never Smoker  . Smokeless tobacco: Never Used  Substance and Sexual Activity  . Alcohol use: Yes    Comment: social  . Drug use: No  . Sexual activity: Yes  Other Topics Concern  . 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   General: The patient is well-developed and well-nourished and in no acute distress   Neurologic Exam  Mental status: The patient is alert and oriented x 3 at the time of the examination. The patient has reduced short-term recall. But good attention.  Her score on the atrial cognitive assessment was 25/30, losing 5 points on short-term recall.   Speech is normal.  Cranial nerves: Extraocular movements are full. Facial strength and sensation is normal.  The tongue is midline, and the patient has symmetric elevation of the soft palate. No obvious hearing deficits are noted.  Motor:  Muscle bulk is normal.   Tone is normal. Strength is  5 / 5 in all 4 extremities.   Sensory: Sensory testing showed reduced vibration sensation in the right arm/hand relative to the left. The legs were more symmetric. There was also  reduced temperature sensation in the right hand relative to the left. Touch sensation was more symmetric in  the arms and legs exept the middle toe on the right is numb (other toes symmetric)  Coordination: There is good finger-nose-finger and slightly reduced heel-to-shin bilaterally.  Gait and station: Station is normal.   Her gait is fairly normal though the tandem gait is wide.. Romberg is negative.   Reflexes: Deep tendon reflexes are symmetric and 2 in arms and 3 in legs bilaterally with spread at the knees. No ankle clonus.       DIAGNOSTIC DATA (LABS, IMAGING, TESTING) - I reviewed patient records, labs, notes, testing and imaging myself where available.  Lab Results  Component Value Date   WBC 3.4 (L) 06/30/2017   HGB 13.4 06/30/2017   HCT 40.1 06/30/2017   MCV 89.7 06/30/2017   PLT 79 (L) 06/30/2017      Component Value Date/Time   NA 136 12/10/2015 0930   K 4.4 12/10/2015 0930   CL 107 12/10/2015 0930   CO2 22 12/10/2015 0930   GLUCOSE 94 12/10/2015 0930   BUN 21 (H) 12/10/2015 0930   CREATININE 0.66 12/10/2015 0930   CALCIUM 8.6 (L) 12/10/2015 0930   PROT 6.6 06/28/2014 0358   ALBUMIN 3.6 06/28/2014 0358   AST 17 06/28/2014 0358   ALT 8 06/28/2014 0358   ALKPHOS 41 06/28/2014 0358   BILITOT 0.3 06/28/2014 0358   GFRNONAA >60 12/10/2015 0930   GFRAA >60 12/10/2015 0930   Lab Results  Component Value Date   CHOL 224 (H) 01/22/2016   HDL 72 01/22/2016   LDLCALC 139 (H) 01/22/2016   TRIG 64 01/22/2016   CHOLHDL 3.1 01/22/2016   No results found for: HGBA1C Lab Results  Component Value Date   VITAMINB12 >2,000 (H) 06/28/2014   Lab Results  Component Value Date   TSH 1.740 10/15/2017       ASSESSMENT AND PLAN  Multiple sclerosis ([place]) - Plan: Hepatitis B core antibody, total, Hepatitis B surface antigen, QuantiFERON-TB Gold Plus, Hepatitis B surface antibody, Hepatic function panel, Stratify JCV Antibody Test ([facility]), CBC with Differential/Platelet,  CANCELED: CBC with Differential/Platelet  Idiopathic thrombocytopenic purpura ([facility])  Numbness  Ataxic gait  Memory loss  Attention deficit  High risk medication use - Plan: Hepatitis B core antibody, total, Hepatitis B surface antigen, QuantiFERON-TB Gold Plus, Hepatitis B surface antibody, Hepatic function panel, Stratify JCV Antibody Test ([facility]), CBC with Differential/Platelet, CANCELED: CBC with Differential/Platelet   1.   We discussed treatment options for her newly diagnosed MS. She has potential to have a more aggressive course as her first symptom is attributable to her spinal cord plaque and she also has early cognitive changes. Therefore, ocrelizumab might be her best option. Additionally, it might help her mild chronic ITP as it is an anti--CD20 monoclonal antibody.    I will run this by Ashley Key of hematology to make sure that there is no contraindication. If there is, consider Tecfidera which would have less effect on the lymphocytes. Appropriate blood work will be checked. 2.    Other symptoms include bladder dysfunction. Time being she would prefer not to start a drug for this symptom. She also has reduced focus and attention due to cognitive dysfunction from the MS and fatigue. I will have her try Adderall XR. 3.   She will return for her infusion and follow-up with me for regular visit in about 3 months. She should call with any new or worsening neurologic symptoms.  Richard A. Felecia Shelling, MD, PhD, FAAN Certified in Neurology, Clinical Neurophysiology, Sleep Medicine, Pain Medicine  and Administrator, sports, [place] [facility] [address] [place], [place] [address] [phone]

## 2017-10-24 ENCOUNTER — Other Ambulatory Visit (HOSPITAL_COMMUNITY): Payer: BLUE CROSS/BLUE SHIELD

## 2017-10-26 LAB — CBC WITH DIFFERENTIAL/PLATELET
BASOS ABS: 0 10*3/uL (ref 0.0–0.2)
Basos: 1 %
EOS (ABSOLUTE): 0.1 10*3/uL (ref 0.0–0.4)
EOS: 2 %
HEMATOCRIT: 40.2 % (ref 34.0–46.6)
HEMOGLOBIN: 13.6 g/dL (ref 11.1–15.9)
Immature Grans (Abs): 0 10*3/uL (ref 0.0–0.1)
Immature Granulocytes: 0 %
LYMPHS ABS: 1.2 10*3/uL (ref 0.7–3.1)
Lymphs: 31 %
MCH: 29.6 pg (ref 26.6–33.0)
MCHC: 33.8 g/dL (ref 31.5–35.7)
MCV: 88 fL (ref 79–97)
MONOS ABS: 0.3 10*3/uL (ref 0.1–0.9)
Monocytes: 8 %
NEUTROS ABS: 2.3 10*3/uL (ref 1.4–7.0)
Neutrophils: 58 %
Platelets: 108 10*3/uL — ABNORMAL LOW (ref 150–379)
RBC: 4.59 x10E6/uL (ref 3.77–5.28)
RDW: 13.8 % (ref 12.3–15.4)
WBC: 3.9 10*3/uL (ref 3.4–10.8)

## 2017-10-26 LAB — QUANTIFERON-TB GOLD PLUS
QUANTIFERON NIL VALUE: 0.03 [IU]/mL
QuantiFERON Mitogen Value: 9.58 IU/mL
QuantiFERON TB1 Ag Value: 0.03 IU/mL
QuantiFERON TB2 Ag Value: 0.03 IU/mL
QuantiFERON-TB Gold Plus: NEGATIVE

## 2017-10-26 LAB — HEPATIC FUNCTION PANEL
ALBUMIN: 4.5 g/dL (ref 3.5–5.5)
ALT: 11 IU/L (ref 0–32)
AST: 17 IU/L (ref 0–40)
Alkaline Phosphatase: 57 IU/L (ref 39–117)
BILIRUBIN TOTAL: 0.2 mg/dL (ref 0.0–1.2)
Bilirubin, Direct: 0.08 mg/dL (ref 0.00–0.40)
Total Protein: 7.2 g/dL (ref 6.0–8.5)

## 2017-10-26 LAB — HEPATITIS B SURFACE ANTIGEN: Hepatitis B Surface Ag: NEGATIVE

## 2017-10-26 LAB — HEPATITIS B SURFACE ANTIBODY,QUALITATIVE: HEP B SURFACE AB, QUAL: REACTIVE

## 2017-10-26 LAB — HEPATITIS B CORE ANTIBODY, TOTAL: HEP B C TOTAL AB: NEGATIVE

## 2017-10-27 ENCOUNTER — Other Ambulatory Visit (HOSPITAL_COMMUNITY): Payer: BLUE CROSS/BLUE SHIELD

## 2017-10-27 DIAGNOSIS — F4311 Post-traumatic stress disorder, acute: Secondary | ICD-10-CM | POA: Diagnosis not present

## 2017-10-28 ENCOUNTER — Other Ambulatory Visit (HOSPITAL_COMMUNITY): Payer: BLUE CROSS/BLUE SHIELD

## 2017-10-29 ENCOUNTER — Other Ambulatory Visit (HOSPITAL_COMMUNITY): Payer: BLUE CROSS/BLUE SHIELD

## 2017-10-29 ENCOUNTER — Encounter: Payer: Self-pay | Admitting: *Deleted

## 2017-10-29 DIAGNOSIS — G35 Multiple sclerosis: Secondary | ICD-10-CM | POA: Diagnosis not present

## 2017-10-29 DIAGNOSIS — R413 Other amnesia: Secondary | ICD-10-CM | POA: Diagnosis not present

## 2017-10-31 ENCOUNTER — Other Ambulatory Visit (HOSPITAL_COMMUNITY): Payer: BLUE CROSS/BLUE SHIELD

## 2017-11-03 ENCOUNTER — Other Ambulatory Visit (HOSPITAL_COMMUNITY): Payer: BLUE CROSS/BLUE SHIELD

## 2017-11-03 ENCOUNTER — Telehealth: Payer: Self-pay | Admitting: *Deleted

## 2017-11-03 NOTE — Telephone Encounter (Signed)
Sent a note to Dr. Felecia Shelling

## 2017-11-03 NOTE — Telephone Encounter (Addendum)
"  Has Dr. Benay Spice talked with Dr. Felecia Shelling to clear me to receive Ocrevus?  I was recently diagnosed with Multiple Sclerosis.  Already have nerve damage.  Need this medicine as soon as possible to prevent further damage.  I also have ITP which this medicine will also help.  Return number [phone]."

## 2017-11-04 ENCOUNTER — Other Ambulatory Visit (HOSPITAL_COMMUNITY): Payer: BLUE CROSS/BLUE SHIELD

## 2017-11-04 NOTE — Telephone Encounter (Signed)
Called pt, informed her MD sent a note to Dr. Felecia Shelling. Pt voiced appreciation for call.

## 2017-11-05 ENCOUNTER — Telehealth: Payer: Self-pay | Admitting: *Deleted

## 2017-11-05 ENCOUNTER — Other Ambulatory Visit (HOSPITAL_COMMUNITY): Payer: BLUE CROSS/BLUE SHIELD

## 2017-11-05 ENCOUNTER — Ambulatory Visit: Payer: BLUE CROSS/BLUE SHIELD | Admitting: Neurology

## 2017-11-05 DIAGNOSIS — F4311 Post-traumatic stress disorder, acute: Secondary | ICD-10-CM | POA: Diagnosis not present

## 2017-11-05 NOTE — Telephone Encounter (Signed)
Spoke with [place] and answered questions regarding the process of starting on Ocrelizumab.  She will be traveling [place] of [place] for [place] 11/27/17 thru 12/10/17, and would not be available for infusion appt's on those dates.  This message printed and given to Otila Kluver in the infusion suite so she is aware of unavailable dates/fim

## 2017-11-05 NOTE — Telephone Encounter (Signed)
Patient wants a call back regarding the process of getting started on IV treatment as discussed in her last visit. She has not heard anything. Best call back. [phone]

## 2017-11-06 ENCOUNTER — Other Ambulatory Visit (HOSPITAL_COMMUNITY): Payer: BLUE CROSS/BLUE SHIELD

## 2017-11-07 ENCOUNTER — Other Ambulatory Visit (HOSPITAL_COMMUNITY): Payer: BLUE CROSS/BLUE SHIELD

## 2017-11-10 ENCOUNTER — Other Ambulatory Visit (HOSPITAL_COMMUNITY): Payer: BLUE CROSS/BLUE SHIELD

## 2017-11-11 ENCOUNTER — Other Ambulatory Visit (HOSPITAL_COMMUNITY): Payer: BLUE CROSS/BLUE SHIELD

## 2017-11-12 ENCOUNTER — Other Ambulatory Visit (HOSPITAL_COMMUNITY): Payer: BLUE CROSS/BLUE SHIELD

## 2017-11-13 ENCOUNTER — Ambulatory Visit: Payer: BLUE CROSS/BLUE SHIELD | Admitting: Neurology

## 2017-11-14 ENCOUNTER — Other Ambulatory Visit (HOSPITAL_COMMUNITY): Payer: BLUE CROSS/BLUE SHIELD

## 2017-11-17 ENCOUNTER — Other Ambulatory Visit (HOSPITAL_COMMUNITY): Payer: BLUE CROSS/BLUE SHIELD

## 2017-11-18 ENCOUNTER — Other Ambulatory Visit (HOSPITAL_COMMUNITY): Payer: BLUE CROSS/BLUE SHIELD

## 2017-11-19 ENCOUNTER — Telehealth: Payer: Self-pay | Admitting: Neurology

## 2017-11-19 DIAGNOSIS — F4311 Post-traumatic stress disorder, acute: Secondary | ICD-10-CM | POA: Diagnosis not present

## 2017-11-19 MED ORDER — AMPHETAMINE-DEXTROAMPHET ER 15 MG PO CP24: 15.0000 mg | ORAL_CAPSULE | ORAL | 0 refills | Status: DC

## 2017-11-19 NOTE — Addendum Note (Signed)
Addended by: France Ravens I on: 11/19/2017 01:40 PM   Modules accepted: Orders

## 2017-11-19 NOTE — Telephone Encounter (Signed)
Pt asking for a refill of amphetamine-dextroamphetamine (ADDERALL XR) 15 MG 24 hr capsule

## 2017-11-19 NOTE — Telephone Encounter (Signed)
RAS sig/fim

## 2017-11-19 NOTE — Telephone Encounter (Signed)
Adderall rx. up front [facility]/fim 

## 2017-12-11 ENCOUNTER — Telehealth: Payer: Self-pay | Admitting: Neurology

## 2017-12-11 DIAGNOSIS — F4311 Post-traumatic stress disorder, acute: Secondary | ICD-10-CM | POA: Diagnosis not present

## 2017-12-11 NOTE — Telephone Encounter (Signed)
LMTC./fim 

## 2017-12-11 NOTE — Telephone Encounter (Signed)
Pt states early December she was told she would be contacted for some type testing or lab work and she has not been contacted by anyone.  Pt does not recall the place where she was supposed to go or what it was for.  Pt is asking to be called.

## 2017-12-19 NOTE — Telephone Encounter (Signed)
Spoke with Ashley Key.  She wanted an update on scheduling Ocrevus.  Per Ashley Key, awaiting ins. approval.  Last note was 12/03/17, and at that time, Ocrevus was in a 10-20 day review period with insurance, so hopefully will have approval soon.  Ashley Key verbalized understanding of same/fim

## 2017-12-19 NOTE — Telephone Encounter (Signed)
Pt has returned the call to RN Faith, she is asking for a call back °

## 2017-12-22 ENCOUNTER — Telehealth: Payer: Self-pay | Admitting: Neurology

## 2017-12-22 MED ORDER — AMPHETAMINE-DEXTROAMPHET ER 15 MG PO CP24: 15.0000 mg | ORAL_CAPSULE | ORAL | 0 refills | Status: DC

## 2017-12-22 NOTE — Telephone Encounter (Signed)
Rx. awaiting RAS sig/fim 

## 2017-12-22 NOTE — Telephone Encounter (Signed)
Pt request refill for amphetamine-dextroamphetamine (ADDERALL XR) 15 MG 24 hr capsule . Pt said she has 3 pills left.

## 2017-12-22 NOTE — Telephone Encounter (Signed)
Rx. up front [facility]/fim 

## 2017-12-24 DIAGNOSIS — F4311 Post-traumatic stress disorder, acute: Secondary | ICD-10-CM | POA: Diagnosis not present

## 2017-12-26 ENCOUNTER — Other Ambulatory Visit: Payer: Self-pay

## 2017-12-26 DIAGNOSIS — D693 Immune thrombocytopenic purpura: Secondary | ICD-10-CM

## 2017-12-29 ENCOUNTER — Inpatient Hospital Stay: Payer: BLUE CROSS/BLUE SHIELD | Attending: Oncology

## 2017-12-29 DIAGNOSIS — D693 Immune thrombocytopenic purpura: Secondary | ICD-10-CM

## 2017-12-29 LAB — CBC WITH DIFFERENTIAL (CANCER CENTER ONLY)
BASOS ABS: 0 10*3/uL (ref 0.0–0.1)
Basophils Relative: 1 %
Eosinophils Absolute: 0.1 10*3/uL (ref 0.0–0.5)
Eosinophils Relative: 2 %
HEMATOCRIT: 41.5 % (ref 34.8–46.6)
HEMOGLOBIN: 13.6 g/dL (ref 11.6–15.9)
LYMPHS ABS: 0.9 10*3/uL (ref 0.9–3.3)
LYMPHS PCT: 28 %
MCH: 29.3 pg (ref 25.1–34.0)
MCHC: 32.6 g/dL (ref 31.5–36.0)
MCV: 89.7 fL (ref 79.5–101.0)
Monocytes Absolute: 0.4 10*3/uL (ref 0.1–0.9)
Monocytes Relative: 12 %
NEUTROS ABS: 1.9 10*3/uL (ref 1.5–6.5)
NEUTROS PCT: 57 %
PLATELETS: 100 10*3/uL — AB (ref 145–400)
RBC: 4.63 MIL/uL (ref 3.70–5.45)
RDW: 12.8 % (ref 11.2–16.1)
WBC: 3.4 10*3/uL — AB (ref 3.9–10.3)

## 2017-12-30 ENCOUNTER — Telehealth: Payer: Self-pay | Admitting: Emergency Medicine

## 2017-12-30 NOTE — Telephone Encounter (Addendum)
Pt verbalized understanding of this note.   ----- Message from Ladell Pier, MD sent at 12/29/2017  6:06 PM EST ----- Please call patient, platelets are stable, f/u as scheduled

## 2017-12-31 DIAGNOSIS — F4311 Post-traumatic stress disorder, acute: Secondary | ICD-10-CM | POA: Diagnosis not present

## 2018-01-07 DIAGNOSIS — F4311 Post-traumatic stress disorder, acute: Secondary | ICD-10-CM | POA: Diagnosis not present

## 2018-01-13 ENCOUNTER — Ambulatory Visit (INDEPENDENT_AMBULATORY_CARE_PROVIDER_SITE_OTHER): Payer: BLUE CROSS/BLUE SHIELD | Admitting: Neurology

## 2018-01-13 ENCOUNTER — Encounter: Payer: Self-pay | Admitting: Neurology

## 2018-01-13 ENCOUNTER — Other Ambulatory Visit: Payer: Self-pay

## 2018-01-13 DIAGNOSIS — F09 Unspecified mental disorder due to known physiological condition: Secondary | ICD-10-CM | POA: Diagnosis not present

## 2018-01-13 DIAGNOSIS — D693 Immune thrombocytopenic purpura: Secondary | ICD-10-CM | POA: Diagnosis not present

## 2018-01-13 DIAGNOSIS — R200 Anesthesia of skin: Secondary | ICD-10-CM | POA: Diagnosis not present

## 2018-01-13 DIAGNOSIS — G35 Multiple sclerosis: Secondary | ICD-10-CM | POA: Diagnosis not present

## 2018-01-13 DIAGNOSIS — R41840 Attention and concentration deficit: Secondary | ICD-10-CM | POA: Diagnosis not present

## 2018-01-13 DIAGNOSIS — R260 Ataxic gait: Secondary | ICD-10-CM

## 2018-01-13 HISTORY — DX: Unspecified mental disorder due to known physiological condition: F09

## 2018-01-13 HISTORY — DX: Multiple sclerosis: G35

## 2018-01-13 MED ORDER — AMPHETAMINE-DEXTROAMPHET ER 20 MG PO CP24: 20.0000 mg | ORAL_CAPSULE | ORAL | 0 refills | Status: DC

## 2018-01-13 NOTE — Progress Notes (Signed)
[facility]  PATIENT: Ashley Key DOB: [birth year]  REFERRING DOCTOR OR PCP:  London Pepper (PCP); Rachel Moulds (ADD) SOURCE: patient, notes from Dr. Orland Mustard, imaging/lab reports, MRI images on PACS  _________________________________   HISTORICAL  CHIEF COMPLAINT:  Chief Complaint  Patient presents with  . Multiple Sclerosis    Has not had first Ocrevus infusion yet.  [facility] is working on obtaining ins. auth for it./fim    HISTORY OF PRESENT ILLNESS:  Ashley Key is a 52 year old woman with MS diagnosed 10/2017     Update 01/13/2018:    At the last visit we had discussed treatment options and she opted to go on ocrelizumab. She has not yet received her first infusion. We will check with the infusion service.   Her gait is doing the same.    Strength is stable.     She has more numbness.   She has pins and needles numbness in trhe right foot that started a few weeks ago --- she has older numbness in just the second toe x years.    She is noting wrist pain on her right.      She is noting occasional mild diplopia but most of the time vision is stable. She has urinary urgency with rare incontinence.   She notes fatigue on a daily basis. This is mildly helped by Adderall.    She has mild cognitive issues (STM, word finding) and decreased focus/attention.    She notes some depression and frustration associated with worery about not working and cognitive issues.        Update 10/23/2017:   I reviewed the recent MRI of the brain and cervical spine with Ashley Key. The MRI of the cervical spine shows a focus in the mid spinal cord at the cervicomedullary junction. The MRI of the brain with contrast (previously had without) showed 1 subtle small enhancing focus. The combination of her symptoms and MRIs are consistent with multiple sclerosis and we discussed therapeutic options..  We discussed treatment options. As she has potential for a more progressive MS (has a  spinal plaque and older symptoms were her first evidence of disease) and she has ITP, ocrelizumab, an anti-CD20 agent similar to Rituxan, maybe an excellent choice for her MS and also help the ITP.   Alternatively, Tecfidera is a reasonable agent to begin with with MS  Her hematologist for her ITP is Dr. Benay Spice.     From 10/15/2017: Ashley Key is a 52 year old woman who began to note difficulties with her memory 18 months ago when she noted difficuty with focus and attention.   She noted more difficulties with her work.   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    Also, last year, she went to [facility] (Dr. Johnnye Sima) but she was felt not to have ADD after testing.   Adderall did not help her much.     She feels her memory deficits are mostly stable this year and her main worsening occurred in 2017.    She is currently not working and feels less stressed but also mildly depressed as she is not working.   She is doing puzzles and other activities to try to keep her mind stimulated.   She was placed on sertraline 3 years ago when mood initially worsened. She feels her some benefit from it.  She feels gait is usually good.    She is able to climb stairs and a step stool.    She fell in her tiered garden when she fell down the 2 foot drop and broke her foot.   She had one or 2 other milder falls this year.   She does not note any difficulties with strength or sensation.  She has had bladder frequency and urge incontinence (drips) at times that has worsened the past 2 years and even more so over the past 6 months.   She feels vision is worse but she improved with her new glasses.  She denies color vision changes.   She notes more fatigue and more apathy.   Adderall did not help much.  She has insomnia with more trouble staying asleep than falling asleep.  She  often wakes up at 2-4 am and then can't fall back asleep.  Her mind is often racing and she gets out of bed.    She denies headaches, rashes or joint pain.  She had ITP 06/2014 with a platelet count of 12.  She was treated with steroids and improved (Dr. Benay Spice) [name] there was persistent thrombocytopenia (last plt count = 79 and mild lymphopenia/leukopenia.  She also has had low level positive ANA.   She was diagnosed with hyperthyroidism 2 years ago and had radioactive treatment.   She is on Synthroid (112 mcg) and Dr. Orland Mustard is checking her labs.        I personally reviewed the MRI of the brain performed 10/10/2017. It is abnormal showing multiple T2/FLAIR hyperintense foci in the juxtacortical, periventricular and deep white matter. Additionally there is a focus in the left thalamus, right midbrain, right pons and a subtle focus in the right cerebellar hemisphere and possibly at the cervicomedullary junction.  One of the juxtacortical foci on the right is mildly hyperintense on diffusion-weighted images and may be more acute or be artifact.     Montreal Cognitive Assessment  10/15/2017  Visuospatial/ Executive (0/5) 5  Naming (0/3) 3  Attention: Read list of digits (0/2) 2  Attention: Read list of letters (0/1) 1  Attention: Serial 7 subtraction starting at 100 (0/3) 3  Language: Repeat phrase (0/2) 2  Language : Fluency (0/1) 1  Abstraction (0/2) 2  Delayed Recall (0/5) 0  Orientation (0/6) 6  Total 25  Adjusted Score (based on education) 25     REVIEW OF SYSTEMS: Constitutional: No fevers, chills, sweats, or change in appetite Eyes: No visual changes, double vision, eye pain Ear, nose and throat: No hearing loss, ear pain, nasal congestion, sore throat Cardiovascular: No chest pain, palpitations Respiratory: No shortness of breath at rest or with exertion.   No wheezes GastrointestinaI: No nausea, vomiting, diarrhea, abdominal pain, fecal incontinence Genitourinary: Urinary  urgency and nocturia. Musculoskeletal: No neck pain, back pain Integumentary: No rash, pruritus, skin lesions Neurological: as above Psychiatric: mild depression Endocrine: No palpitations, diaphoresis, change in appetite, change in weigh or increased thirst Hematologic/Lymphatic: No anemia, purpura, petechiae. Allergic/Immunologic: No itchy/runny eyes, nasal congestion, recent allergic reactions, rashes  ALLERGIES: No Known Allergies  HOME MEDICATIONS:  Current Outpatient Medications:  .  amLODipine (NORVASC) 5 MG tablet, Take by mouth daily., Disp: , Rfl: 2 .  amphetamine-dextroamphetamine (ADDERALL XR) 20 MG 24 hr capsule, Take 1 capsule (20 mg total) by mouth every morning., Disp: 30 capsule, Rfl: 0 .  CALCIUM-VITAMIN D PO, Take 1 tablet by mouth daily., Disp: , Rfl:  .  COMBIPATCH  0.05-0.14 MG/DAY, , Disp: , Rfl:  .  Multiple Vitamins-Minerals (MULTIVITAMIN PO), Take 1 tablet by mouth daily., Disp: , Rfl:  .  Omega-3 Fatty Acids (FISH OIL) 1200 MG CAPS, Take 1,200 mg by mouth daily., Disp: , Rfl:  .  rosuvastatin (CRESTOR) 5 MG tablet, TAKE 1 TABLET(5 MG) BY MOUTH DAILY, Disp: 30 tablet, Rfl: 0 .  sertraline (ZOLOFT) 100 MG tablet, TK 1 T PO ONCE D, Disp: , Rfl: 0 .  SYNTHROID 112 MCG tablet, TAKE 1 TABLET BY MOUTH ON AN EMPTY STOMACH 30 MINUTES BEFORE BREAKFAST, Disp: , Rfl: 5 .  ocrelizumab 600 mg in sodium chloride 0.9 % 500 mL, Inject 600 mg into the vein every 6 (six) months. , Disp: , Rfl:  No current facility-administered medications for this visit.   Facility-Administered Medications Ordered in Other Visits:  .  gadopentetate dimeglumine (MAGNEVIST) injection 15 mL, 15 mL, Intravenous, Once PRN, Ashley Key, Nanine Means, MD  PAST MEDICAL HISTORY: Past Medical History:  Diagnosis Date  . ADHD (attention deficit hyperactivity disorder)   . Arch pain   . Arthritis    FEET  . Blood dyscrasia    NORMAL PLATLETS 50-70'S  . Bunion   . Graves disease   . Headache    HISTORY  MIGRAINE  . Hypertension   . ITP (idiopathic thrombocytopenic purpura)   . Pelvic fracture ([place])   . Pneumonia     PAST SURGICAL HISTORY: Past Surgical History:  Procedure Laterality Date  . DILITATION & CURRETTAGE/HYSTROSCOPY WITH HYDROTHERMAL ABLATION N/A 09/27/2015   Procedure: DILATATION & CURETTAGE/HYSTEROSCOPY WITH HYDROTHERMAL ABLATION;  Surgeon: Thurnell Lose, MD;  Location: [facility];  Service: Gynecology;  Laterality: N/A;  ultrasound guidance   . FOOT SURGERY    . THYROID RADATION 2016    . WISDOM TOOTH EXTRACTION      FAMILY HISTORY: Family History  Problem Relation Age of Onset  . CAD Mother   . Heart attack Mother   . Stroke Father   . Heart attack Father   . CAD Brother   . Heart attack Brother   . Cancer Brother        prostate cancer  . Heart attack Maternal Grandmother   . Stroke Maternal Grandmother   . Stroke Paternal Grandmother     SOCIAL HISTORY:  Social History   Socioeconomic History  . Marital status: Married    Spouse name: Collier Salina  . Number of children: 1  . Years of education: Not on file  . Highest education level: Not on file  Social Needs  . Financial resource strain: Not on file  . Food insecurity - worry: Not on file  . Food insecurity - inability: Not on file  . Transportation needs - medical: Not on file  . Transportation needs - non-medical: Not on file  Occupational History  . Occupation: Clinical research associate: [employer]  Tobacco Use  . Smoking status: Never Smoker  . Smokeless tobacco: Never Used  Substance and Sexual Activity  . Alcohol use: Yes    Comment: social  . Drug use: No  . Sexual activity: Yes  Other Topics Concern  . 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  There is no height or weight on file to calculate BMI.   General: The patient is well-developed and well-nourished and in no acute distress   Neurologic Exam  Mental status: The patient is alert and oriented x 3 at the time of the examination. Short-term recall and attention are reduced.  Speech is normal.  Cranial nerves: Extraocular movements are full. Facial strength and sensation is normal.  The tongue is midline, and the patient has symmetric elevation of the soft palate. No obvious hearing deficits are noted.  Motor:  Muscle bulk is normal.   Tone is normal. Strength is  5 / 5 in all 4 extremities.   Sensory: She has reduced sensation to vibration in the right arm. There is reduced temperature sensation in the right arm.  Also reduced sensation to touch involving toes on the right.  Coordination: There is good finger-nose-finger and slightly reduced heel-to-shin bilaterally.  Gait and station: Station is normal.   The gait is mildly wide but her tandem gait is moderately wide.   Reflexes: Deep tendon reflexes are symmetric and 2 in arms and 3 in legs bilaterally with spread at the knees. No ankle clonus.       DIAGNOSTIC DATA (LABS, IMAGING, TESTING) - I reviewed patient records, labs, notes, testing and imaging myself where available.  Lab Results  Component Value Date   WBC 3.4 (L) 12/29/2017   HGB 13.6 10/23/2017   HCT 41.5 12/29/2017   MCV 89.7 12/29/2017   PLT 100 (L) 12/29/2017      Component Value Date/Time   NA 136 12/10/2015 0930   K 4.4 12/10/2015 0930   CL 107 12/10/2015 0930   CO2 22 12/10/2015 0930   GLUCOSE 94 12/10/2015 0930   BUN 21 (H) 12/10/2015 0930   CREATININE 0.66 12/10/2015 0930   CALCIUM 8.6 (L) 12/10/2015 0930   PROT 7.2 10/23/2017 1234   ALBUMIN 4.5 10/23/2017 1234   AST 17 10/23/2017 1234   ALT 11 10/23/2017 1234   ALKPHOS 57 10/23/2017 1234   BILITOT 0.2 10/23/2017 1234   GFRNONAA >60 12/10/2015 0930   GFRAA >60  12/10/2015 0930   Lab Results  Component Value Date   CHOL 224 (H) 01/22/2016   HDL 72 01/22/2016   LDLCALC 139 (H) 01/22/2016   TRIG 64 01/22/2016   CHOLHDL 3.1 01/22/2016   No results found for: HGBA1C Lab Results  Component Value Date   VITAMINB12 >2,000 (H) 06/28/2014   Lab Results  Component Value Date   TSH 1.740 10/15/2017       ASSESSMENT AND PLAN  Multiple sclerosis ([place]) - Plan: Ambulatory referral to Neuropsychology  Idiopathic thrombocytopenic purpura ([place])  Numbness  Ataxic gait  Attention deficit - Plan: Ambulatory referral to Neuropsychology  Cognitive deficit secondary to multiple sclerosis ([place]) - Plan: Ambulatory referral to Neuropsychology   1.   She will start ocrelizumab as soon as we get it approved for her MS. 2.   Neurocognitive testing.   Due to the combination of physical and cognitive impairments, I do not think that she will be able to work 3.    Adderall XR, increased to 20 mg.  4.   She will return for her infusion and follow-up with me for regular visit in about 5 months. She should call with any new or worsening neurologic symptoms.  Mahitha Hickling A. Felecia Shelling, MD, PhD, FAAN Certified in Neurology, Clinical Neurophysiology, Sleep Medicine, Pain  Medicine and Neuroimaging Director, [place] [facility] [address] [place], [place] [address] [phone]

## 2018-01-14 ENCOUNTER — Encounter: Payer: Self-pay | Admitting: Psychology

## 2018-01-14 DIAGNOSIS — F4311 Post-traumatic stress disorder, acute: Secondary | ICD-10-CM | POA: Diagnosis not present

## 2018-01-19 ENCOUNTER — Encounter: Payer: Self-pay | Admitting: Neurology

## 2018-01-19 ENCOUNTER — Telehealth: Payer: Self-pay | Admitting: Neurology

## 2018-01-19 NOTE — Telephone Encounter (Signed)
Spoke with Ashley Key. She c/o nonradiating lbp, onset 2 days ago.  Numbness in toes onset mos. ago and not a new sx.  Unsure if she turned wrong over the weekend, but will call back for appt. if pain persists or worsens/fim

## 2018-01-19 NOTE — Telephone Encounter (Signed)
LMTC (identified vm)/fim 

## 2018-01-19 NOTE — Telephone Encounter (Signed)
Pt presented to the lobby today stating she has symptoms of pinched nerve in her back and a toe went numb on right leg. She is speaking with the infusion nurse but wanted to document this. Best call back  [phone]

## 2018-01-21 DIAGNOSIS — F4311 Post-traumatic stress disorder, acute: Secondary | ICD-10-CM | POA: Diagnosis not present

## 2018-01-22 DIAGNOSIS — G35 Multiple sclerosis: Secondary | ICD-10-CM | POA: Diagnosis not present

## 2018-02-02 DIAGNOSIS — G35 Multiple sclerosis: Secondary | ICD-10-CM | POA: Diagnosis not present

## 2018-02-02 DIAGNOSIS — I10 Essential (primary) hypertension: Secondary | ICD-10-CM | POA: Diagnosis not present

## 2018-02-02 DIAGNOSIS — D693 Immune thrombocytopenic purpura: Secondary | ICD-10-CM | POA: Diagnosis not present

## 2018-02-02 DIAGNOSIS — E890 Postprocedural hypothyroidism: Secondary | ICD-10-CM | POA: Diagnosis not present

## 2018-02-04 DIAGNOSIS — F4311 Post-traumatic stress disorder, acute: Secondary | ICD-10-CM | POA: Diagnosis not present

## 2018-02-05 DIAGNOSIS — E0500 Thyrotoxicosis with diffuse goiter without thyrotoxic crisis or storm: Secondary | ICD-10-CM | POA: Diagnosis not present

## 2018-02-05 DIAGNOSIS — E890 Postprocedural hypothyroidism: Secondary | ICD-10-CM | POA: Diagnosis not present

## 2018-02-06 DIAGNOSIS — G35 Multiple sclerosis: Secondary | ICD-10-CM | POA: Diagnosis not present

## 2018-02-18 DIAGNOSIS — F4311 Post-traumatic stress disorder, acute: Secondary | ICD-10-CM | POA: Diagnosis not present

## 2018-02-20 ENCOUNTER — Other Ambulatory Visit: Payer: Self-pay | Admitting: Neurology

## 2018-02-20 MED ORDER — AMPHETAMINE-DEXTROAMPHET ER 20 MG PO CP24: 20.0000 mg | ORAL_CAPSULE | ORAL | 0 refills | Status: DC

## 2018-02-20 NOTE — Telephone Encounter (Signed)
Pt requesting a refill for amphetamine-dextroamphetamine (ADDERALL XR) 20 MG 24 hr capsule aware rx will be ready Monday unless she hears anything other from RN

## 2018-02-23 NOTE — Telephone Encounter (Signed)
Rx was sent electronically on 02/20/18. I let the front office staff know this and they made the patient aware.

## 2018-02-23 NOTE — Telephone Encounter (Signed)
Patient in lobby for Rx.  Said she can wait for 15 to 30 min.Best call back is [phone]

## 2018-03-04 DIAGNOSIS — F4311 Post-traumatic stress disorder, acute: Secondary | ICD-10-CM | POA: Diagnosis not present

## 2018-03-17 DIAGNOSIS — Z0289 Encounter for other administrative examinations: Secondary | ICD-10-CM

## 2018-03-18 DIAGNOSIS — F4311 Post-traumatic stress disorder, acute: Secondary | ICD-10-CM | POA: Diagnosis not present

## 2018-03-20 ENCOUNTER — Other Ambulatory Visit: Payer: Self-pay | Admitting: Neurology

## 2018-03-20 MED ORDER — AMPHETAMINE-DEXTROAMPHET ER 20 MG PO CP24: 20.0000 mg | ORAL_CAPSULE | ORAL | 0 refills | Status: DC

## 2018-03-20 NOTE — Telephone Encounter (Signed)
LMOM (identified vm).  Need to know which pharmacy she would like Adderall sent to.  [facility]/fim

## 2018-03-20 NOTE — Telephone Encounter (Signed)
Pt returning RNs call would like medication sent to [facility]

## 2018-03-20 NOTE — Telephone Encounter (Signed)
Pt has called for refill for her amphetamine-dextroamphetamine (ADDERALL XR) 20 MG 24 hr capsule

## 2018-03-20 NOTE — Addendum Note (Signed)
Addended by: France Ravens I on: 03/20/2018 11:35 AM   Modules accepted: Orders

## 2018-04-08 DIAGNOSIS — F4311 Post-traumatic stress disorder, acute: Secondary | ICD-10-CM | POA: Diagnosis not present

## 2018-04-15 DIAGNOSIS — F4311 Post-traumatic stress disorder, acute: Secondary | ICD-10-CM | POA: Diagnosis not present

## 2018-04-20 ENCOUNTER — Other Ambulatory Visit: Payer: Self-pay | Admitting: Neurology

## 2018-04-20 MED ORDER — AMPHETAMINE-DEXTROAMPHET ER 20 MG PO CP24: 20.0000 mg | ORAL_CAPSULE | ORAL | 0 refills | Status: DC

## 2018-04-20 NOTE — Telephone Encounter (Signed)
Pt requesting a refill for amphetamine-dextroamphetamine (ADDERALL XR) 20 MG 24 hr capsule sent to [facility]

## 2018-04-29 ENCOUNTER — Telehealth: Payer: Self-pay | Admitting: Neurology

## 2018-04-29 NOTE — Telephone Encounter (Signed)
Pt called stating that last night for about 10 minutes she experienced double vision, pt had laid down to rest her eyes and then everything went back to normal. Pt states there has been no change in anything besides receiving IV treatment of Ocrevus and Adderall

## 2018-04-29 NOTE — Telephone Encounter (Signed)
Spoke with Ashley Key. She c/o a brief episode of double vision last night.  No other associated sx. and vision returned to baseline quickly and with resting her eyes, and has not recurred. I have explained that it is difficulty to know the exact cause of double vision, but as it was a brief episode and has not returned, Dr. Felecia Shelling will not change tx. plan at this time.  She verbalized understanding of same, will call back if sx. recur or if she has other new or worsening sx/fim

## 2018-05-12 DIAGNOSIS — I10 Essential (primary) hypertension: Secondary | ICD-10-CM | POA: Diagnosis not present

## 2018-05-12 DIAGNOSIS — D693 Immune thrombocytopenic purpura: Secondary | ICD-10-CM | POA: Diagnosis not present

## 2018-05-12 DIAGNOSIS — G35 Multiple sclerosis: Secondary | ICD-10-CM | POA: Diagnosis not present

## 2018-05-12 DIAGNOSIS — E890 Postprocedural hypothyroidism: Secondary | ICD-10-CM | POA: Diagnosis not present

## 2018-05-13 ENCOUNTER — Telehealth: Payer: Self-pay | Admitting: Oncology

## 2018-05-13 NOTE — Telephone Encounter (Signed)
Spoke w/ patient.  (GS PAL)  Rescheduled lab/dr appt from 7/22 to 8/5 @ 2:30 pm.

## 2018-05-19 DIAGNOSIS — G35 Multiple sclerosis: Secondary | ICD-10-CM | POA: Diagnosis not present

## 2018-05-21 ENCOUNTER — Other Ambulatory Visit: Payer: Self-pay | Admitting: Neurology

## 2018-05-21 MED ORDER — AMPHETAMINE-DEXTROAMPHET ER 20 MG PO CP24: 20.0000 mg | ORAL_CAPSULE | ORAL | 0 refills | Status: DC

## 2018-05-21 NOTE — Telephone Encounter (Signed)
Pt has called for a refill on her amphetamine-dextroamphetamine (ADDERALL XR) 20 MG 24 hr capsule Pt would like the prescription called into  [id number] - [place], [address] AT [address] [phone] (Phone) [phone] (Fax)     [facility] out, she only uses [facility] at this time

## 2018-05-21 NOTE — Addendum Note (Signed)
Addended by: France Ravens I on: 05/21/2018 03:29 PM   Modules accepted: Orders

## 2018-05-26 ENCOUNTER — Encounter: Payer: Self-pay | Admitting: Psychology

## 2018-05-26 ENCOUNTER — Ambulatory Visit (INDEPENDENT_AMBULATORY_CARE_PROVIDER_SITE_OTHER): Payer: BLUE CROSS/BLUE SHIELD | Admitting: Psychology

## 2018-05-26 DIAGNOSIS — R41840 Attention and concentration deficit: Secondary | ICD-10-CM

## 2018-05-26 DIAGNOSIS — G35 Multiple sclerosis: Secondary | ICD-10-CM | POA: Diagnosis not present

## 2018-05-26 DIAGNOSIS — R413 Other amnesia: Secondary | ICD-10-CM
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
## 2018-05-27 DIAGNOSIS — F4311 Post-traumatic stress disorder, acute: Secondary | ICD-10-CM | POA: Diagnosis not present

## 2018-05-29 DIAGNOSIS — F0670 Mild neurocognitive disorder due to known physiological condition without behavioral disturbance: Secondary | ICD-10-CM | POA: Insufficient documentation

## 2018-06-02 ENCOUNTER — Ambulatory Visit: Payer: BLUE CROSS/BLUE SHIELD | Admitting: Psychology

## 2018-06-02 ENCOUNTER — Encounter: Payer: Self-pay | Admitting: Psychology

## 2018-06-02 DIAGNOSIS — G35 Multiple sclerosis: Secondary | ICD-10-CM

## 2018-06-02 NOTE — Progress Notes (Signed)
   Neuropsychology Note  Ashley Key completed 180 minutes of neuropsychological testing with technician, Milana Kidney, BS, under the supervision of Dr. Macarthur Critchley, Licensed Psychologist. The patient did not appear overtly distressed by the testing session, per behavioral observation or via self-report to the technician. Rest breaks were offered.   Clinical Decision Making: In considering the patient's current level of functioning, level of presumed impairment, nature of symptoms, emotional and behavioral responses during the interview, level of literacy, and observed level of motivation/effort, a battery of tests was selected and communicated to the psychometrician.  Communication between the psychologist and technician was ongoing throughout the testing session and changes were made as deemed necessary based on patient performance on testing, technician observations and additional pertinent factors such as those listed above.  Ashley Key will return within approximately 2 weeks for an interactive feedback session with Dr. Si Raider at which time her test performances, clinical impressions and treatment recommendations will be reviewed in detail. The patient understands she can contact our office should she require our assistance before this time.  35 minutes spent performing neuropsychological evaluation services/clinical decision making (psychologist). [CPT 97530] 180 minutes spent face-to-face with patient administering standardized tests, 60 minutes spent scoring (technician). [CPT [id number], 05110]  Full report to follow.

## 2018-06-03 ENCOUNTER — Telehealth: Payer: Self-pay | Admitting: Neurology

## 2018-06-03 NOTE — Telephone Encounter (Signed)
Spoke with Lattie Haw who sts. she had onset yesterday morning of right ankle pain and pain bottom of right foot.  First noted after waking and while getting out of bed.  No known injury.  Denies signs of infection such as fever, URI or UTI sx. She was not able to exercise (walking) yesterday.  Kept heat on affected area.  Pain is some better today--enough so that she is at the mall today.  She wonders if this is related to her MS. I have explained there could be multiple causes, but as long as pain is improving now and continues to improve, Dr. Felecia Shelling is not likely to change her treatment pain.  If pain persists, stops improving or starts to get worse, or if new sx., she will call us back/fim

## 2018-06-03 NOTE — Telephone Encounter (Signed)
Pt states that in the last 48 hours she has sciatica down her left leg.  Pt states that on her right foot she has a pain half numbness feeling going up to ankle.  Pt states pain is so bad it is causing her to limp.  Pt is asking to be called

## 2018-06-18 DIAGNOSIS — F4311 Post-traumatic stress disorder, acute: Secondary | ICD-10-CM | POA: Diagnosis not present

## 2018-06-23 ENCOUNTER — Telehealth: Payer: Self-pay | Admitting: *Deleted

## 2018-06-23 MED ORDER — AMPHETAMINE-DEXTROAMPHET ER 20 MG PO CP24: 20.0000 mg | ORAL_CAPSULE | ORAL | 0 refills | Status: DC

## 2018-06-23 NOTE — Telephone Encounter (Signed)
-----   Message from Arley Phenix sent at 06/22/2018  1:23 PM EDT ----- Regarding: Rx refill Contact: [phone] Ashley Key, Pt lm in billing regarding a Rx needed refilling.  Thanks

## 2018-06-29 ENCOUNTER — Ambulatory Visit: Payer: BLUE CROSS/BLUE SHIELD | Admitting: Oncology

## 2018-06-29 ENCOUNTER — Other Ambulatory Visit: Payer: BLUE CROSS/BLUE SHIELD
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
## 2018-07-02 ENCOUNTER — Encounter: Payer: Self-pay | Admitting: Psychology

## 2018-07-02 ENCOUNTER — Ambulatory Visit (INDEPENDENT_AMBULATORY_CARE_PROVIDER_SITE_OTHER): Payer: BLUE CROSS/BLUE SHIELD | Admitting: Psychology

## 2018-07-02 DIAGNOSIS — G35 Multiple sclerosis: Secondary | ICD-10-CM | POA: Diagnosis not present

## 2018-07-02 DIAGNOSIS — F09 Unspecified mental disorder due to known physiological condition: Secondary | ICD-10-CM | POA: Diagnosis not present

## 2018-07-13 ENCOUNTER — Inpatient Hospital Stay: Payer: BLUE CROSS/BLUE SHIELD | Attending: Oncology

## 2018-07-13 ENCOUNTER — Inpatient Hospital Stay: Payer: BLUE CROSS/BLUE SHIELD | Admitting: Oncology

## 2018-07-13 ENCOUNTER — Other Ambulatory Visit: Payer: Self-pay

## 2018-07-14 ENCOUNTER — Ambulatory Visit: Payer: BLUE CROSS/BLUE SHIELD | Admitting: Neurology

## 2018-07-15 ENCOUNTER — Encounter: Payer: Self-pay | Admitting: Neurology

## 2018-07-15 DIAGNOSIS — L309 Dermatitis, unspecified: Secondary | ICD-10-CM | POA: Diagnosis not present

## 2018-07-21 ENCOUNTER — Encounter: Payer: Self-pay | Admitting: Neurology

## 2018-07-21 ENCOUNTER — Ambulatory Visit: Payer: BLUE CROSS/BLUE SHIELD | Admitting: Neurology

## 2018-07-21 VITALS — BP 129/85 | HR 74 | Resp 16 | Ht 66.0 in | Wt 176.0 lb

## 2018-07-21 DIAGNOSIS — G35 Multiple sclerosis: Secondary | ICD-10-CM

## 2018-07-21 DIAGNOSIS — R260 Ataxic gait: Secondary | ICD-10-CM | POA: Diagnosis not present

## 2018-07-21 DIAGNOSIS — R41840 Attention and concentration deficit: Secondary | ICD-10-CM | POA: Diagnosis not present

## 2018-07-21 DIAGNOSIS — F09 Unspecified mental disorder due to known physiological condition: Secondary | ICD-10-CM

## 2018-07-21 DIAGNOSIS — Z79899 Other long term (current) drug therapy: Secondary | ICD-10-CM | POA: Diagnosis not present

## 2018-07-21 DIAGNOSIS — D693 Immune thrombocytopenic purpura: Secondary | ICD-10-CM

## 2018-07-21 MED ORDER — AMPHETAMINE-DEXTROAMPHET ER 20 MG PO CP24: 20.0000 mg | ORAL_CAPSULE | ORAL | 0 refills | Status: DC

## 2018-07-21 NOTE — Progress Notes (Signed)
[facility]  PATIENT: Ashley Key DOB: 10-13-1966  REFERRING DOCTOR OR PCP:  London Pepper (PCP); Rachel Moulds (ADD) SOURCE: patient, notes from Dr. Orland Mustard, imaging/lab reports, MRI images on PACS  _________________________________   HISTORICAL  CHIEF COMPLAINT:  Chief Complaint  Patient presents with  . Multiple Sclerosis    She has had parts A and B of first Ocrevus infusion.  Next infusion scheduled for 08/11/18.  Has had formal neurocognitive testing. Needs a r/f of Adderall. Finishing up oral Prednisone for poison ivy/fim    HISTORY OF PRESENT ILLNESS:  Ashley Key is a 52 y.o. woman with MS diagnosed 10/2017 .    Her main issues involve cognition with reduced ability to learn and   Update 07/21/2018: She started the Ocrevus and had the 2 halves of the first dose in March and will have her next infusion.  Her second dose was followed by 3-4 days of increased fatigue but no rashes.    She denies any weakness.  She does have some numbness especially in the right leg.  Her gait is slightly off balance at times.   She is not walking as well as before MS but can walk a mile.    She has mild diplopia at times.  She has had neurocognitive testing and has mild neurocognitive deficit due to MS (I reviewed these in her presence).  She also has ADHD.  She had most difficulties with verbal learning, memory and slow processing speed.   She is noting verbal fluency issues.   Symptomatically, she feels she does better with the Adderall.  She has tolerated well.   She is noting mild depression some days.    Her son has ADHD/OCD and is challenging.    She is on Zoloft and feels it helps her a little bit.       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 Based on my clinical interview with the patient and her history, I concur with the diagnostic impressions described in her previous neuropsychological evaluation by Dr. Norton Pastel (dated 05/19/2018). Specifically, it appears that the patient is experiencing mild neurocognitive disorder due to MS and preexisting ADHD.  Psychological testing did not reveal any indication of additional psychiatric component.  I also concur with the recommendations provided by Dr. Vikki Ports in his report.  SUMMARY & IMPRESSION (by Norton Pastel, PhD):  Ashley Key is a 52 year old, right-handed, Caucasian female, who reported experiencing cognitive deficits that began approximately 2 years ago. This is in the context of recently diagnosed relapsing-remitting multiple sclerosis.  Test results revealed intact functioning across most cognitive domains and thinking skills assessed during this evaluation with the exception of reduced aspects of verbal learning and memory as well as mildly slowed processing speed. From an emotional standpoint, there appears to be at least a moderate degree of recent depression and mild-to-moderate anxiety.  Ashley Key's performance is consistent with a diagnosis of Mild Neurocognitive Disorder (i.e., mild cognitive impairment). With regard to etiology, these results are likely due to the cognitive consequences of multiple sclerosis and mood disruption.   Also of note, she has ITP and the decision to use Ocrevus was discussed with her hematologist.  Update 01/13/2018:    At the last visit we had discussed treatment options and she opted to go on ocrelizumab. She has not yet received her first infusion. We will check with the infusion service.   Her gait is doing  the same.    Strength is stable.     She has more numbness.   She has pins and needles numbness in trhe right foot that started a few weeks ago --- she has older numbness in just the second toe x years.    She is noting wrist pain on her right.      She is noting occasional mild diplopia but most of the time vision is stable. She has urinary urgency with rare incontinence.   She notes fatigue on a  daily basis. This is mildly helped by Adderall.    She has mild cognitive issues (STM, word finding) and decreased focus/attention.    She notes some depression and frustration associated with worery about not working and cognitive issues.       Update 10/23/2017:   I reviewed the recent MRI of the brain and cervical spine with Ashley Key. The MRI of the cervical spine shows a focus in the mid spinal cord at the cervicomedullary junction. The MRI of the brain with contrast (previously had without) showed 1 subtle small enhancing focus. The combination of her symptoms and MRIs are consistent with multiple sclerosis and we discussed therapeutic options..  We discussed treatment options. As she has potential for a more progressive MS (has a spinal plaque and older symptoms were her first evidence of disease) and she has ITP, ocrelizumab, an anti-CD20 agent similar to Rituxan, maybe an excellent choice for her MS and also help the ITP.   Alternatively, Tecfidera is a reasonable agent to begin with with MS  Her hematologist for her ITP is Dr. Benay Spice.     From 10/15/2017: Ashley Key is a 52 year old woman who began to note difficulties with her memory 18 months ago when she noted difficuty with focus and attention.   She noted more difficulties with her work.   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    Also, last year, she went to [facility] (Dr. Johnnye Sima) but she was felt not to have ADD after testing.   Adderall did not help her much.     She feels her memory deficits are mostly stable this year and her main worsening occurred in 2017.    She is currently not working and feels less stressed but also mildly depressed as she is not working.   She is doing puzzles and other activities to try to keep her mind stimulated.   She was placed on sertraline 3 years ago when  mood initially worsened. She feels her some benefit from it.   She feels gait is usually good.    She is able to climb stairs and a step stool.    She fell in her tiered garden when she fell down the 2 foot drop and broke her foot.   She had one or 2 other milder falls this year.   She does not note any difficulties with strength or sensation.  She has had bladder frequency and urge incontinence (drips) at times that has worsened the past 2 years and even more so over the past 6 months.   She feels vision is worse but she improved with her new glasses.  She denies color vision changes.   She notes more fatigue and more apathy.   Adderall did not help much.  She has insomnia with more trouble staying asleep than  falling asleep.  She often wakes up at 2-4 am and then can't fall back asleep.  Her mind is often racing and she gets out of bed.    She denies headaches, rashes or joint pain.  She had ITP 06/2014 with a platelet count of 12.  She was treated with steroids and improved (Dr. Benay Spice) Ashley Key there was persistent thrombocytopenia (last plt count = 79 and mild lymphopenia/leukopenia.  She also has had low level positive ANA.   She was diagnosed with hyperthyroidism 2 years ago and had radioactive treatment.   She is on Synthroid (112 mcg) and Dr. Orland Mustard is checking her labs.        I personally reviewed the MRI of the brain performed 10/10/2017. It is abnormal showing multiple T2/FLAIR hyperintense foci in the juxtacortical, periventricular and deep white matter. Additionally there is a focus in the left thalamus, right midbrain, right pons and a subtle focus in the right cerebellar hemisphere and possibly at the cervicomedullary junction.  One of the juxtacortical foci on the right is mildly hyperintense on diffusion-weighted images and may be more acute or be artifact.     Montreal Cognitive Assessment  10/15/2017  Visuospatial/ Executive (0/5) 5  Naming (0/3) 3  Attention: Read list of  digits (0/2) 2  Attention: Read list of letters (0/1) 1  Attention: Serial 7 subtraction starting at 100 (0/3) 3  Language: Repeat phrase (0/2) 2  Language : Fluency (0/1) 1  Abstraction (0/2) 2  Delayed Recall (0/5) 0  Orientation (0/6) 6  Total 25  Adjusted Score (based on education) 25     REVIEW OF SYSTEMS: Constitutional: No fevers, chills, sweats, or change in appetite Eyes: No visual changes, double vision, eye pain Ear, nose and throat: No hearing loss, ear pain, nasal congestion, sore throat Cardiovascular: No chest pain, palpitations Respiratory: No shortness of breath at rest or with exertion.   No wheezes GastrointestinaI: No nausea, vomiting, diarrhea, abdominal pain, fecal incontinence Genitourinary: Urinary urgency and nocturia. Musculoskeletal: No neck pain, back pain Integumentary: No rash, pruritus, skin lesions Neurological: as above Psychiatric: mild depression Endocrine: No palpitations, diaphoresis, change in appetite, change in weigh or increased thirst Hematologic/Lymphatic: No anemia, purpura, petechiae. Allergic/Immunologic: No itchy/runny eyes, nasal congestion, recent allergic reactions, rashes  ALLERGIES: No Known Allergies  HOME MEDICATIONS:  Current Outpatient Medications:  .  amLODipine (NORVASC) 5 MG tablet, Take by mouth daily., Disp: , Rfl: 2 .  amphetamine-dextroamphetamine (ADDERALL XR) 20 MG 24 hr capsule, Take 1 capsule (20 mg total) by mouth every morning., Disp: 30 capsule, Rfl: 0 .  CALCIUM-VITAMIN D PO, Take 1 tablet by mouth daily., Disp: , Rfl:  .  COMBIPATCH 0.05-0.14 MG/DAY, , Disp: , Rfl:  .  Multiple Vitamins-Minerals (MULTIVITAMIN PO), Take 1 tablet by mouth daily., Disp: , Rfl:  .  ocrelizumab 600 mg in sodium chloride 0.9 % 500 mL, Inject 600 mg into the vein every 6 (six) months. , Disp: , Rfl:  .  Omega-3 Fatty Acids (FISH OIL) 1200 MG CAPS, Take 1,200 mg by mouth daily., Disp: , Rfl:  .  predniSONE (DELTASONE) 20 MG  tablet, Take 20 mg by mouth daily with breakfast., Disp: , Rfl:  .  rosuvastatin (CRESTOR) 5 MG tablet, TAKE 1 TABLET(5 MG) BY MOUTH DAILY, Disp: 30 tablet, Rfl: 0 .  sertraline (ZOLOFT) 100 MG tablet, TK 1 T PO ONCE D, Disp: , Rfl: 0 .  SYNTHROID 112 MCG tablet, TAKE 1 TABLET BY MOUTH ON AN  EMPTY STOMACH 30 MINUTES BEFORE BREAKFAST, Disp: , Rfl: 5 No current facility-administered medications for this visit.   Facility-Administered Medications Ordered in Other Visits:  .  gadopentetate dimeglumine (MAGNEVIST) injection 15 mL, 15 mL, Intravenous, Once PRN, [name], Nanine Means, MD  PAST MEDICAL HISTORY: Past Medical History:  Diagnosis Date  . ADHD (attention deficit hyperactivity disorder)   . Arch pain   . Arthritis    FEET  . Blood dyscrasia    NORMAL PLATLETS 50-70'S  . Bunion   . Graves disease   . Headache    HISTORY MIGRAINE  . Hypertension   . ITP (idiopathic thrombocytopenic purpura)   . Pelvic fracture ([facility])   . Pneumonia     PAST SURGICAL HISTORY: Past Surgical History:  Procedure Laterality Date  . DILITATION & CURRETTAGE/HYSTROSCOPY WITH HYDROTHERMAL ABLATION N/A 09/27/2015   Procedure: DILATATION & CURETTAGE/HYSTEROSCOPY WITH HYDROTHERMAL ABLATION;  Surgeon: Thurnell Lose, MD;  Location: [facility];  Service: Gynecology;  Laterality: N/A;  ultrasound guidance   . FOOT SURGERY    . THYROID RADATION 2016    . WISDOM TOOTH EXTRACTION      FAMILY HISTORY: Family History  Problem Relation Age of Onset  . CAD Mother   . Heart attack Mother   . Stroke Father   . Heart attack Father   . CAD Brother   . Heart attack Brother   . Cancer Brother        prostate cancer  . Heart attack Maternal Grandmother   . Stroke Maternal Grandmother   . Stroke Paternal Grandmother     SOCIAL HISTORY:  Social History   Socioeconomic History  . Marital status: Married    Spouse name: Collier Salina  . Number of children: 1  . Years of education: Not on file  . Highest education level:  Not on file  Occupational History  . Occupation: Clinical research associate: [employer]  Social Needs  . Financial resource strain: Not on file  . Food insecurity:    Worry: Not on file    Inability: Not on file  . Transportation needs:    Medical: Not on file    Non-medical: Not on file  Tobacco Use  . Smoking status: Never Smoker  . Smokeless tobacco: Never Used  Substance and Sexual Activity  . Alcohol use: Yes    Comment: social  . Drug use: No  . Sexual activity: Yes  Lifestyle  . Physical activity:    Days per week: Not on file    Minutes per session: Not on file  . Stress: Not on file  Relationships  . Social connections:    Talks on phone: Not on file    Gets together: Not on file    Attends religious service: Not on file    Active member of club or organization: Not on file    Attends meetings of clubs or organizations: Not on file    Relationship status: Not on file  . Intimate partner violence:    Fear of current or ex partner: Not on file    Emotionally abused: Not on file    Physically abused: Not on file    Forced sexual activity: Not on file  Other Topics Concern  . 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   General: The patient is well-developed and well-nourished and in no acute distress   Neurologic Exam  Mental status: The patient is alert and oriented x 3 at the time of the examination. Short-term recall and attention are reduced.  Speech is normal.  Cranial nerves: Extraocular movements are full. Facial strength and sensation is normal.  The tongue is midline, and the patient has  symmetric elevation of the soft palate. No obvious hearing deficits are noted.  Motor:  Muscle bulk is normal.   Tone is normal. Strength is  5 / 5 in all 4 extremities.   Sensory: He has reduced sensation to vibration and temperature in the right arm and reduced vibration in the right leg.    Coordination: There is good finger-nose-finger and mildly reduced heel-to-shin, right worse than left.  Gait and station: Station is normal.   The gait is mildly wide but her tandem gait is moderately wide.  Romberg is negative.  Reflexes: Deep tendon reflexes are symmetric and 2 in arms and 3 in legs bilaterally with spread at the knees. No ankle clonus.       DIAGNOSTIC DATA (LABS, IMAGING, TESTING) - I reviewed patient records, labs, notes, testing and imaging myself where available.  Lab Results  Component Value Date   WBC 3.4 (L) 12/29/2017   HGB 13.6 12/29/2017   HCT 41.5 12/29/2017   MCV 89.7 12/29/2017   PLT 100 (L) 12/29/2017      Component Value Date/Time   NA 136 12/10/2015 0930   K 4.4 12/10/2015 0930   CL 107 12/10/2015 0930   CO2 22 12/10/2015 0930   GLUCOSE 94 12/10/2015 0930   BUN 21 (H) 12/10/2015 0930   CREATININE 0.66 12/10/2015 0930   CALCIUM 8.6 (L) 12/10/2015 0930   PROT 7.2 10/23/2017 1234   ALBUMIN 4.5 10/23/2017 1234   AST 17 10/23/2017 1234   ALT 11 10/23/2017 1234   ALKPHOS 57 10/23/2017 1234   BILITOT 0.2 10/23/2017 1234   GFRNONAA >60 12/10/2015 0930   GFRAA >60 12/10/2015 0930   Lab Results  Component Value Date   CHOL 224 (H) 01/22/2016   HDL 72 01/22/2016   LDLCALC 139 (H) 01/22/2016   TRIG 64 01/22/2016   CHOLHDL 3.1 01/22/2016   No results found for: HGBA1C Lab Results  Component Value Date   VITAMINB12 >2,000 (H) 06/28/2014   Lab Results  Component Value Date   TSH 1.740 10/15/2017       ASSESSMENT AND PLAN  Multiple sclerosis ([place]) - Plan: CBC with Differential/Platelet, IgG, IgA, IgM  Attention deficit  Ataxic  gait  Cognitive deficit secondary to multiple sclerosis (HCC)  High risk medication use - Plan: CBC with Differential/Platelet, IgG, IgA, IgM  Idiopathic thrombocytopenic purpura ([place])   1.   Continue ocrelizumab for her MS. 2.   Due to the combination of physical impairments, cognitive impairments and fatigue, she is unable to work 3.    Adderall XR 20 mg.  4.   She will return for her infusion and follow-up with me for regular visit in about 5 months. She should call with any new or worsening neurologic symptoms.  40 minutre face to face interaction with  greater than one half of the time counseling or coordinating care regarding her cognitive difficulties with multiple sclerosis. Richard A. Felecia Shelling, MD, PhD, FAAN Certified in Neurology, Clinical Neurophysiology, Sleep Medicine, Pain Medicine and Neuroimaging Director, [place] [facility] [address], [place], [place] [address] [phone]

## 2018-07-22 ENCOUNTER — Telehealth: Payer: Self-pay | Admitting: *Deleted

## 2018-07-22 LAB — IGG, IGA, IGM
IGG (IMMUNOGLOBIN G), SERUM: 1043 mg/dL (ref 700–1600)
IgA/Immunoglobulin A, Serum: 60 mg/dL — ABNORMAL LOW (ref 87–352)
IgM (Immunoglobulin M), Srm: 86 mg/dL (ref 26–217)

## 2018-07-22 LAB — CBC WITH DIFFERENTIAL/PLATELET
BASOS ABS: 0 10*3/uL (ref 0.0–0.2)
Basos: 0 %
EOS (ABSOLUTE): 0.1 10*3/uL (ref 0.0–0.4)
Eos: 2 %
Hematocrit: 42.3 % (ref 34.0–46.6)
Hemoglobin: 13.6 g/dL (ref 11.1–15.9)
IMMATURE GRANULOCYTES: 1 %
Immature Grans (Abs): 0.1 10*3/uL (ref 0.0–0.1)
LYMPHS ABS: 0.8 10*3/uL (ref 0.7–3.1)
Lymphs: 10 %
MCH: 29.3 pg (ref 26.6–33.0)
MCHC: 32.2 g/dL (ref 31.5–35.7)
MCV: 91 fL (ref 79–97)
MONOS ABS: 0.5 10*3/uL (ref 0.1–0.9)
Monocytes: 6 %
NEUTROS PCT: 81 %
Neutrophils Absolute: 6.1 10*3/uL (ref 1.4–7.0)
PLATELETS: 219 10*3/uL (ref 150–450)
RBC: 4.64 x10E6/uL (ref 3.77–5.28)
RDW: 13.6 % (ref 12.3–15.4)
WBC: 7.6 10*3/uL (ref 3.4–10.8)

## 2018-07-22 NOTE — Telephone Encounter (Signed)
LMOM (identified vm) that lab work done in our office yesterday was fine.  She does not need to return this call unless she has questions/fim

## 2018-07-22 NOTE — Telephone Encounter (Signed)
Advised patient that lab work is fine per previous message.

## 2018-07-22 NOTE — Telephone Encounter (Signed)
Noted/fim 

## 2018-07-22 NOTE — Telephone Encounter (Signed)
-----   Message from Britt Bottom, MD sent at 07/22/2018 10:26 AM EDT ----- Please let the patient know that the lab work is fine.

## 2018-08-06 DIAGNOSIS — Z01411 Encounter for gynecological examination (general) (routine) with abnormal findings: Secondary | ICD-10-CM | POA: Diagnosis not present

## 2018-08-11 DIAGNOSIS — G35 Multiple sclerosis: Secondary | ICD-10-CM | POA: Diagnosis not present

## 2018-08-20 ENCOUNTER — Other Ambulatory Visit: Payer: Self-pay | Admitting: Neurology

## 2018-08-20 MED ORDER — AMPHETAMINE-DEXTROAMPHET ER 20 MG PO CP24: 20.0000 mg | ORAL_CAPSULE | ORAL | 0 refills | Status: DC

## 2018-08-20 NOTE — Telephone Encounter (Signed)
Pt called requesting refills for amphetamine-dextroamphetamine (ADDERALL XR) 20 MG 24 hr capsule sent to [facility]

## 2018-09-02 ENCOUNTER — Telehealth: Payer: Self-pay | Admitting: Neurology

## 2018-09-02 NOTE — Telephone Encounter (Signed)
Spoke with Ashley Key. She c/o intermittent brief episodes of sharp pain random body parts (wrists one day, knee the other), onset 2-3 days ago.  We discussed that it is difficult to say what may be causing the pain. It is not likely related to her MS, as MS sx. due to relapse do not generally come and go so quickly. There is no c/o numbness/weakness.  We discussed that she should monitor pain. It may be that she simply had a more active day and is having more pain. If it continues, she may be able to identify trigers. Ice/heat, rest may help. She verbalized understanding of same, is agreeable with monitoring pain for now and will call back if it becomes more frequent or bothersome/fim

## 2018-09-02 NOTE — Telephone Encounter (Signed)
Patient states having intermittent surges of pain in both wrists and also her right knee. Please call and discuss.

## 2018-09-15 DIAGNOSIS — Z780 Asymptomatic menopausal state: Secondary | ICD-10-CM | POA: Diagnosis not present

## 2018-09-18 ENCOUNTER — Other Ambulatory Visit: Payer: Self-pay | Admitting: Neurology

## 2018-09-18 MED ORDER — AMPHETAMINE-DEXTROAMPHET ER 20 MG PO CP24
20.0000 mg | ORAL_CAPSULE | ORAL | 0 refills | Status: DC
Start: 2018-09-18 — End: 2018-10-21

## 2018-09-18 NOTE — Telephone Encounter (Signed)
Pt has called for a refill prescription on her amphetamine-dextroamphetamine (ADDERALL XR) 20 MG 24 hr capsule

## 2018-09-18 NOTE — Addendum Note (Signed)
Addended by: France Ravens I on: 09/18/2018 10:43 AM   Modules accepted: Orders

## 2018-10-21 ENCOUNTER — Other Ambulatory Visit: Payer: Self-pay | Admitting: Neurology

## 2018-10-21 MED ORDER — AMPHETAMINE-DEXTROAMPHET ER 20 MG PO CP24: 20.0000 mg | ORAL_CAPSULE | ORAL | 0 refills | Status: DC

## 2018-10-21 NOTE — Telephone Encounter (Signed)
Pt requesting refills for amphetamine-dextroamphetamine (ADDERALL XR) 20 MG 24 hr capsule sent to [facility]

## 2018-11-11 DIAGNOSIS — I10 Essential (primary) hypertension: Secondary | ICD-10-CM | POA: Diagnosis not present

## 2018-11-11 DIAGNOSIS — D693 Immune thrombocytopenic purpura: Secondary | ICD-10-CM | POA: Diagnosis not present

## 2018-11-11 DIAGNOSIS — E890 Postprocedural hypothyroidism: Secondary | ICD-10-CM | POA: Diagnosis not present

## 2018-11-11 DIAGNOSIS — G35 Multiple sclerosis: Secondary | ICD-10-CM | POA: Diagnosis not present

## 2018-11-11 DIAGNOSIS — F411 Generalized anxiety disorder: Secondary | ICD-10-CM | POA: Diagnosis not present

## 2018-11-19 ENCOUNTER — Other Ambulatory Visit: Payer: Self-pay | Admitting: Neurology

## 2018-11-19 MED ORDER — AMPHETAMINE-DEXTROAMPHET ER 20 MG PO CP24: 20.0000 mg | ORAL_CAPSULE | ORAL | 0 refills | Status: DC

## 2018-11-19 NOTE — Telephone Encounter (Signed)
Pt calling for a refill on her amphetamine-dextroamphetamine (ADDERALL XR) 20 MG 24 hr capsule [facility] [phone]

## 2018-11-19 NOTE — Addendum Note (Signed)
Addended by: France Ravens I on: 11/19/2018 08:48 AM   Modules accepted: Orders

## 2018-12-10 ENCOUNTER — Telehealth: Payer: Self-pay | Admitting: Neurology

## 2018-12-10 NOTE — Telephone Encounter (Signed)
Transferred pt to infusions  °

## 2018-12-16 ENCOUNTER — Other Ambulatory Visit: Payer: Self-pay | Admitting: Neurology

## 2018-12-16 MED ORDER — AMPHETAMINE-DEXTROAMPHET ER 20 MG PO CP24: 20.0000 mg | ORAL_CAPSULE | ORAL | 0 refills | Status: DC

## 2018-12-16 NOTE — Addendum Note (Signed)
Addended by: Hope Pigeon on: 12/16/2018 11:32 AM   Modules accepted: Orders

## 2018-12-16 NOTE — Telephone Encounter (Signed)
Pt has called for a refill on her amphetamine-dextroamphetamine (ADDERALL XR) 20 MG 24 hr capsule ° °[id number]  °

## 2018-12-16 NOTE — Telephone Encounter (Signed)
Checked drug registry. She last refilled 11/21/2019 #30, not receiving from other MD's. Last seen 07/21/18 and next f/u 01/21/2019

## 2018-12-21 ENCOUNTER — Telehealth: Payer: Self-pay

## 2018-12-21 NOTE — Telephone Encounter (Signed)
I called pt back. She states sx have resolved. They are intermittent. I recommended since they are intermittent, unlikely an MS exacerbation. She should continue to monitor. If she has new/worsening sx she should let us know. She has f/u with Dr. Felecia Shelling 01/21/19.  She is unsure if her next Ocrevus infusion is scheduled. She is going to check and if not scheduled, she will call back and speak with Otila Kluver with intrafusion about this.

## 2018-12-21 NOTE — Telephone Encounter (Signed)
Patient mentioned that she has been having a feeling like pins and needles are sticking on her the side of feet and heels, she stated that she it started in her left then 6 hours later she felt it in her left. She also mentioned that her wrist having aching. She stated that this started a week ago. Please advise.

## 2019-01-13 DIAGNOSIS — F4311 Post-traumatic stress disorder, acute: Secondary | ICD-10-CM | POA: Diagnosis not present

## 2019-01-21 ENCOUNTER — Ambulatory Visit: Payer: BLUE CROSS/BLUE SHIELD | Admitting: Neurology

## 2019-01-21 ENCOUNTER — Encounter: Payer: Self-pay | Admitting: Neurology

## 2019-01-21 ENCOUNTER — Other Ambulatory Visit: Payer: Self-pay

## 2019-01-21 VITALS — BP 121/80 | HR 82 | Resp 18 | Ht 66.0 in | Wt 177.0 lb

## 2019-01-21 DIAGNOSIS — R260 Ataxic gait: Secondary | ICD-10-CM | POA: Diagnosis not present

## 2019-01-21 DIAGNOSIS — Z79899 Other long term (current) drug therapy: Secondary | ICD-10-CM | POA: Diagnosis not present

## 2019-01-21 DIAGNOSIS — R41840 Attention and concentration deficit: Secondary | ICD-10-CM | POA: Diagnosis not present

## 2019-01-21 DIAGNOSIS — G35 Multiple sclerosis: Secondary | ICD-10-CM

## 2019-01-21 MED ORDER — AMPHETAMINE-DEXTROAMPHET ER 20 MG PO CP24: 20.0000 mg | ORAL_CAPSULE | ORAL | 0 refills | Status: DC

## 2019-01-21 NOTE — Progress Notes (Signed)
[facility]  PATIENT: Ashley Key DOB: September 25, 1966  REFERRING DOCTOR OR PCP:  Ashley Key (PCP); Ashley Key (ADD) SOURCE: patient, notes from Dr. Orland Key, imaging/lab reports, MRI images on PACS  _________________________________   HISTORICAL  CHIEF COMPLAINT:  Chief Complaint  Patient presents with  . Multiple Sclerosis    Rm. 12.  Sts. she continues to tolerate Ocrevus well.  Next infusion scheduled for first wk. in March. Sts. her home environment is more peaceful now., so she is taking Zoloft less consistently--only 2-3 times per wk. Believes she sleeps better when she doesn't take it/fim    HISTORY OF PRESENT ILLNESS:  Ashley Key is a 53 y.o. woman with MS diagnosed 10/2017 .    Her main issues involve cognition with reduced ability to learn and focus  Update 01/21/2019: She has had a couple Ocrevus infusions and has tolerated them well.  She denies any exacerbations.  Her next infusion will be in March.  She feels she is doing about the same neurologically.  Specifically, her gait is a little off balance but she walks without a cane.  She is able to walk at least a mile without stopping.  She has some numbness in the feet, right greater than left.  This improved for a while but came back a different way (now more in digit 45 rather than 2/3 in past).   There is no significant weakness or spasticity.  Visual acuity is okay.  She does have diplopia now and then, especially when tired in the evening.  She has occasional mild urge incontinence..  She has mild neurocognitive deficits due to the MS and also has attention deficit disorder.  She does better with Adderall.  She tolerates it well.  She has mild depression and is frustrated by the cognitive issues   She is taking sertraline but not regularly.  She is still apathetic.   She is sleeping better.   Her son is now in a boarding school  Update 07/21/2018: She started the Ocrevus and had the 2 halves of the  first dose in March and will have her next infusion.  Her second dose was followed by 3-4 days of increased fatigue but no rashes.    She denies any weakness.  She does have some numbness especially in the right leg.  Her gait is slightly off balance at times.   She is not walking as well as before MS but can walk a mile.    She has mild diplopia at times.  She has had neurocognitive testing and has mild neurocognitive deficit due to MS (I reviewed these in her presence).  She also has ADHD.  She had most difficulties with verbal learning, memory and slow processing speed.   She is noting verbal fluency issues.   Symptomatically, she feels she does better with the Adderall.  She has tolerated well.   She is noting mild depression some days.    Her son has ADHD/OCD and is challenging.    She is on Zoloft and feels it helps her a little bit.       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 Based on my clinical interview with the patient and her history, I concur with the diagnostic impressions described in her previous neuropsychological evaluation by Dr. Norton Key (dated 05/19/2018). Specifically, it appears that the patient is experiencing mild neurocognitive disorder due to MS and preexisting ADHD.  Psychological testing  did not reveal any indication of additional psychiatric component.  I also concur with the recommendations provided by Dr. Vikki Key in his report.  SUMMARY & IMPRESSION (by Ashley Pastel, PhD):  Ashley Key is a 53 year old, right-handed, Caucasian female, who reported experiencing cognitive deficits that began approximately 2 years ago. This is in the context of recently diagnosed relapsing-remitting multiple sclerosis.  Test results revealed intact functioning across most cognitive domains and thinking skills assessed during this evaluation with the exception of reduced aspects of verbal learning and memory as well  as mildly slowed processing speed. From an emotional standpoint, there appears to be at least a moderate degree of recent depression and mild-to-moderate anxiety.  Ashley Key's performance is consistent with a diagnosis of Mild Neurocognitive Disorder (i.e., mild cognitive impairment). With regard to etiology, these results are likely due to the cognitive consequences of multiple sclerosis and mood disruption.   Also of note, she has ITP and the decision to use Ocrevus was discussed with her hematologist.  Update 01/13/2018:    At the last visit we had discussed treatment options and she opted to go on ocrelizumab. She has not yet received her first infusion. We will check with the infusion service.   Her gait is doing the same.    Strength is stable.     She has more numbness.   She has pins and needles numbness in trhe right foot that started a few weeks ago --- she has older numbness in just the second toe x years.    She is noting wrist pain on her right.      She is noting occasional mild diplopia but most of the time vision is stable. She has urinary urgency with rare incontinence.   She notes fatigue on a daily basis. This is mildly helped by Adderall.    She has mild cognitive issues (STM, word finding) and decreased focus/attention.    She notes some depression and frustration associated with worery about not working and cognitive issues.       Update 10/23/2017:   I reviewed the recent MRI of the brain and cervical spine with Ashley Key. The MRI of the cervical spine shows a focus in the mid spinal cord at the cervicomedullary junction. The MRI of the brain with contrast (previously had without) showed 1 subtle small enhancing focus. The combination of her symptoms and MRIs are consistent with multiple sclerosis and we discussed therapeutic options..  We discussed treatment options. As she has potential for a more progressive MS (has a spinal plaque and older symptoms were her first evidence  of disease) and she has ITP, ocrelizumab, an anti-CD20 agent similar to Rituxan, maybe an excellent choice for her MS and also help the ITP.   Alternatively, Tecfidera is a reasonable agent to begin with with MS  Her hematologist for her ITP is Dr. Benay Spice.     From 10/15/2017: Ashley Key is a 53 year old woman who began to note difficulties with her memory 18 months ago when she noted difficuty with focus and attention.   She noted more difficulties with her work.   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    Also, last year, she went to [facility] (Dr. Johnnye Sima) but she was felt not to have ADD after testing.   Adderall did not help her much.  She feels her memory deficits are mostly stable this year and her main worsening occurred in 2017.    She is currently not working and feels less stressed but also mildly depressed as she is not working.   She is doing puzzles and other activities to try to keep her mind stimulated.   She was placed on sertraline 3 years ago when mood initially worsened. She feels her some benefit from it.   She feels gait is usually good.    She is able to climb stairs and a step stool.    She fell in her tiered garden when she fell down the 2 foot drop and broke her foot.   She had one or 2 other milder falls this year.   She does not note any difficulties with strength or sensation.  She has had bladder frequency and urge incontinence (drips) at times that has worsened the past 2 years and even more so over the past 6 months.   She feels vision is worse but she improved with her new glasses.  She denies color vision changes.   She notes more fatigue and more apathy.   Adderall did not help much.  She has insomnia with more trouble staying asleep than falling asleep.  She often wakes up at 2-4 am and then can't fall back asleep.   Her mind is often racing and she gets out of bed.    She denies headaches, rashes or joint pain.  She had ITP 06/2014 with a platelet count of 12.  She was treated with steroids and improved (Dr. Benay Spice) [name] there was persistent thrombocytopenia (last plt count = 79 and mild lymphopenia/leukopenia.  She also has had low level positive ANA.   She was diagnosed with hyperthyroidism 2 years ago and had radioactive treatment.   She is on Synthroid (112 mcg) and Dr. Orland Key is checking her labs.        I personally reviewed the MRI of the brain performed 10/10/2017. It is abnormal showing multiple T2/FLAIR hyperintense foci in the juxtacortical, periventricular and deep white matter. Additionally there is a focus in the left thalamus, right midbrain, right pons and a subtle focus in the right cerebellar hemisphere and possibly at the cervicomedullary junction.  One of the juxtacortical foci on the right is mildly hyperintense on diffusion-weighted images and may be more acute or be artifact.     Montreal Cognitive Assessment  10/15/2017  Visuospatial/ Executive (0/5) 5  Naming (0/3) 3  Attention: Read list of digits (0/2) 2  Attention: Read list of letters (0/1) 1  Attention: Serial 7 subtraction starting at 100 (0/3) 3  Language: Repeat phrase (0/2) 2  Language : Fluency (0/1) 1  Abstraction (0/2) 2  Delayed Recall (0/5) 0  Orientation (0/6) 6  Total 25  Adjusted Score (based on education) 25     REVIEW OF SYSTEMS: Constitutional: No fevers, chills, sweats, or change in appetite.   She has fatigue.   Mild insomnia.    Eyes: No visual changes, double vision, eye pain Ear, nose and throat: No hearing loss, ear pain, nasal congestion, sore throat Cardiovascular: No chest pain, palpitations Respiratory: No shortness of breath at rest or with exertion.   No wheezes GastrointestinaI: No nausea, vomiting, diarrhea, abdominal pain, fecal incontinence Genitourinary: Urinary urgency and  nocturia. Musculoskeletal: No neck pain, back pain Integumentary: No rash, pruritus, skin lesions Neurological: as above Psychiatric: mild depression Endocrine: No palpitations, diaphoresis, change in appetite, change in weigh  or increased thirst Hematologic/Lymphatic: No anemia, purpura, petechiae. Allergic/Immunologic: No itchy/runny eyes, nasal congestion, recent allergic reactions, rashes  ALLERGIES: No Known Allergies  HOME MEDICATIONS:  Current Outpatient Medications:  .  amLODipine (NORVASC) 5 MG tablet, Take by mouth daily., Disp: , Rfl: 2 .  amphetamine-dextroamphetamine (ADDERALL XR) 20 MG 24 hr capsule, Take 1 capsule (20 mg total) by mouth every morning., Disp: 30 capsule, Rfl: 0 .  CALCIUM-VITAMIN D PO, Take 1 tablet by mouth daily., Disp: , Rfl:  .  COMBIPATCH 0.05-0.14 MG/DAY, , Disp: , Rfl:  .  Multiple Vitamins-Minerals (MULTIVITAMIN PO), Take 1 tablet by mouth daily., Disp: , Rfl:  .  ocrelizumab 600 mg in sodium chloride 0.9 % 500 mL, Inject 600 mg into the vein every 6 (six) months. , Disp: , Rfl:  .  Omega-3 Fatty Acids (FISH OIL) 1200 MG CAPS, Take 1,200 mg by mouth daily., Disp: , Rfl:  .  rosuvastatin (CRESTOR) 5 MG tablet, TAKE 1 TABLET(5 MG) BY MOUTH DAILY, Disp: 30 tablet, Rfl: 0 .  sertraline (ZOLOFT) 100 MG tablet, TK 1 T PO ONCE D, Disp: , Rfl: 0 .  SYNTHROID 112 MCG tablet, TAKE 1 TABLET BY MOUTH ON AN EMPTY STOMACH 30 MINUTES BEFORE BREAKFAST, Disp: , Rfl: 5 No current facility-administered medications for this visit.   Facility-Administered Medications Ordered in Other Visits:  .  gadopentetate dimeglumine (MAGNEVIST) injection 15 mL, 15 mL, Intravenous, Once PRN, Baker Kogler, Nanine Means, MD  PAST MEDICAL HISTORY: Past Medical History:  Diagnosis Date  . ADHD (attention deficit hyperactivity disorder)   . Arch pain   . Arthritis    FEET  . Blood dyscrasia    NORMAL PLATLETS 50-70'S  . Bunion   . Graves disease   . Headache    HISTORY MIGRAINE  .  Hypertension   . ITP (idiopathic thrombocytopenic purpura)   . Pelvic fracture ([place])   . Pneumonia     PAST SURGICAL HISTORY: Past Surgical History:  Procedure Laterality Date  . DILITATION & CURRETTAGE/HYSTROSCOPY WITH HYDROTHERMAL ABLATION N/A 09/27/2015   Procedure: DILATATION & CURETTAGE/HYSTEROSCOPY WITH HYDROTHERMAL ABLATION;  Surgeon: Thurnell Lose, MD;  Location: [facility];  Service: Gynecology;  Laterality: N/A;  ultrasound guidance   . FOOT SURGERY    . THYROID RADATION 2016    . WISDOM TOOTH EXTRACTION      FAMILY HISTORY: Family History  Problem Relation Age of Onset  . CAD Mother   . Heart attack Mother   . Stroke Father   . Heart attack Father   . CAD Brother   . Heart attack Brother   . Cancer Brother        prostate cancer  . Heart attack Maternal Grandmother   . Stroke Maternal Grandmother   . Stroke Paternal Grandmother     SOCIAL HISTORY:  Social History   Socioeconomic History  . Marital status: Married    Spouse name: Collier Salina  . Number of children: 1  . Years of education: Not on file  . Highest education level: Not on file  Occupational History  . Occupation: Clinical research associate: [employer]  Social Needs  . Financial resource strain: Not on file  . Food insecurity:    Worry: Not on file    Inability: Not on file  . Transportation needs:    Medical: Not on file    Non-medical: Not on file  Tobacco Use  . Smoking status: Never Smoker  . Smokeless tobacco: Never  Used  Substance and Sexual Activity  . Alcohol use: Yes    Comment: social  . Drug use: No  . Sexual activity: Yes  Lifestyle  . Physical activity:    Days per week: Not on file    Minutes per session: Not on file  . Stress: Not on file  Relationships  . Social connections:    Talks on phone: Not on file    Gets together: Not on file    Attends religious service: Not on file    Active member of club or organization: Not on file    Attends meetings of clubs  or organizations: Not on file    Relationship status: Not on file  . Intimate partner violence:    Fear of current or ex partner: Not on file    Emotionally abused: Not on file    Physically abused: Not on file    Forced sexual activity: Not on file  Other Topics Concern  . 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   General: The patient is well-developed and well-nourished and in no acute distress   Neurologic Exam  Mental status: The patient is alert and oriented x 3 at the time of the examination. Short-term recall and attention are reduced.  Speech is normal.  Cranial nerves: Extraocular movements are full. Facial strength and sensation is normal.  The tongue is midline, and the patient has symmetric elevation of the soft palate. No obvious hearing deficits are noted.  Motor:  Muscle bulk is normal.   Tone is normal. Strength is  5 / 5 in all 4 extremities.   Sensory:   She has reduced vibration and temperature sensation in the right leg.   Coordination: There is good finger-nose-finger and mildly reduced heel-to-shin, right worse than left.  Gait and station: Station is normal.   Her gait is mildly wide.   Tandem gait is moderately wide. Romberg is negative.  Reflexes: Deep tendon reflexes are symmetric and 2 in arms and 3 in legs bilaterally with spread at the knees. No ankle clonus.       DIAGNOSTIC DATA (LABS, IMAGING, TESTING) - I reviewed patient records, labs, notes, testing and imaging myself where available.  Lab Results  Component Value Date   WBC 7.6 07/21/2018   HGB 13.6 07/21/2018   HCT 42.3 07/21/2018    MCV 91 07/21/2018   PLT 219 07/21/2018      Component Value Date/Time   NA 136 12/10/2015 0930   K 4.4 12/10/2015 0930   CL 107 12/10/2015 0930   CO2 22 12/10/2015 0930   GLUCOSE 94 12/10/2015 0930   BUN 21 (H) 12/10/2015 0930   CREATININE 0.66 12/10/2015 0930   CALCIUM 8.6 (L) 12/10/2015 0930   PROT 7.2 10/23/2017 1234   ALBUMIN 4.5 10/23/2017 1234   AST 17 10/23/2017 1234   ALT 11 10/23/2017 1234   ALKPHOS 57 10/23/2017 1234   BILITOT 0.2 10/23/2017 1234   GFRNONAA >60 12/10/2015 0930  GFRAA >60 12/10/2015 0930   Lab Results  Component Value Date   CHOL 224 (H) 01/22/2016   HDL 72 01/22/2016   LDLCALC 139 (H) 01/22/2016   TRIG 64 01/22/2016   CHOLHDL 3.1 01/22/2016   No results found for: HGBA1C Lab Results  Component Value Date   VITAMINB12 >2,000 (H) 06/28/2014   Lab Results  Component Value Date   TSH 1.740 10/15/2017       ASSESSMENT AND PLAN  Multiple sclerosis ([place]) - Plan: IgG, IgA, IgM, CBC with Differential/Platelet, MR BRAIN W WO CONTRAST  High risk medication use - Plan: IgG, IgA, IgM, CBC with Differential/Platelet  Ataxic gait  Attention deficit   1.   Continue ocrelizumab for her MS.   Check labs.      Check MRi brain to determine if subclinical progression.  If present, consider a different DMT.   2.   Due to the combination of physical impairments, cognitive impairments and fatigue, she is unable to work 3.    Adderall XR 20 mg.    We will renew 4.   She will return for her infusion and follow-up with me for regular visit in about 6 months. She should call with any new or worsening neurologic symptoms.  Silverio Hagan A. Felecia Shelling, MD, PhD, FAAN Certified in Neurology, Clinical Neurophysiology, Sleep Medicine, Pain Medicine and Neuroimaging Director, [place] [facility] [address] [place], [place] [address] [phone]

## 2019-01-22 LAB — CBC WITH DIFFERENTIAL/PLATELET
Basophils Absolute: 0 10*3/uL (ref 0.0–0.2)
Basos: 1 %
EOS (ABSOLUTE): 0.1 10*3/uL (ref 0.0–0.4)
Eos: 2 %
Hematocrit: 42.1 % (ref 34.0–46.6)
Hemoglobin: 14.4 g/dL (ref 11.1–15.9)
Immature Grans (Abs): 0 10*3/uL (ref 0.0–0.1)
Immature Granulocytes: 0 %
LYMPHS ABS: 0.6 10*3/uL — AB (ref 0.7–3.1)
Lymphs: 17 %
MCH: 30.2 pg (ref 26.6–33.0)
MCHC: 34.2 g/dL (ref 31.5–35.7)
MCV: 88 fL (ref 79–97)
Monocytes Absolute: 0.6 10*3/uL (ref 0.1–0.9)
Monocytes: 17 %
Neutrophils Absolute: 2.2 10*3/uL (ref 1.4–7.0)
Neutrophils: 63 %
Platelets: 197 10*3/uL (ref 150–450)
RBC: 4.77 x10E6/uL (ref 3.77–5.28)
RDW: 12.1 % (ref 11.7–15.4)
WBC: 3.5 10*3/uL (ref 3.4–10.8)

## 2019-01-22 LAB — IGG, IGA, IGM
IgA/Immunoglobulin A, Serum: 49 mg/dL — ABNORMAL LOW (ref 87–352)
IgG (Immunoglobin G), Serum: 1004 mg/dL (ref 700–1600)
IgM (Immunoglobulin M), Srm: 85 mg/dL (ref 26–217)

## 2019-01-25 ENCOUNTER — Telehealth: Payer: Self-pay | Admitting: *Deleted

## 2019-01-25 NOTE — Telephone Encounter (Signed)
Pt called back. I relayed results per Dr. Felecia Shelling note. She verbalized understanding and appreciation.   Reminded her of f/u on 07/22/19 at [facility] she will be called to schedule MRI once approved via insurance.

## 2019-01-25 NOTE — Telephone Encounter (Signed)
-----   Message from Britt Bottom, MD sent at 01/22/2019  1:26 PM EST ----- The IgA (one of the antibody types) was a little lower.   If it drops further when we recheck in 6 months, I may lower the Ocrevus dose

## 2019-01-25 NOTE — Telephone Encounter (Signed)
Tried calling pt, mailbox full and could not LVM.

## 2019-01-26 ENCOUNTER — Telehealth: Payer: Self-pay | Admitting: Neurology

## 2019-01-26 NOTE — Telephone Encounter (Signed)
MR Brain w/wo contrast Dr.[name] [name]: [id number] (exp. 01/25/19 to 02/23/19). Patient is scheduled at [facility] for 02/02/19.

## 2019-01-27 DIAGNOSIS — Z0289 Encounter for other administrative examinations: Secondary | ICD-10-CM

## 2019-02-02 ENCOUNTER — Ambulatory Visit: Payer: BLUE CROSS/BLUE SHIELD

## 2019-02-02 DIAGNOSIS — G35 Multiple sclerosis: Secondary | ICD-10-CM | POA: Diagnosis not present

## 2019-02-02 MED ORDER — GADOBENATE DIMEGLUMINE 529 MG/ML IV SOLN
15.0000 mL | Freq: Once | INTRAVENOUS | Status: AC | PRN
  Administered 2019-02-02: 15 mL via INTRAVENOUS

## 2019-02-04 ENCOUNTER — Telehealth: Payer: Self-pay | Admitting: *Deleted

## 2019-02-04 NOTE — Telephone Encounter (Signed)
Pt. returned my call. I was on another call at the time and asked Jael to let pt. know that her MRI did not show any new MS lesions.  Per Jael, pt. verbalized understanding of same/fim

## 2019-02-04 NOTE — Telephone Encounter (Signed)
VM full/fim 

## 2019-02-04 NOTE — Telephone Encounter (Signed)
-----   Message from Britt Bottom, MD sent at 02/04/2019  3:16 PM EST ----- Please let the patient know that the MRi looks good no new MS lesions since last MRi

## 2019-02-05 DIAGNOSIS — E890 Postprocedural hypothyroidism: Secondary | ICD-10-CM | POA: Diagnosis not present

## 2019-02-05 DIAGNOSIS — E0500 Thyrotoxicosis with diffuse goiter without thyrotoxic crisis or storm: Secondary | ICD-10-CM | POA: Diagnosis not present

## 2019-02-10 DIAGNOSIS — G35 Multiple sclerosis: Secondary | ICD-10-CM | POA: Diagnosis not present

## 2019-02-19 ENCOUNTER — Other Ambulatory Visit: Payer: Self-pay | Admitting: Neurology

## 2019-02-19 MED ORDER — AMPHETAMINE-DEXTROAMPHET ER 20 MG PO CP24: 20.0000 mg | ORAL_CAPSULE | ORAL | 0 refills | Status: DC

## 2019-02-19 NOTE — Telephone Encounter (Signed)
Pt request refill for amphetamine-dextroamphetamine (ADDERALL XR) 20 MG 24 hr capsule sent to [facility]. Pt is aware the clinic closes at noon today. She has enough thru Monday

## 2019-03-14 DIAGNOSIS — S46911A Strain of unspecified muscle, fascia and tendon at shoulder and upper arm level, right arm, initial encounter: Secondary | ICD-10-CM | POA: Diagnosis not present

## 2019-03-18 ENCOUNTER — Other Ambulatory Visit: Payer: Self-pay | Admitting: Neurology

## 2019-03-18 MED ORDER — AMPHETAMINE-DEXTROAMPHET ER 20 MG PO CP24: 20.0000 mg | ORAL_CAPSULE | ORAL | 0 refills | Status: DC

## 2019-03-18 NOTE — Telephone Encounter (Signed)
Pt has called for a refill on her amphetamine-dextroamphetamine (ADDERALL XR) 20 MG 24 hr capsule [facility] [phone]

## 2019-03-18 NOTE — Addendum Note (Signed)
Addended by: Hope Pigeon on: 03/18/2019 09:59 AM   Modules accepted: Orders

## 2019-03-22 DIAGNOSIS — S4991XA Unspecified injury of right shoulder and upper arm, initial encounter: Secondary | ICD-10-CM | POA: Diagnosis not present

## 2019-03-26 DIAGNOSIS — M25511 Pain in right shoulder: Secondary | ICD-10-CM | POA: Diagnosis not present

## 2019-04-08 DIAGNOSIS — M25511 Pain in right shoulder: Secondary | ICD-10-CM | POA: Diagnosis not present

## 2019-04-16 DIAGNOSIS — S43021D Posterior subluxation of right humerus, subsequent encounter: Secondary | ICD-10-CM | POA: Diagnosis not present

## 2019-04-16 DIAGNOSIS — M25511 Pain in right shoulder: Secondary | ICD-10-CM | POA: Diagnosis not present

## 2019-04-21 DIAGNOSIS — M25511 Pain in right shoulder: Secondary | ICD-10-CM | POA: Diagnosis not present

## 2019-04-28 ENCOUNTER — Telehealth: Payer: Self-pay | Admitting: Neurology

## 2019-04-28 DIAGNOSIS — M25511 Pain in right shoulder: Secondary | ICD-10-CM | POA: Diagnosis not present

## 2019-04-28 MED ORDER — AMPHETAMINE-DEXTROAMPHET ER 20 MG PO CP24: 20.0000 mg | ORAL_CAPSULE | ORAL | 0 refills | Status: DC

## 2019-04-28 NOTE — Telephone Encounter (Signed)
I checked drug registry. She last refilled adderall 20mg  03/24/19 #30. Last seen 01/21/19 and next f/u  07/22/19.    I called pt and LVM advising it was ok for her to resume Adderall and I will send request to Dr. Felecia Shelling for him to send to her pharmacy. Gave [facility] phone number if she has further questions

## 2019-04-28 NOTE — Addendum Note (Signed)
Addended by: Hope Pigeon on: 04/28/2019 02:44 PM   Modules accepted: Orders

## 2019-04-28 NOTE — Telephone Encounter (Signed)
Pt states that it was not until she went to fill her weekly or bi weekly pill bottles that she realized she has not had her amphetamine-dextroamphetamine (ADDERALL XR) 20 MG 24 hr capsule in about 10 days.  Pt states with her form of MS her short term memory is affected.  Pt is asking for a call to discuss being without the amphetamine-dextroamphetamine (ADDERALL XR) 20 MG 24 hr capsule for this length of time and if Dr Felecia Shelling still wants her to take it.  If so pt still uses  [facility] [phone]

## 2019-04-28 NOTE — Addendum Note (Signed)
Addended by: Britt Bottom on: 04/28/2019 05:39 PM   Modules accepted: Orders

## 2019-05-05 DIAGNOSIS — M25511 Pain in right shoulder: Secondary | ICD-10-CM | POA: Diagnosis not present

## 2019-05-10 ENCOUNTER — Telehealth: Payer: Self-pay | Admitting: Neurology

## 2019-05-10 NOTE — Telephone Encounter (Signed)
We can move her into an empty slot this week or next

## 2019-05-10 NOTE — Telephone Encounter (Signed)
Pt is calling in wanting to know if her appt can be moved up due to numbness in her feet, she states she is having trouble walking.  CB# [phone]

## 2019-05-10 NOTE — Telephone Encounter (Signed)
Dr. [name]- what would you recommend? 

## 2019-05-10 NOTE — Telephone Encounter (Signed)
Called pt back. She is a frequent walker. She has a walking partner and she asked her if she noticed any difference in her gait/walking. Her friend told her she starts to limp on the right side. Her friend felt this was new within the last year.  Numbness intermittent. When she walks, she finds herself walking less than she used to d/t fatigue and both her heels go numb. Today she was only able to walk 2000 steps before her heels went numb.   Advised I will discuss with Dr. Felecia Shelling and call back today or tomorrow with suggestions

## 2019-05-10 NOTE — Telephone Encounter (Signed)
Pt called back. I took call from phone staff. Scheduled f/u with Dr. Felecia Shelling 05/19/19 at [facility] new check in process. Updated med list, pharmacy, allergies on file. She passed covid-19 screening. She is aware to wear mask to appt.

## 2019-05-10 NOTE — Telephone Encounter (Signed)
Called, LVM for pt to call office and schedule f/u for next week to see Dr. Felecia Shelling.

## 2019-05-12 ENCOUNTER — Other Ambulatory Visit: Payer: Self-pay | Admitting: Family Medicine

## 2019-05-12 DIAGNOSIS — Z1231 Encounter for screening mammogram for malignant neoplasm of breast: Secondary | ICD-10-CM

## 2019-05-12 DIAGNOSIS — M25511 Pain in right shoulder: Secondary | ICD-10-CM | POA: Diagnosis not present

## 2019-05-13 DIAGNOSIS — D693 Immune thrombocytopenic purpura: Secondary | ICD-10-CM | POA: Diagnosis not present

## 2019-05-13 DIAGNOSIS — G35 Multiple sclerosis: Secondary | ICD-10-CM | POA: Diagnosis not present

## 2019-05-13 DIAGNOSIS — E890 Postprocedural hypothyroidism: Secondary | ICD-10-CM | POA: Diagnosis not present

## 2019-05-13 DIAGNOSIS — I10 Essential (primary) hypertension: Secondary | ICD-10-CM | POA: Diagnosis not present

## 2019-05-14 ENCOUNTER — Ambulatory Visit: Payer: BLUE CROSS/BLUE SHIELD

## 2019-05-19 ENCOUNTER — Encounter: Payer: Self-pay | Admitting: Neurology

## 2019-05-19 ENCOUNTER — Ambulatory Visit (INDEPENDENT_AMBULATORY_CARE_PROVIDER_SITE_OTHER): Payer: BC Managed Care – PPO | Admitting: Neurology

## 2019-05-19 ENCOUNTER — Other Ambulatory Visit: Payer: Self-pay

## 2019-05-19 VITALS — BP 110/80 | HR 83 | Temp 98.0°F | Ht 66.0 in | Wt 168.5 lb

## 2019-05-19 DIAGNOSIS — Z79899 Other long term (current) drug therapy: Secondary | ICD-10-CM

## 2019-05-19 DIAGNOSIS — G35 Multiple sclerosis: Secondary | ICD-10-CM

## 2019-05-19 DIAGNOSIS — R260 Ataxic gait: Secondary | ICD-10-CM

## 2019-05-19 DIAGNOSIS — R200 Anesthesia of skin: Secondary | ICD-10-CM

## 2019-05-19 DIAGNOSIS — F09 Unspecified mental disorder due to known physiological condition: Secondary | ICD-10-CM

## 2019-05-19 DIAGNOSIS — R413 Other amnesia: Secondary | ICD-10-CM

## 2019-05-19 NOTE — Progress Notes (Signed)
[facility]  PATIENT: Ashley Key DOB: 12/20/1965  REFERRING DOCTOR OR PCP:  London Pepper (PCP); Rachel Moulds (ADD) SOURCE: patient, notes from Dr. Orland Mustard, imaging/lab reports, MRI images on PACS  _________________________________   HISTORICAL  CHIEF COMPLAINT:  Chief Complaint  Patient presents with   Follow-up    RM 12, alone. Here for MS f/u.    Multiple Sclerosis    Last seen 01/21/19. On Ocrevus.   Gait Problem    She has noticed a change in gait/ambulation. Also having numbness with increased walking.     HISTORY OF PRESENT ILLNESS:  Ashley Key is a 53 y.o. woman with MS diagnosed 10/2017 .    Her main issues involve cognition with reduced ability to learn and focus  Update 05/19/2019: She is on Ocrevus and tolerates it well.   She walks a couple times a week with a friend on a flat surface.  After a 1/2 mile she has a noticeable limp, with some right leg weakness.   Gait is usually good most of the day.    She also notes right toe numbness (always 1st and 2nd and sometimes the rest).  Her heels also go numb, sometimes.   This usually occurs while sitting and she sometimes notes sciatic type pain, as well.    She has urinary urgency.     No issues with vision.   She notes mild decreased cognition with reduced verbal fluency and processing speed.   Fatigue is worse with heat.    Adderall helps the cognition and fatigue.  She is sleeping ok most nights.   She rarely takes sertraline at night (2/month).    Mood is ok.   She has sertraline but rarely takes.     Update 01/21/2019: She has had a couple Ocrevus infusions and has tolerated them well.  She denies any exacerbations.  Her next infusion will be in March.  She feels she is doing about the same neurologically.  Specifically, her gait is a little off balance but she walks without a cane.  She is able to walk at least a mile without stopping.  She has some numbness in the feet, right greater than  left.  This improved for a while but came back a different way (now more in digit 45 rather than 2/3 in past).   There is no significant weakness or spasticity.  Visual acuity is okay.  She does have diplopia now and then, especially when tired in the evening.  She has occasional mild urge incontinence..  She has mild neurocognitive deficits due to the MS and also has attention deficit disorder.  She does better with Adderall.  She tolerates it well.  She has mild depression and is frustrated by the cognitive issues   She is taking sertraline but not regularly.  She is still apathetic.   She is sleeping better.   Her son is now in a boarding school  Update 07/21/2018: She started the Ocrevus and had the 2 halves of the first dose in March and will have her next infusion.  Her second dose was followed by 3-4 days of increased fatigue but no rashes.    She denies any weakness.  She does have some numbness especially in the right leg.  Her gait is slightly off balance at times.   She is not walking as well as before MS but can walk a mile.    She has mild diplopia at times.  She has  had neurocognitive testing and has mild neurocognitive deficit due to MS (I reviewed these in her presence).  She also has ADHD.  She had most difficulties with verbal learning, memory and slow processing speed.   She is noting verbal fluency issues.   Symptomatically, she feels she does better with the Adderall.  She has tolerated well.   She is noting mild depression some days.    Her son has ADHD/OCD and is challenging.    She is on Zoloft and feels it helps her a little bit.       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 Based on my clinical interview with the patient and her history, I concur with the diagnostic impressions described in her previous neuropsychological evaluation by Dr. Norton Pastel (dated 05/19/2018). Specifically, it appears that the patient is  experiencing mild neurocognitive disorder due to MS and preexisting ADHD.  Psychological testing did not reveal any indication of additional psychiatric component.  I also concur with the recommendations provided by Dr. Vikki Ports in his report.  SUMMARY & IMPRESSION (by Norton Pastel, PhD):  Ashley Key is a 53 year old, right-handed, Caucasian female, who reported experiencing cognitive deficits that began approximately 2 years ago. This is in the context of recently diagnosed relapsing-remitting multiple sclerosis.  Test results revealed intact functioning across most cognitive domains and thinking skills assessed during this evaluation with the exception of reduced aspects of verbal learning and memory as well as mildly slowed processing speed. From an emotional standpoint, there appears to be at least a moderate degree of recent depression and mild-to-moderate anxiety.  Mrs. Gronau's performance is consistent with a diagnosis of Mild Neurocognitive Disorder (i.e., mild cognitive impairment). With regard to etiology, these results are likely due to the cognitive consequences of multiple sclerosis and mood disruption.   Also of note, she has ITP and the decision to use Ocrevus was discussed with her hematologist.  Update 01/13/2018:    At the last visit we had discussed treatment options and she opted to go on ocrelizumab. She has not yet received her first infusion. We will check with the infusion service.   Her gait is doing the same.    Strength is stable.     She has more numbness.   She has pins and needles numbness in trhe right foot that started a few weeks ago --- she has older numbness in just the second toe x years.    She is noting wrist pain on her right.      She is noting occasional mild diplopia but most of the time vision is stable. She has urinary urgency with rare incontinence.   She notes fatigue on a daily basis. This is mildly helped by Adderall.    She has mild cognitive  issues (STM, word finding) and decreased focus/attention.    She notes some depression and frustration associated with worery about not working and cognitive issues.       Update 10/23/2017:   I reviewed the recent MRI of the brain and cervical spine with Ms. Brau. The MRI of the cervical spine shows a focus in the mid spinal cord at the cervicomedullary junction. The MRI of the brain with contrast (previously had without) showed 1 subtle small enhancing focus. The combination of her symptoms and MRIs are consistent with multiple sclerosis and we discussed therapeutic options..  We discussed treatment options. As she has potential for a more progressive MS (has  a spinal plaque and older symptoms were her first evidence of disease) and she has ITP, ocrelizumab, an anti-CD20 agent similar to Rituxan, maybe an excellent choice for her MS and also help the ITP.   Alternatively, Tecfidera is a reasonable agent to begin with with MS  Her hematologist for her ITP is Dr. Benay Spice.     From 10/15/2017: Cortne Amara is a 53 year old woman who began to note difficulties with her memory 18 months ago when she noted difficuty with focus and attention.   She noted more difficulties with her work.   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    Also, last year, she went to [facility] (Dr. Johnnye Sima) but she was felt not to have ADD after testing.   Adderall did not help her much.     She feels her memory deficits are mostly stable this year and her main worsening occurred in 2017.    She is currently not working and feels less stressed but also mildly depressed as she is not working.   She is doing puzzles and other activities to try to keep her mind stimulated.   She was placed on sertraline 3 years ago when mood initially worsened. She feels her some benefit from it.   She feels  gait is usually good.    She is able to climb stairs and a step stool.    She fell in her tiered garden when she fell down the 2 foot drop and broke her foot.   She had one or 2 other milder falls this year.   She does not note any difficulties with strength or sensation.  She has had bladder frequency and urge incontinence (drips) at times that has worsened the past 2 years and even more so over the past 6 months.   She feels vision is worse but she improved with her new glasses.  She denies color vision changes.   She notes more fatigue and more apathy.   Adderall did not help much.  She has insomnia with more trouble staying asleep than falling asleep.  She often wakes up at 2-4 am and then can't fall back asleep.  Her mind is often racing and she gets out of bed.    She denies headaches, rashes or joint pain.  She had ITP 06/2014 with a platelet count of 12.  She was treated with steroids and improved (Dr. Benay Spice) [name] there was persistent thrombocytopenia (last plt count = 79 and mild lymphopenia/leukopenia.  She also has had low level positive ANA.   She was diagnosed with hyperthyroidism 2 years ago and had radioactive treatment.   She is on Synthroid (112 mcg) and Dr. Orland Mustard is checking her labs.        I personally reviewed the MRI of the brain performed 10/10/2017. It is abnormal showing multiple T2/FLAIR hyperintense foci in the juxtacortical, periventricular and deep white matter. Additionally there is a focus in the left thalamus, right midbrain, right pons and a subtle focus in the right cerebellar hemisphere and possibly at the cervicomedullary junction.  One of the juxtacortical foci on the right is mildly hyperintense on diffusion-weighted images and may be more acute or be artifact.     Montreal Cognitive Assessment  10/15/2017  Visuospatial/ Executive (0/5) 5  Naming (0/3) 3  Attention: Read list of digits (0/2) 2  Attention: Read list of letters (0/1) 1  Attention: Serial 7  subtraction starting at 100 (0/3) 3  Language: Repeat phrase (0/2) 2  Language : Fluency (0/1) 1  Abstraction (0/2) 2  Delayed Recall (0/5) 0  Orientation (0/6) 6  Total 25  Adjusted Score (based on education) 25     REVIEW OF SYSTEMS: Constitutional: No fevers, chills, sweats, or change in appetite.   She has fatigue.   Mild insomnia.    Eyes: No visual changes, double vision, eye pain Ear, nose and throat: No hearing loss, ear pain, nasal congestion, sore throat Cardiovascular: No chest pain, palpitations Respiratory: No shortness of breath at rest or with exertion.   No wheezes GastrointestinaI: No nausea, vomiting, diarrhea, abdominal pain, fecal incontinence Genitourinary: Urinary urgency and nocturia. Musculoskeletal: No neck pain, back pain Integumentary: No rash, pruritus, skin lesions Neurological: as above Psychiatric: mild depression Endocrine: No palpitations, diaphoresis, change in appetite, change in weigh or increased thirst Hematologic/Lymphatic: No anemia, purpura, petechiae. Allergic/Immunologic: No itchy/runny eyes, nasal congestion, recent allergic reactions, rashes  ALLERGIES: No Known Allergies  HOME MEDICATIONS:  Current Outpatient Medications:    amLODipine (NORVASC) 5 MG tablet, Take by mouth daily., Disp: , Rfl: 2   amphetamine-dextroamphetamine (ADDERALL XR) 20 MG 24 hr capsule, Take 1 capsule (20 mg total) by mouth every morning., Disp: 30 capsule, Rfl: 0   CALCIUM-VITAMIN D PO, Take 1 tablet by mouth daily., Disp: , Rfl:    COMBIPATCH 0.05-0.14 MG/DAY, , Disp: , Rfl:    Multiple Vitamins-Minerals (MULTIVITAMIN PO), Take 1 tablet by mouth daily., Disp: , Rfl:    ocrelizumab 600 mg in sodium chloride 0.9 % 500 mL, Inject 600 mg into the vein every 6 (six) months. , Disp: , Rfl:    Omega-3 Fatty Acids (FISH OIL) 1200 MG CAPS, Take 1,200 mg by mouth daily., Disp: , Rfl:    rosuvastatin (CRESTOR) 5 MG tablet, TAKE 1 TABLET(5 MG) BY MOUTH  DAILY, Disp: 30 tablet, Rfl: 0   sertraline (ZOLOFT) 100 MG tablet, TK 1 T PO ONCE D, Disp: , Rfl: 0   SYNTHROID 112 MCG tablet, TAKE 1 TABLET BY MOUTH ON AN EMPTY STOMACH 30 MINUTES BEFORE BREAKFAST, Disp: , Rfl: 5 No current facility-administered medications for this visit.   Facility-Administered Medications Ordered in Other Visits:    gadopentetate dimeglumine (MAGNEVIST) injection 15 mL, 15 mL, Intravenous, Once PRN, Taner Rzepka, Nanine Means, MD  PAST MEDICAL HISTORY: Past Medical History:  Diagnosis Date   ADHD (attention deficit hyperactivity disorder)    Arch pain    Arthritis    FEET   Blood dyscrasia    NORMAL PLATLETS 50-70'S   Bunion    Graves disease    Headache    HISTORY MIGRAINE   Hypertension    ITP (idiopathic thrombocytopenic purpura)    Pelvic fracture ([place])    Pneumonia     PAST SURGICAL HISTORY: Past Surgical History:  Procedure Laterality Date   DILITATION & CURRETTAGE/HYSTROSCOPY WITH HYDROTHERMAL ABLATION N/A 09/27/2015   Procedure: DILATATION & CURETTAGE/HYSTEROSCOPY WITH HYDROTHERMAL ABLATION;  Surgeon: Thurnell Lose, MD;  Location: [facility];  Service: Gynecology;  Laterality: N/A;  ultrasound guidance    FOOT SURGERY     THYROID RADATION 2016     WISDOM TOOTH EXTRACTION      FAMILY HISTORY: Family History  Problem Relation Age of Onset   CAD Mother    Heart attack Mother    Stroke Father    Heart attack Father  CAD Brother    Heart attack Brother    Cancer Brother        prostate cancer   Heart attack Maternal Grandmother    Stroke Maternal Grandmother    Stroke Paternal Grandmother     SOCIAL HISTORY:  Social History   Socioeconomic History   Marital status: Married    Spouse name: Collier Salina   Number of children: 1   Years of education: Not on file   Highest education level: Not on file  Occupational History   Occupation: Clinical research associate: [employer]  resource strain: Not on file   Food insecurity:    Worry: Not on file    Inability: Not on file   Transportation needs:    Medical: Not on file    Non-medical: Not on file  Tobacco Use   Smoking status: Never Smoker   Smokeless tobacco: Never Used  Substance and Sexual Activity   Alcohol use: Yes    Comment: social   Drug use: No   Sexual activity: Yes  Lifestyle   Physical activity:    Days per week: Not on file    Minutes per session: Not on file   Stress: Not on file  Relationships   Social connections:    Talks on phone: Not on file    Gets together: Not on file    Attends religious service: Not on file    Active member of club or organization: Not on file    Attends meetings of clubs or organizations: Not on file    Relationship status: Not on file   Intimate partner violence:    Fear of current or ex partner: Not on file    Emotionally abused: Not on file    Physically abused: Not on file    Forced sexual activity: Not on file  Other Topics Concern   Not on file  Social History Narrative   Married, husband Collier Salina   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   General: The patient is well-developed and well-nourished and in no acute distress   Neurologic Exam  Mental status: The patient is alert and oriented x 3 at the time of the examination. Short-term recall and attention are reduced.  Speech is normal.  Cranial nerves: Extraocular movements are full. Facial strength and sensation is normal.  The tongue is midline, and the patient has symmetric elevation of the soft palate. No obvious hearing deficits are noted.  Motor:  Muscle bulk is normal.    Muscle tone is normal.. Strength is  5 / 5 in all 4 extremities.   Sensory:   She has reduced vibration and temperature sensation in the right leg.   Coordination: There is good finger-nose-finger and mildly reduced heel-to-shin, right worse than left.  Gait and station: Station is normal.   The gait is mildly wide.  Tandem gait is moderately wide.  Romberg is negative.  Reflexes: Deep tendon reflexes are symmetric and 2 in arms and  3 in legs bilaterally with spread at the knees.  There is no ankle clonus.    DIAGNOSTIC DATA (LABS, IMAGING, TESTING) - I reviewed patient records, labs, notes, testing and imaging myself where available.  Lab Results  Component Value Date   WBC 3.5 01/21/2019   HGB 14.4 01/21/2019   HCT 42.1 01/21/2019   MCV 88 01/21/2019   PLT 197 01/21/2019      Component Value Date/Time   NA 136 12/10/2015 0930   K 4.4 12/10/2015 0930   CL 107 12/10/2015 0930   CO2 22 12/10/2015 0930   GLUCOSE 94 12/10/2015 0930   BUN 21 (H) 12/10/2015 0930   CREATININE 0.66 12/10/2015 0930   CALCIUM 8.6 (L) 12/10/2015 0930   PROT 7.2 10/23/2017 1234   ALBUMIN 4.5 10/23/2017 1234   AST 17 10/23/2017 1234   ALT 11 10/23/2017 1234   ALKPHOS 57 10/23/2017 1234   BILITOT 0.2 10/23/2017 1234   GFRNONAA >60 12/10/2015 0930   GFRAA >60 12/10/2015 0930   Lab Results  Component Value Date   CHOL 224 (H) 01/22/2016   HDL 72 01/22/2016   LDLCALC 139 (H) 01/22/2016   TRIG 64 01/22/2016   CHOLHDL 3.1 01/22/2016   No results found for: HGBA1C Lab Results  Component Value Date   VITAMINB12 >2,000 (H) 06/28/2014   Lab Results  Component Value Date   TSH 1.740 10/15/2017       ASSESSMENT AND PLAN  Multiple sclerosis (HCC)  Numbness  Memory loss  Ataxic gait  High risk medication use  Cognitive deficit secondary to multiple sclerosis ([place])   1.   Continue ocrelizumab for her MS. we briefly discussed that Ocrevus could be associated with higher risk from the  COVID-19 pandemic and for her to continue to socially distance and follow other CDC guidance. 2.   Due to the combination of physical impairments, cognitive impairments and fatigue, she is unable to work 3.    Continue Adderall XR 20 mg for cognitive issues and fatigue 4.    Gait issues with long walks likely unmasking of her chronic spinal cord/brainstem lesions by increasing core temperature.    Transient numbness in feet likely positional, may be related to her sciatica 5.    She will return for her infusion and follow-up with me for regular visit in about 6 months. She should call with any new or worsening neurologic symptoms.  40 minutes face-to-face evaluation with greater than half the time counseling coordinating care about her MS related symptoms and differences between pseudo-exacerbation and exacerbation.  Ruslan Mccabe A. Felecia Shelling, MD, PhD, FAAN Certified in Neurology, Clinical Neurophysiology, Sleep Medicine, Pain Medicine and Neuroimaging Director, [place] [facility] [address], [place], [place] [address] [phone]

## 2019-05-27 DIAGNOSIS — M25579 Pain in unspecified ankle and joints of unspecified foot: Secondary | ICD-10-CM | POA: Diagnosis not present

## 2019-05-31 ENCOUNTER — Telehealth: Payer: Self-pay | Admitting: Neurology

## 2019-05-31 DIAGNOSIS — S42144D Nondisplaced fracture of glenoid cavity of scapula, right shoulder, subsequent encounter for fracture with routine healing: Secondary | ICD-10-CM | POA: Diagnosis not present

## 2019-05-31 DIAGNOSIS — M25511 Pain in right shoulder: Secondary | ICD-10-CM | POA: Diagnosis not present

## 2019-05-31 NOTE — Telephone Encounter (Signed)
Pt has returned the call to [facility], she is asking for  A call back

## 2019-05-31 NOTE — Telephone Encounter (Signed)
I reached out to the pt and left a vm asking her to call me back. Dr. Felecia Shelling is currently out of the office and will not return until 06/07/19.

## 2019-05-31 NOTE — Telephone Encounter (Signed)
Patient calls and states she is having a hard time with her memory and her husband is getting concerned about it as well.

## 2019-05-31 NOTE — Telephone Encounter (Signed)
Pt returned my call. She states since 05/19/19 she has noticed changes within her short term memory. Pt states her husband has noticed this as well.  I advised the pt Dr. Felecia Shelling is currently out of the office and will return on 06/07/19 pt was agreeable with waiting until he returned for this message to be addressed. Pt wanted to know if further work up for memory should be addressed? She notices she is unable to remember daily tasks, looses focus and cannot remember what husband tells her through out the day.

## 2019-05-31 NOTE — Telephone Encounter (Signed)
I reached out to the pt and left another vm. Pt was provided with [facility]'s # and hours.

## 2019-06-03 ENCOUNTER — Telehealth: Payer: Self-pay | Admitting: Neurology

## 2019-06-03 MED ORDER — AMPHETAMINE-DEXTROAMPHET ER 20 MG PO CP24: 20.0000 mg | ORAL_CAPSULE | ORAL | 0 refills | Status: DC

## 2019-06-03 NOTE — Addendum Note (Signed)
Addended by: Kathrynn Ducking on: 06/03/2019 08:38 AM   Modules accepted: Orders

## 2019-06-03 NOTE — Telephone Encounter (Signed)
Adderall was refilled.

## 2019-06-03 NOTE — Telephone Encounter (Signed)
Pt has called for a refill on her amphetamine-dextroamphetamine (ADDERALL XR) 20 MG 24 hr capsule  [facility] [phone]

## 2019-06-03 NOTE — Telephone Encounter (Signed)
[place] drug registry verified. Last refill was 04/28/19 for a 30 day supply provided by Dr. Felecia Shelling.

## 2019-06-03 NOTE — Telephone Encounter (Signed)
Please let her know that memory problems are fairly common in MS and they do sometimes worsen the longer somebody has MS, even when on a disease modifying therapy  Adderall or other stimulants usually help the most.  At the next refill we could try a higher dose to see if she does better.  If it continues to worsen let me know and we will bring her in sooner for your next visit

## 2019-06-07 DIAGNOSIS — M25511 Pain in right shoulder: Secondary | ICD-10-CM | POA: Diagnosis not present

## 2019-06-07 NOTE — Telephone Encounter (Signed)
I reached out to the pt and left a vm asking pt to call me back.

## 2019-06-07 NOTE — Telephone Encounter (Signed)
I reached out to the pt and advised of Dr. Garth Bigness response.  Pt was agreeable at next Adderall  refill to try the higher dosage.

## 2019-06-07 NOTE — Telephone Encounter (Signed)
Pt returning call please call back °

## 2019-06-07 NOTE — Telephone Encounter (Signed)
Lvm asking the pt to call me back.

## 2019-06-28 ENCOUNTER — Ambulatory Visit
Admission: RE | Admit: 2019-06-28 | Discharge: 2019-06-28 | Disposition: A | Discharge status: Home / Self Care | Payer: BC Managed Care – PPO | Source: Ambulatory Visit | Attending: Family Medicine | Admitting: Family Medicine

## 2019-06-28 ENCOUNTER — Other Ambulatory Visit: Payer: Self-pay

## 2019-06-28 DIAGNOSIS — Z1231 Encounter for screening mammogram for malignant neoplasm of breast: Secondary | ICD-10-CM | POA: Diagnosis not present

## 2019-07-06 ENCOUNTER — Other Ambulatory Visit: Payer: Self-pay | Admitting: Neurology

## 2019-07-06 MED ORDER — AMPHETAMINE-DEXTROAMPHET ER 20 MG PO CP24: 20.0000 mg | ORAL_CAPSULE | ORAL | 0 refills | Status: DC

## 2019-07-06 NOTE — Telephone Encounter (Signed)
Patient requesting refill of Adderall. Best call back is [phone]

## 2019-07-22 ENCOUNTER — Ambulatory Visit: Payer: BLUE CROSS/BLUE SHIELD | Admitting: Neurology

## 2019-07-24 DIAGNOSIS — H5203 Hypermetropia, bilateral: Secondary | ICD-10-CM | POA: Diagnosis not present

## 2019-07-24 DIAGNOSIS — H524 Presbyopia: Secondary | ICD-10-CM | POA: Diagnosis not present

## 2019-07-24 DIAGNOSIS — H532 Diplopia: Secondary | ICD-10-CM | POA: Diagnosis not present

## 2019-07-24 DIAGNOSIS — H52223 Regular astigmatism, bilateral: Secondary | ICD-10-CM | POA: Diagnosis not present

## 2019-08-05 DIAGNOSIS — H2513 Age-related nuclear cataract, bilateral: Secondary | ICD-10-CM | POA: Diagnosis not present

## 2019-08-05 DIAGNOSIS — H25013 Cortical age-related cataract, bilateral: Secondary | ICD-10-CM | POA: Diagnosis not present

## 2019-08-05 DIAGNOSIS — H532 Diplopia: Secondary | ICD-10-CM | POA: Diagnosis not present

## 2019-08-05 DIAGNOSIS — G35 Multiple sclerosis: Secondary | ICD-10-CM | POA: Diagnosis not present

## 2019-08-09 ENCOUNTER — Telehealth: Payer: Self-pay | Admitting: Neurology

## 2019-08-09 NOTE — Telephone Encounter (Signed)
Pt called stating that she is having double vision and is not wanting to wait till the next available appt. Please advise.

## 2019-08-09 NOTE — Telephone Encounter (Signed)
Called and spoke with pt. Double vision started 3-4 weeks ago. At first, thought she was tired. Sx intermittent. She has been to see the eye doctor a couple times. Last episode being this am. Placed her on hold and relayed per Dr. Felecia Shelling that since sx have been going on the last 3-4 weeks he would not change treatment plan. If she has further episodes she should let us know. Pt preferred to come in for appt. Scheduled appt for 08/10/19 at 1:30pm, check in 1:00pm

## 2019-08-10 ENCOUNTER — Ambulatory Visit (INDEPENDENT_AMBULATORY_CARE_PROVIDER_SITE_OTHER): Payer: BC Managed Care – PPO | Admitting: Neurology

## 2019-08-10 ENCOUNTER — Other Ambulatory Visit: Payer: Self-pay

## 2019-08-10 ENCOUNTER — Encounter: Payer: Self-pay | Admitting: Neurology

## 2019-08-10 VITALS — BP 127/86 | HR 53 | Temp 97.5°F | Ht 66.0 in | Wt 165.5 lb

## 2019-08-10 DIAGNOSIS — F09 Unspecified mental disorder due to known physiological condition: Secondary | ICD-10-CM

## 2019-08-10 DIAGNOSIS — G35 Multiple sclerosis: Secondary | ICD-10-CM | POA: Diagnosis not present

## 2019-08-10 DIAGNOSIS — R200 Anesthesia of skin: Secondary | ICD-10-CM | POA: Diagnosis not present

## 2019-08-10 DIAGNOSIS — Z79899 Other long term (current) drug therapy: Secondary | ICD-10-CM | POA: Diagnosis not present

## 2019-08-10 DIAGNOSIS — R413 Other amnesia: Secondary | ICD-10-CM | POA: Diagnosis not present

## 2019-08-10 DIAGNOSIS — D693 Immune thrombocytopenic purpura: Secondary | ICD-10-CM

## 2019-08-10 DIAGNOSIS — H532 Diplopia: Secondary | ICD-10-CM

## 2019-08-10 HISTORY — DX: Diplopia: H53.2

## 2019-08-10 MED ORDER — AMPHETAMINE-DEXTROAMPHET ER 20 MG PO CP24: 20.0000 mg | ORAL_CAPSULE | ORAL | 0 refills | Status: DC

## 2019-08-10 NOTE — Progress Notes (Signed)
[facility]  PATIENT: Ashley Key DOB: 03/02/1966  REFERRING DOCTOR OR PCP:  London Pepper (PCP); Rachel Moulds (ADD) SOURCE: patient, notes from Dr. Orland Mustard, imaging/lab reports, MRI images on PACS  _________________________________   HISTORICAL  CHIEF COMPLAINT:  Chief Complaint  Patient presents with   Follow-up    RM 13, alone. Here to f/u on double vision that started 3-4 weeks ago. The last 18hr she has not had any double vision.     HISTORY OF PRESENT ILLNESS:  Ashley Key is a 53 y.o. woman with MS diagnosed 10/2017 .    Her main issues involve cognition with reduced ability to learn and focus  Update 08/10/2019: She had the onset of diplopia spells a week ago that lasted until yesterday.   She had only one spell yesterday and no diplopia today.   She noted more episodes of diplopia occurred later in the afternoon or evening.   Each spell would last 15-20 minutes.    She did not feel her balance was off more than usual.    She had another fall a couple months ago.    She does note that she will be a little more off balanced if she has a glass of wine.   No change in bladder function.     She has a pins and needles sensation in her left heel.    She saw ophthalmology but they did not find any ocular abnormality.  I reviewed their notes.  She continues to have fatigue and reduced focus and attention with other cognitive issues from her MS.  She notes some word finding issues at times.  Adderall helps some of these issues.  Since starting Ocrevus, the thrombocytopenia has resolved.  We discussed that Rituxan is sometimes used for ITP and Ocrevus at the same mechanism.  Update 05/19/2019: She is on Ocrevus and tolerates it well.   She walks a couple times a week with a friend on a flat surface.  After a 1/2 mile she has a noticeable limp, with some right leg weakness.   Gait is usually good most of the day.    She also notes right toe numbness (always 1st and  2nd and sometimes the rest).  Her heels also go numb, sometimes.   This usually occurs while sitting and she sometimes notes sciatic type pain, as well.    She has urinary urgency.     No issues with vision.   She notes mild decreased cognition with reduced verbal fluency and processing speed.   Fatigue is worse with heat.    Adderall helps the cognition and fatigue.  She is sleeping ok most nights.   She rarely takes sertraline at night (2/month).    Mood is ok.   She has sertraline but rarely takes.     Update 01/21/2019: She has had a couple Ocrevus infusions and has tolerated them well.  She denies any exacerbations.  Her next infusion will be in March.  She feels she is doing about the same neurologically.  Specifically, her gait is a little off balance but she walks without a cane.  She is able to walk at least a mile without stopping.  She has some numbness in the feet, right greater than left.  This improved for a while but came back a different way (now more in digit 45 rather than 2/3 in past).   There is no significant weakness or spasticity.  Visual acuity is okay.  She  does have diplopia now and then, especially when tired in the evening.  She has occasional mild urge incontinence..  She has mild neurocognitive deficits due to the MS and also has attention deficit disorder.  She does better with Adderall.  She tolerates it well.  She has mild depression and is frustrated by the cognitive issues   She is taking sertraline but not regularly.  She is still apathetic.   She is sleeping better.   Her son is now in a boarding school  Update 07/21/2018: She started the Ocrevus and had the 2 halves of the first dose in March and will have her next infusion.  Her second dose was followed by 3-4 days of increased fatigue but no rashes.    She denies any weakness.  She does have some numbness especially in the right leg.  Her gait is slightly off balance at times.   She is not walking as well as before MS  but can walk a mile.    She has mild diplopia at times.  She has had neurocognitive testing and has mild neurocognitive deficit due to MS (I reviewed these in her presence).  She also has ADHD.  She had most difficulties with verbal learning, memory and slow processing speed.   She is noting verbal fluency issues.   Symptomatically, she feels she does better with the Adderall.  She has tolerated well.   She is noting mild depression some days.    Her son has ADHD/OCD and is challenging.    She is on Zoloft and feels it helps her a little bit.       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 Based on my clinical interview with the patient and her history, I concur with the diagnostic impressions described in her previous neuropsychological evaluation by Dr. Norton Pastel (dated 05/19/2018). Specifically, it appears that the patient is experiencing mild neurocognitive disorder due to MS and preexisting ADHD.  Psychological testing did not reveal any indication of additional psychiatric component.  I also concur with the recommendations provided by Dr. Vikki Ports in his report.  SUMMARY & IMPRESSION (by Norton Pastel, PhD):  Mrs. Shuree Key is a 53 year old, right-handed, Caucasian female, who reported experiencing cognitive deficits that began approximately 2 years ago. This is in the context of recently diagnosed relapsing-remitting multiple sclerosis.  Test results revealed intact functioning across most cognitive domains and thinking skills assessed during this evaluation with the exception of reduced aspects of verbal learning and memory as well as mildly slowed processing speed. From an emotional standpoint, there appears to be at least a moderate degree of recent depression and mild-to-moderate anxiety.  Ashley Key's performance is consistent with a diagnosis of Mild Neurocognitive Disorder (i.e., mild cognitive impairment). With regard to  etiology, these results are likely due to the cognitive consequences of multiple sclerosis and mood disruption.   Also of note, she has ITP and the decision to use Ocrevus was discussed with her hematologist.  Update 01/13/2018:    At the last visit we had discussed treatment options and she opted to go on ocrelizumab. She has not yet received her first infusion. We will check with the infusion service.   Her gait is doing the same.    Strength is stable.     She has more numbness.   She has pins and needles numbness in trhe right foot that started a few weeks ago --- she has older  numbness in just the second toe x years.    She is noting wrist pain on her right.      She is noting occasional mild diplopia but most of the time vision is stable. She has urinary urgency with rare incontinence.   She notes fatigue on a daily basis. This is mildly helped by Adderall.    She has mild cognitive issues (STM, word finding) and decreased focus/attention.    She notes some depression and frustration associated with worery about not working and cognitive issues.       Update 10/23/2017:   I reviewed the recent MRI of the brain and cervical spine with Ms. Parady. The MRI of the cervical spine shows a focus in the mid spinal cord at the cervicomedullary junction. The MRI of the brain with contrast (previously had without) showed 1 subtle small enhancing focus. The combination of her symptoms and MRIs are consistent with multiple sclerosis and we discussed therapeutic options..  We discussed treatment options. As she has potential for a more progressive MS (has a spinal plaque and older symptoms were her first evidence of disease) and she has ITP, ocrelizumab, an anti-CD20 agent similar to Rituxan, maybe an excellent choice for her MS and also help the ITP.   Alternatively, Tecfidera is a reasonable agent to begin with with MS  Her hematologist for her ITP is Dr. Benay Spice.     From 10/15/2017: Cindylou Pagan is a  53 year old woman who began to note difficulties with her memory 18 months ago when she noted difficuty with focus and attention.   She noted more difficulties with her work.   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    Also, last year, she went to [facility] (Dr. Johnnye Sima) but she was felt not to have ADD after testing.   Adderall did not help her much.     She feels her memory deficits are mostly stable this year and her main worsening occurred in 2017.    She is currently not working and feels less stressed but also mildly depressed as she is not working.   She is doing puzzles and other activities to try to keep her mind stimulated.   She was placed on sertraline 3 years ago when mood initially worsened. She feels her some benefit from it.   She feels gait is usually good.    She is able to climb stairs and a step stool.    She fell in her tiered garden when she fell down the 2 foot drop and broke her foot.   She had one or 2 other milder falls this year.   She does not note any difficulties with strength or sensation.  She has had bladder frequency and urge incontinence (drips) at times that has worsened the past 2 years and even more so over the past 6 months.   She feels vision is worse but she improved with her new glasses.  She denies color vision changes.   She notes more fatigue and more apathy.   Adderall did not help much.  She has insomnia with more trouble staying asleep than falling asleep.  She often wakes up at 2-4 am and then can't fall back asleep.  Her mind is often racing and she gets out of bed.    She denies headaches, rashes or joint pain.  She had ITP 06/2014 with a platelet count of 12.  She was treated with steroids and improved (Dr. Benay Spice) [name] there was persistent thrombocytopenia (last plt count = 79 and mild  lymphopenia/leukopenia.  She also has had low level positive ANA.   She was diagnosed with hyperthyroidism 2 years ago and had radioactive treatment.   She is on Synthroid (112 mcg) and Dr. Orland Mustard is checking her labs.        I personally reviewed the MRI of the brain performed 10/10/2017. It is abnormal showing multiple T2/FLAIR hyperintense foci in the juxtacortical, periventricular and deep white matter. Additionally there is a focus in the left thalamus, right midbrain, right pons and a subtle focus in the right cerebellar hemisphere and possibly at the cervicomedullary junction.  One of the juxtacortical foci on the right is mildly hyperintense on diffusion-weighted images and may be more acute or be artifact.     Montreal Cognitive Assessment  10/15/2017  Visuospatial/ Executive (0/5) 5  Naming (0/3) 3  Attention: Read list of digits (0/2) 2  Attention: Read list of letters (0/1) 1  Attention: Serial 7 subtraction starting at 100 (0/3) 3  Language: Repeat phrase (0/2) 2  Language : Fluency (0/1) 1  Abstraction (0/2) 2  Delayed Recall (0/5) 0  Orientation (0/6) 6  Total 25  Adjusted Score (based on education) 25     REVIEW OF SYSTEMS: Constitutional: No fevers, chills, sweats, or change in appetite.   She has fatigue.   Mild insomnia.    Eyes: No visual changes, double vision, eye pain Ear, nose and throat: No hearing loss, ear pain, nasal congestion, sore throat Cardiovascular: No chest pain, palpitations Respiratory: No shortness of breath at rest or with exertion.   No wheezes GastrointestinaI: No nausea, vomiting, diarrhea, abdominal pain, fecal incontinence Genitourinary: Urinary urgency and nocturia. Musculoskeletal: No neck pain, back pain Integumentary: No rash, pruritus, skin lesions Neurological: as above Psychiatric: mild depression Endocrine: No palpitations, diaphoresis, change in appetite, change in weigh or increased thirst Hematologic/Lymphatic: No anemia,  purpura, petechiae. Allergic/Immunologic: No itchy/runny eyes, nasal congestion, recent allergic reactions, rashes  ALLERGIES: No Known Allergies  HOME MEDICATIONS:  Current Outpatient Medications:    amLODipine (NORVASC) 5 MG tablet, Take by mouth daily., Disp: , Rfl: 2   CALCIUM-VITAMIN D PO, Take 1 tablet by mouth daily., Disp: , Rfl:    COMBIPATCH 0.05-0.14 MG/DAY, , Disp: , Rfl:    Multiple Vitamins-Minerals (MULTIVITAMIN PO), Take 1 tablet by mouth daily., Disp: , Rfl:    ocrelizumab 600 mg in sodium chloride 0.9 % 500 mL, Inject 600 mg into the vein every 6 (six) months. , Disp: , Rfl:    Omega-3 Fatty Acids (FISH OIL) 1200 MG CAPS, Take 1,200 mg by mouth daily., Disp: , Rfl:    rosuvastatin (CRESTOR) 5 MG tablet, TAKE 1 TABLET(5 MG) BY MOUTH DAILY, Disp: 30 tablet, Rfl: 0   sertraline (ZOLOFT) 100 MG tablet, Take 150 mg by mouth daily. , Disp: , Rfl: 0   SYNTHROID 112 MCG tablet, TAKE 1 TABLET BY MOUTH ON AN EMPTY STOMACH 30 MINUTES BEFORE BREAKFAST, Disp: , Rfl: 5   amphetamine-dextroamphetamine (ADDERALL XR) 20 MG 24 hr capsule, Take 1 capsule (20 mg total) by mouth every morning., Disp: 30 capsule, Rfl: 0 No current facility-administered medications for this visit.   Facility-Administered Medications Ordered in Other Visits:    gadopentetate dimeglumine (MAGNEVIST) injection 15 mL, 15 mL, Intravenous, Once PRN, Loghan Subia, Nanine Means, MD  PAST  MEDICAL HISTORY: Past Medical History:  Diagnosis Date   ADHD (attention deficit hyperactivity disorder)    Arch pain    Arthritis    FEET   Blood dyscrasia    NORMAL PLATLETS 50-70'S   Bunion    Graves disease    Headache    HISTORY MIGRAINE   Hypertension    ITP (idiopathic thrombocytopenic purpura)    Pelvic fracture (HCC)    Pneumonia     PAST SURGICAL HISTORY: Past Surgical History:  Procedure Laterality Date   DILITATION & CURRETTAGE/HYSTROSCOPY WITH HYDROTHERMAL ABLATION N/A 09/27/2015    Procedure: DILATATION & CURETTAGE/HYSTEROSCOPY WITH HYDROTHERMAL ABLATION;  Surgeon: Thurnell Lose, MD;  Location: [facility];  Service: Gynecology;  Laterality: N/A;  ultrasound guidance    FOOT SURGERY     THYROID RADATION 2016     WISDOM TOOTH EXTRACTION      FAMILY HISTORY: Family History  Problem Relation Age of Onset   CAD Mother    Heart attack Mother    Stroke Father    Heart attack Father    CAD Brother    Heart attack Brother    Cancer Brother        prostate cancer   Heart attack Maternal Grandmother    Stroke Maternal Grandmother    Stroke Paternal Grandmother     SOCIAL HISTORY:  Social History   Socioeconomic History   Marital status: Married    Spouse name: Collier Salina   Number of children: 1   Years of education: Not on file   Highest education level: Not on file  Occupational History   Occupation: Clinical research associate: [employer] resource strain: Not on file   Food insecurity    Worry: Not on file    Inability: Not on file   Transportation needs    Medical: Not on file    Non-medical: Not on file  Tobacco Use   Smoking status: Never Smoker   Smokeless tobacco: Never Used  Substance and Sexual Activity   Alcohol use: Yes    Comment: social   Drug use: No   Sexual activity: Yes  Lifestyle   Physical activity    Days per week: Not on file    Minutes per session: Not on file   Stress: Not on file  Relationships   Social connections    Talks on phone: Not on file    Gets together: Not on file    Attends religious service: Not on file    Active member of club or organization: Not on file    Attends meetings of clubs or organizations: Not on file    Relationship status: Not on file   Intimate partner violence    Fear of current or ex partner: Not on file    Emotionally abused: Not on file    Physically abused: Not on file    Forced sexual activity: Not on file  Other Topics  Concern   Not on file  Social History Narrative   Married, husband Music therapist in [place]- #1 son (97)   Pet cat and getting new dog today            Epworth Sleepiness Scale = 9 (as of 01/22/2016)     PHYSICAL EXAM  Vitals:   08/10/19 1327  BP: 127/86  Pulse: (!) 53  Temp: (!) 97.5 F (36.4 C)  Weight: 165 lb 8 oz (  75.1 kg)  Height: 5\' 6"  (1.676 m)    Body mass index is 26.71 kg/m.   General: The patient is well-developed and well-nourished and in no acute distress   Neurologic Exam  Mental status: The patient is alert and oriented x 3 at the time of the examination. Short-term recall and attention are reduced.  Speech is normal.  Cranial nerves: Extraocular movements are full. Facial strength and sensation is normal.  The tongue is midline, and the patient has symmetric elevation of the soft palate. No obvious hearing deficits are noted.  Motor:  Muscle bulk is normal.   Muscle tone is normal.. Strength is  5 / 5 in all 4 extremities.   Sensory:   She has reduced sensation to touch and vibration in the right leg  Coordination: There is good finger-nose-finger and mildly reduced heel-to-shin, right worse than left.  Gait and station: Station is normal.   Gait is mildly wide.  Tandem gait is mildly wide.  Romberg is negative. Reflexes: Deep tendon reflexes are symmetric and 2 in arms and 3 in legs bilaterally with spread at the knees.  There is no ankle clonus.    DIAGNOSTIC DATA (LABS, IMAGING, TESTING) - I reviewed patient records, labs, notes, testing and imaging myself where available.  Lab Results  Component Value Date   WBC 3.5 01/21/2019   HGB 14.4 01/21/2019   HCT 42.1 01/21/2019   MCV 88 01/21/2019   PLT 197 01/21/2019      Component Value Date/Time   NA 136 12/10/2015 0930   K 4.4 12/10/2015 0930   CL 107 12/10/2015 0930   CO2 22 12/10/2015 0930   GLUCOSE 94 12/10/2015 0930   BUN 21 (H) 12/10/2015 0930    CREATININE 0.66 12/10/2015 0930   CALCIUM 8.6 (L) 12/10/2015 0930   PROT 7.2 10/23/2017 1234   ALBUMIN 4.5 10/23/2017 1234   AST 17 10/23/2017 1234   ALT 11 10/23/2017 1234   ALKPHOS 57 10/23/2017 1234   BILITOT 0.2 10/23/2017 1234   GFRNONAA >60 12/10/2015 0930   GFRAA >60 12/10/2015 0930   Lab Results  Component Value Date   CHOL 224 (H) 01/22/2016   HDL 72 01/22/2016   LDLCALC 139 (H) 01/22/2016   TRIG 64 01/22/2016   CHOLHDL 3.1 01/22/2016   No results found for: HGBA1C Lab Results  Component Value Date   VITAMINB12 >2,000 (H) 06/28/2014   Lab Results  Component Value Date   TSH 1.740 10/15/2017       ASSESSMENT AND PLAN  Multiple sclerosis ([facility]) - Plan: MR BRAIN W WO CONTRAST, CBC with Differential/Platelet, Hepatic function panel  Numbness  Memory loss  High risk medication use  Cognitive deficit secondary to multiple sclerosis (HCC)  Diplopia   1.   Etiology of the diplopia is unclear.  Symptoms were intermittent so this may not represent an MS exacerbation.  She is doing better today than a couple days ago.  We will check an MRI of the brain to better evaluate the posterior fossa.  She will continue Ocrevus for the time being but if there is evidence of breakthrough activity we would need to consider a different disease modifying therapy..  We will check some lab work today 2.   Due to the combination of physical impairments, cognitive impairments and fatigue, she is unable to work 3.    Continue Adderall XR 20 mg for cognitive issues and fatigue 4.   Return to see me in 5 months or  sooner with any new or worsening neurologic symptoms.   Alyene Predmore A. Felecia Shelling, MD, PhD, FAAN Certified in Neurology, Clinical Neurophysiology, Sleep Medicine, Pain Medicine and Neuroimaging Director, [place] [facility] [address], [place], [place] [address] [phone]

## 2019-08-11 ENCOUNTER — Telehealth: Payer: Self-pay | Admitting: *Deleted

## 2019-08-11 LAB — CBC WITH DIFFERENTIAL/PLATELET
Basophils Absolute: 0 x10E3/uL (ref 0.0–0.2)
Basos: 1 %
EOS (ABSOLUTE): 0.1 x10E3/uL (ref 0.0–0.4)
Eos: 3 %
Hematocrit: 43.1 % (ref 34.0–46.6)
Hemoglobin: 14.3 g/dL (ref 11.1–15.9)
Immature Grans (Abs): 0 x10E3/uL (ref 0.0–0.1)
Immature Granulocytes: 0 %
Lymphocytes Absolute: 1 x10E3/uL (ref 0.7–3.1)
Lymphs: 24 %
MCH: 29.9 pg (ref 26.6–33.0)
MCHC: 33.2 g/dL (ref 31.5–35.7)
MCV: 90 fL (ref 79–97)
Monocytes Absolute: 0.5 x10E3/uL (ref 0.1–0.9)
Monocytes: 11 %
Neutrophils Absolute: 2.6 x10E3/uL (ref 1.4–7.0)
Neutrophils: 61 %
Platelets: 185 x10E3/uL (ref 150–450)
RBC: 4.78 x10E6/uL (ref 3.77–5.28)
RDW: 12 % (ref 11.7–15.4)
WBC: 4.2 x10E3/uL (ref 3.4–10.8)

## 2019-08-11 LAB — HEPATIC FUNCTION PANEL
ALT: 11 IU/L (ref 0–32)
AST: 15 IU/L (ref 0–40)
Albumin: 4.7 g/dL (ref 3.8–4.9)
Alkaline Phosphatase: 58 IU/L (ref 39–117)
Bilirubin Total: <0.2 mg/dL (ref 0.0–1.2)
Bilirubin, Direct: 0.09 mg/dL (ref 0.00–0.40)
Total Protein: 6.7 g/dL (ref 6.0–8.5)

## 2019-08-11 NOTE — Telephone Encounter (Signed)
Called and spoke with pt about results. Pt verbalized understanding.  

## 2019-08-11 NOTE — Telephone Encounter (Signed)
-----   Message from Britt Bottom, MD sent at 08/11/2019 12:20 PM EDT ----- Please let the patient know that the lab work is fine.

## 2019-08-12 ENCOUNTER — Telehealth: Payer: Self-pay | Admitting: Neurology

## 2019-08-12 NOTE — Telephone Encounter (Signed)
I reached out to the benefits department who stated that the patients benefits were as follows;  Deductible- 3,000 met Out of Pocket-7,000 met Coinsurance-20 Ref#[id number]. DW

## 2019-08-12 NOTE — Telephone Encounter (Signed)
I called and started an auth with the patients insurance for the MRI Brain w/wo. It was approved [id number] (09/10/19). DW

## 2019-08-17 ENCOUNTER — Telehealth: Payer: Self-pay

## 2019-08-17 ENCOUNTER — Ambulatory Visit (INDEPENDENT_AMBULATORY_CARE_PROVIDER_SITE_OTHER): Payer: BC Managed Care – PPO

## 2019-08-17 ENCOUNTER — Other Ambulatory Visit: Payer: Self-pay

## 2019-08-17 DIAGNOSIS — G35 Multiple sclerosis: Secondary | ICD-10-CM

## 2019-08-17 MED ORDER — GADOBENATE DIMEGLUMINE 529 MG/ML IV SOLN
15.0000 mL | Freq: Once | INTRAVENOUS | Status: AC | PRN
  Administered 2019-08-17: 15 mL via INTRAVENOUS

## 2019-08-17 NOTE — Telephone Encounter (Signed)
Patient mentioned that she would a copy of her MRI results sent to [facility] with the attention to [place] once they have resulted.

## 2019-08-19 ENCOUNTER — Telehealth: Payer: Self-pay | Admitting: Neurology

## 2019-08-19 ENCOUNTER — Telehealth: Payer: Self-pay | Admitting: *Deleted

## 2019-08-19 DIAGNOSIS — G35 Multiple sclerosis: Secondary | ICD-10-CM | POA: Diagnosis not present

## 2019-08-19 NOTE — Telephone Encounter (Signed)
Liane, RN with intrafusion reported that pt in office today for infusion. Pt given appt prior to leaving office but she ended up forgetting and asked front desk about making appt.

## 2019-08-19 NOTE — Telephone Encounter (Signed)
I called the patient.  MRI of the brain shows no changes from February 2020.  The patient reports some double vision when she is tired predominantly, she does have midbrain lesions, this could explain some of the double vision issues.  She does not have double vision throughout the day.   MRI brain 08/19/19:  IMPRESSION:   MRI brain (with and without) demonstrating: - Mild periventricular and subcortical and midbrain chronic demyelinating plaques. - No acute findings. - No change from MRI on 02/02/19.

## 2019-09-06 ENCOUNTER — Other Ambulatory Visit: Payer: Self-pay | Admitting: Neurology

## 2019-09-06 MED ORDER — AMPHETAMINE-DEXTROAMPHET ER 20 MG PO CP24: 20.0000 mg | ORAL_CAPSULE | ORAL | 0 refills | Status: DC

## 2019-09-06 NOTE — Addendum Note (Signed)
Addended by: Hope Pigeon on: 09/06/2019 02:10 PM   Modules accepted: Orders

## 2019-09-06 NOTE — Telephone Encounter (Signed)
Pt has called for a refill on her amphetamine-dextroamphetamine (ADDERALL XR) 20 MG 24 hr capsule ° °[id number]  °

## 2019-09-07 ENCOUNTER — Telehealth: Payer: Self-pay | Admitting: Neurology

## 2019-09-07 NOTE — Telephone Encounter (Signed)
Pt is needing a refill on her amphetamine-dextroamphetamine (ADDERALL XR) 20 MG 24 hr capsule but is wanting it before 10/1 because she will not be able to take her morning one if she waits to pick it up on 10/1. Pt wants RN to call the pharmacy to order her a pill or two for her to be covered until she picks it up. Please advise.

## 2019-09-07 NOTE — Telephone Encounter (Signed)
Called pharmacy and authorized pt to pick up refill today. Ok per Dr. Felecia Shelling. They will get rx ready for pt.

## 2019-09-07 NOTE — Telephone Encounter (Signed)
Called pt and relayed below information. She verbalized understanding and appreciation. She has 1 tablet left of old rx to take for tomorrow morning.

## 2019-09-08 ENCOUNTER — Other Ambulatory Visit: Payer: Self-pay | Admitting: Family Medicine

## 2019-09-08 ENCOUNTER — Ambulatory Visit
Admission: RE | Admit: 2019-09-08 | Discharge: 2019-09-08 | Disposition: A | Discharge status: Home / Self Care | Payer: BC Managed Care – PPO | Source: Ambulatory Visit | Attending: Family Medicine | Admitting: Family Medicine

## 2019-09-08 DIAGNOSIS — R0789 Other chest pain: Secondary | ICD-10-CM

## 2019-09-08 DIAGNOSIS — F430 Acute stress reaction: Secondary | ICD-10-CM | POA: Diagnosis not present

## 2019-09-08 DIAGNOSIS — R079 Chest pain, unspecified: Secondary | ICD-10-CM | POA: Diagnosis not present

## 2019-09-08 DIAGNOSIS — M549 Dorsalgia, unspecified: Secondary | ICD-10-CM | POA: Diagnosis not present

## 2019-09-11 ENCOUNTER — Other Ambulatory Visit: Payer: Self-pay

## 2019-09-11 ENCOUNTER — Emergency Department (HOSPITAL_COMMUNITY)
Admission: EM | Admit: 2019-09-11 | Discharge: 2019-09-11 | Disposition: A | Discharge status: Home / Self Care | Payer: BC Managed Care – PPO | Attending: Emergency Medicine | Admitting: Emergency Medicine

## 2019-09-11 ENCOUNTER — Encounter (HOSPITAL_COMMUNITY): Payer: Self-pay

## 2019-09-11 DIAGNOSIS — Z79899 Other long term (current) drug therapy: Secondary | ICD-10-CM | POA: Insufficient documentation

## 2019-09-11 DIAGNOSIS — I10 Essential (primary) hypertension: Secondary | ICD-10-CM | POA: Insufficient documentation

## 2019-09-11 DIAGNOSIS — E039 Hypothyroidism, unspecified: Secondary | ICD-10-CM | POA: Diagnosis not present

## 2019-09-11 DIAGNOSIS — R0789 Other chest pain: Secondary | ICD-10-CM | POA: Insufficient documentation

## 2019-09-11 DIAGNOSIS — Z20828 Contact with and (suspected) exposure to other viral communicable diseases: Secondary | ICD-10-CM | POA: Insufficient documentation

## 2019-09-11 LAB — CBC
HCT: 41.7 % (ref 36.0–46.0)
Hemoglobin: 13.5 g/dL (ref 12.0–15.0)
MCH: 29.7 pg (ref 26.0–34.0)
MCHC: 32.4 g/dL (ref 30.0–36.0)
MCV: 91.6 fL (ref 80.0–100.0)
Platelets: 177 10*3/uL (ref 150–400)
RBC: 4.55 MIL/uL (ref 3.87–5.11)
RDW: 12.1 % (ref 11.5–15.5)
WBC: 4.4 10*3/uL (ref 4.0–10.5)
nRBC: 0 % (ref 0.0–0.2)

## 2019-09-11 LAB — BASIC METABOLIC PANEL
Anion gap: 9 (ref 5–15)
BUN: 19 mg/dL (ref 6–20)
CO2: 24 mmol/L (ref 22–32)
Calcium: 9 mg/dL (ref 8.9–10.3)
Chloride: 105 mmol/L (ref 98–111)
Creatinine, Ser: 0.82 mg/dL (ref 0.44–1.00)
GFR calc Af Amer: >60 mL/min (ref >60)
GFR calc non Af Amer: >60 mL/min (ref >60)
Glucose, Bld: 97 mg/dL (ref 70–99)
Potassium: 3.9 mmol/L (ref 3.5–5.1)
Sodium: 138 mmol/L (ref 135–145)

## 2019-09-11 LAB — TROPONIN I (HIGH SENSITIVITY)
Troponin I (High Sensitivity): 6 ng/L (ref <18)
Troponin I (High Sensitivity): 8 ng/L (ref <18)

## 2019-09-11 LAB — D-DIMER, QUANTITATIVE: D-Dimer, Quant: <0.27 ug/mL-FEU (ref 0.00–0.50)

## 2019-09-11 MED ORDER — DICLOFENAC SODIUM 1 % TD GEL
2.0000 g | Freq: Four times a day (QID) | TRANSDERMAL | Status: DC | PRN
  Administered 2019-09-11: 2 g via TOPICAL
  Filled 2019-09-11: qty 100

## 2019-09-11 MED ORDER — SODIUM CHLORIDE 0.9% FLUSH: 3.0000 mL | Freq: Once | INTRAVENOUS | Status: DC

## 2019-09-11 NOTE — ED Provider Notes (Signed)
[facility] DEPT Provider Note   CSN: [id number] Arrival date & time: 09/11/19  0304     History   Chief Complaint Chief Complaint  Patient presents with  . Chest Pain    HPI Ashley Key is a 53 y.o. female.     The history is provided by the patient and medical records. No language interpreter was used.  Chest Pain  Ashley Key is a 53 y.o. female who presents to the Emergency Department complaining of chest pain. She presents to the emergency department complaining of 3 to 4 days of left sided chest pain. Pain is located in the left chest, left posterior back. Is described as an aching sensation and it is waxing and waning. It is worse with deep breaths as well as laying supine. She was unable to sleep last night secondary to the pain. She has minimal cough. She denies any fevers, diaphoresis, nausea, vomiting, abdominal pain, leg swelling or pain. She has a history of MS. she does take estrogen supplementation. She is a non-smoker, no alcohol, no drugs. She has a family history of coronary artery disease and her brother at the age of 57. She saw her PCP a few days ago for similar symptoms and had a negative chest x-ray performed at that time. No known COVID 19 exposures.  No change in taste/smell.  Past Medical History:  Diagnosis Date  . ADHD (attention deficit hyperactivity disorder)   . Arch pain   . Arthritis    FEET  . Blood dyscrasia    NORMAL PLATLETS 50-70'S  . Bunion   . Graves disease   . Headache    HISTORY MIGRAINE  . Hypertension   . ITP (idiopathic thrombocytopenic purpura)   . Pelvic fracture ([place])   . Pneumonia     Patient Active Problem List   Diagnosis Date Noted  . Diplopia 08/10/2019  . Cognitive deficit secondary to multiple sclerosis ([place]) 01/13/2018  . Multiple sclerosis ([place]) 10/23/2017  . High risk medication use 10/23/2017  . White matter abnormality on MRI of brain 10/15/2017  . Numbness 10/15/2017  .  Memory loss 10/15/2017  . Ataxic gait 10/15/2017  . Attention deficit 10/15/2017  . Chest pain, unspecified 01/18/2016  . Screening, lipid 01/18/2016  . Essential hypertension 01/18/2016  . Hypothyroidism 01/18/2016  . Idiopathic thrombocytopenic purpura ([place]) 06/27/2014  . Thrombocytopenia ([place]) 06/27/2014    Past Surgical History:  Procedure Laterality Date  . DILITATION & CURRETTAGE/HYSTROSCOPY WITH HYDROTHERMAL ABLATION N/A 09/27/2015   Procedure: DILATATION & CURETTAGE/HYSTEROSCOPY WITH HYDROTHERMAL ABLATION;  Surgeon: Thurnell Lose, MD;  Location: [facility];  Service: Gynecology;  Laterality: N/A;  ultrasound guidance   . FOOT SURGERY    . THYROID RADATION 2016    . WISDOM TOOTH EXTRACTION       OB History   No obstetric history on file.      Home Medications    Prior to Admission medications   Medication Sig Start Date End Date Taking? Authorizing Provider  amLODipine (NORVASC) 5 MG tablet Take 5 mg by mouth daily.  12/01/15  Yes [provider]  amphetamine-dextroamphetamine (ADDERALL XR) 20 MG 24 hr capsule Take 1 capsule (20 mg total) by mouth every morning. 09/06/19  Yes [name], Nanine Means, MD  CALCIUM-VITAMIN D PO Take 1 tablet by mouth daily.   Yes [provider]  COMBIPATCH 0.05-0.14 MG/DAY Place 1 patch onto the skin 2 (two) times a week. Change on Sunday and Thursday 04/21/16  Yes [provider]  Multiple Vitamins-Minerals (MULTIVITAMIN PO) Take 1 tablet by mouth daily.   Yes [provider]  naproxen sodium (ALEVE) 220 MG tablet Take 440 mg by mouth 2 (two) times daily as needed (pain).   Yes [provider]  ocrelizumab 600 mg in sodium chloride 0.9 % 500 mL Inject 600 mg into the vein every 6 (six) months.    Yes [provider]  Omega-3 Fatty Acids (FISH OIL) 1200 MG CAPS Take 1,200 mg by mouth daily.   Yes [provider]  rosuvastatin (CRESTOR) 5 MG tablet TAKE 1 TABLET(5 MG) BY MOUTH DAILY Patient  taking differently: Take 5 mg by mouth daily.  06/12/17  Yes Hilty, Nadean Corwin, MD  sertraline (ZOLOFT) 100 MG tablet Take 150 mg by mouth at bedtime.  09/10/17  Yes [provider]  SYNTHROID 112 MCG tablet Take 112 mcg by mouth daily before breakfast.  04/02/16  Yes [provider]  TURMERIC PO Take 1 tablet by mouth daily.   Yes [provider]    Family History Family History  Problem Relation Age of Onset  . CAD Mother   . Heart attack Mother   . Stroke Father   . Heart attack Father   . CAD Brother   . Heart attack Brother   . Cancer Brother        prostate cancer  . Heart attack Maternal Grandmother   . Stroke Maternal Grandmother   . Stroke Paternal Grandmother     Social History Social History   Tobacco Use  . Smoking status: Never Smoker  . Smokeless tobacco: Never Used  Substance Use Topics  . Alcohol use: Yes    Comment: social  . Drug use: No     Allergies   Patient has no known allergies.   Review of Systems Review of Systems  Cardiovascular: Positive for chest pain.  All other systems reviewed and are negative.    Physical Exam Updated Vital Signs BP 127/85   Pulse 74   Temp 98.3 F (36.8 C) (Oral)   Resp 19   SpO2 100%   Physical Exam Vitals signs and nursing note reviewed.  Constitutional:      Appearance: She is well-developed.  HENT:     Head: Normocephalic and atraumatic.  Cardiovascular:     Rate and Rhythm: Normal rate and regular rhythm.     Heart sounds: No murmur.  Pulmonary:     Effort: Pulmonary effort is normal. No respiratory distress.     Breath sounds: Normal breath sounds.  Chest:     Chest wall: No tenderness.  Abdominal:     Palpations: Abdomen is soft.     Tenderness: There is no abdominal tenderness. There is no guarding or rebound.  Musculoskeletal:        General: No swelling or tenderness.  Skin:    General: Skin is warm and dry.  Neurological:     Mental Status: She is alert and  oriented to person, place, and time.  Psychiatric:        Mood and Affect: Mood normal.        Behavior: Behavior normal.      ED Treatments / Results  Labs (all labs ordered are listed, but only abnormal results are displayed) Labs Reviewed  NOVEL CORONAVIRUS, NAA (HOSP ORDER, SEND-OUT TO REF LAB; TAT 18-24 HRS)  BASIC METABOLIC PANEL  CBC  D-DIMER, QUANTITATIVE (NOT AT [facility])  TROPONIN I (HIGH SENSITIVITY)  TROPONIN I (HIGH SENSITIVITY)    EKG EKG Interpretation  Date/Time:  Saturday September 11 2019 03:20:23 EDT Ventricular Rate:  71 PR Interval:    QRS Duration: 83 QT Interval:  408 QTC Calculation: 444 R Axis:   62 Text Interpretation:  Sinus rhythm Anteroseptal infarct, age indeterminate No significant change since last tracing Confirmed by Quintella Reichert [phone]) on 09/11/2019 7:00:15 AM   Radiology No results found.  Procedures Procedures (including critical care time)  Medications Ordered in ED Medications  sodium chloride flush (NS) 0.9 % injection 3 mL (0 mLs Intravenous Hold 09/11/19 0412)  diclofenac sodium (VOLTAREN) 1 % transdermal gel 2 g (has no administration in time range)     Initial Impression / Assessment and Plan / ED Course  I have reviewed the triage vital signs and the nursing notes.  Pertinent labs & imaging results that were available during my care of the patient were reviewed by me and considered in my medical decision making (see chart for details).        Patient here for evaluation of 3 to 4 days of left sided chest and back pain. Pain is reproducible on laying supine and improved on palpation on examination. EKG without acute ischemic changes. She is low risk for ACS. Doubt PE. Patient declines chest x-ray, had outpatient x-ray performed three days ago. Images were reviewed, no evidence of pneumonia. Patient is requesting COVID-19 swab. She states that her 3 year old son was complaining of headaches earlier in the week.  D/w pt  likely MSK chest pain.  Will send screening COVID swab.  Discussed outpatient follow up and return precautions.    Final Clinical Impressions(s) / ED Diagnoses   Final diagnoses:  Atypical chest pain    ED Discharge Orders    None       Quintella Reichert, MD 09/11/19 [phone]

## 2019-09-11 NOTE — ED Triage Notes (Signed)
Pt reports L sided chest pain that radiates straight through to her back. States that it started about 4 days ago, but has worsened. Also endorses increased fatigue. States that laying down makes the chest pain worse. She denies fever or SOB, but endorses some "light coughing." Denies N/V/D. Hx of MS.

## 2019-09-13 LAB — NOVEL CORONAVIRUS, NAA (HOSP ORDER, SEND-OUT TO REF LAB; TAT 18-24 HRS): SARS-CoV-2, NAA: NOT DETECTED

## 2019-09-23 DIAGNOSIS — F411 Generalized anxiety disorder: Secondary | ICD-10-CM | POA: Diagnosis not present

## 2019-09-28 DIAGNOSIS — G35 Multiple sclerosis: Secondary | ICD-10-CM | POA: Diagnosis not present

## 2019-09-28 DIAGNOSIS — H532 Diplopia: Secondary | ICD-10-CM | POA: Diagnosis not present

## 2019-10-07 ENCOUNTER — Encounter: Payer: Self-pay | Admitting: Cardiology

## 2019-10-07 ENCOUNTER — Ambulatory Visit (INDEPENDENT_AMBULATORY_CARE_PROVIDER_SITE_OTHER): Payer: BC Managed Care – PPO | Admitting: Cardiology

## 2019-10-07 ENCOUNTER — Other Ambulatory Visit: Payer: Self-pay

## 2019-10-07 VITALS — BP 110/80 | HR 83 | Ht 66.0 in | Wt 168.8 lb

## 2019-10-07 DIAGNOSIS — I10 Essential (primary) hypertension: Secondary | ICD-10-CM | POA: Diagnosis not present

## 2019-10-07 DIAGNOSIS — R079 Chest pain, unspecified: Secondary | ICD-10-CM | POA: Diagnosis not present

## 2019-10-07 DIAGNOSIS — Z01812 Encounter for preprocedural laboratory examination: Secondary | ICD-10-CM

## 2019-10-07 MED ORDER — METOPROLOL TARTRATE 100 MG PO TABS: 100.0000 mg | ORAL_TABLET | ORAL | 0 refills | Status: DC

## 2019-10-07 NOTE — Progress Notes (Signed)
Cardiology Office Note:    Date:  10/07/2019   ID:  SHEALY SCHEFFLER, DOB 1966/01/24, MRN [id number]  PCP:  London Pepper, MD  Cardiologist:  Candee Furbish, MD  Electrophysiologist:  None   Referring MD: London Pepper, MD     History of Present Illness:    Ashley Key is a 53 y.o. female here for the evaluation of chest pain at the request of Dr. Orland Mustard.  She was in the emergency department on 09/11/2019 with chest discomfort over 3 to 4 days left-sided and left posterior back described as an aching sensation coming and going worse with deep breaths as well as laying down.  Had trouble sleeping secondary to the discomfort.  Minimal cough.  No fevers chills nausea vomiting syncope bleeding.  Does have a history of multiple sclerosis.  Takes estrogen.  No smoking no alcohol no other drug use.  Has a family history of coronary artery disease with her brother at age 38 having CAD, MI, smoked.  The pain seem to be reproducible by the ER physician when laying supine and improved on palpation.  EKG did not show any ischemic or dynamic changes.  X-ray was performed.  No evidence of pneumonia.  High-sensitivity troponin was normal at 6 and 8.  D-dimer was also normal.  Covid was not detected.  No rash detected.  Back in 2017 she had a normal exercise treadmill test performed.  Calcium score was 0 in 2017, previously seen by Dr. Debara Pickett.    Past Medical History:  Diagnosis Date  . ADHD (attention deficit hyperactivity disorder)   . Arch pain   . Arthritis    FEET  . Ataxic gait 10/15/2017  . Attention deficit 10/15/2017  . Blood dyscrasia    NORMAL PLATLETS 50-70'S  . Bunion   . Chest pain, unspecified 01/18/2016  . Cognitive deficit secondary to multiple sclerosis ([place]) 01/13/2018  . Diplopia 08/10/2019  . Essential hypertension 01/18/2016  . Graves disease   . Headache    HISTORY MIGRAINE  . Hypertension   . Hypothyroidism 01/18/2016  . Idiopathic thrombocytopenic purpura ([place]) 06/27/2014  . ITP  (idiopathic thrombocytopenic purpura)   . Memory loss 10/15/2017  . Multiple sclerosis ([place]) 10/23/2017  . Numbness 10/15/2017  . Pelvic fracture ([place])   . Pneumonia   . Screening, lipid 01/18/2016  . Thrombocytopenia ([place]) 06/27/2014  . White matter abnormality on MRI of brain 10/15/2017    Past Surgical History:  Procedure Laterality Date  . DILITATION & CURRETTAGE/HYSTROSCOPY WITH HYDROTHERMAL ABLATION N/A 09/27/2015   Procedure: DILATATION & CURETTAGE/HYSTEROSCOPY WITH HYDROTHERMAL ABLATION;  Surgeon: Thurnell Lose, MD;  Location: [facility];  Service: Gynecology;  Laterality: N/A;  ultrasound guidance   . FOOT SURGERY    . THYROID RADATION 2016    . WISDOM TOOTH EXTRACTION      Current Medications: Current Meds  Medication Sig  . amLODipine (NORVASC) 5 MG tablet Take 5 mg by mouth daily.   Marland Kitchen amphetamine-dextroamphetamine (ADDERALL XR) 20 MG 24 hr capsule Take 1 capsule (20 mg total) by mouth every morning.  Marland Kitchen CALCIUM-VITAMIN D PO Take 1 tablet by mouth daily.  . COMBIPATCH 0.05-0.14 MG/DAY Place 1 patch onto the skin 2 (two) times a week. Change on Sunday and Thursday  . Multiple Vitamins-Minerals (MULTIVITAMIN PO) Take 1 tablet by mouth daily.  . naproxen sodium (ALEVE) 220 MG tablet Take 440 mg by mouth 2 (two) times daily as needed (pain).  Marland Kitchen ocrelizumab 600 mg in sodium chloride 0.9 %  500 mL Inject 600 mg into the vein every 6 (six) months.   . Omega-3 Fatty Acids (FISH OIL) 1200 MG CAPS Take 1,200 mg by mouth daily.  . rosuvastatin (CRESTOR) 5 MG tablet TAKE 1 TABLET(5 MG) BY MOUTH DAILY  . sertraline (ZOLOFT) 100 MG tablet Take 150 mg by mouth at bedtime.   Marland Kitchen SYNTHROID 112 MCG tablet Take 112 mcg by mouth daily before breakfast.   . TURMERIC PO Take 1 tablet by mouth daily.     Allergies:   Patient has no known allergies.   Social History   Socioeconomic History  . Marital status: Married    Spouse name: Collier Salina  . Number of children: 1  . Years of education: Not on file   . Highest education level: Not on file  Occupational History  . Occupation: Clinical research associate: [employer]  Social Needs  . Financial resource strain: Not on file  . Food insecurity    Worry: Not on file    Inability: Not on file  . Transportation needs    Medical: Not on file    Non-medical: Not on file  Tobacco Use  . Smoking status: Never Smoker  . Smokeless tobacco: Never Used  Substance and Sexual Activity  . Alcohol use: Yes    Comment: social  . Drug use: No  . Sexual activity: Yes  Lifestyle  . Physical activity    Days per week: Not on file    Minutes per session: Not on file  . Stress: Not on file  Relationships  . Social Herbalist on phone: Not on file    Gets together: Not on file    Attends religious service: Not on file    Active member of club or organization: Not on file    Attends meetings of clubs or organizations: Not on file    Relationship status: Not on file  Other Topics Concern  . 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  ROS:   Please see the history of present illness.    No fevers chills nausea vomiting syncope bleeding all other systems reviewed and are negative.  EKGs/Labs/Other Studies Reviewed:    The following studies were reviewed today: See above.  EKG: 10/07/2019-sinus rhythm 83 possible left atrial enlargement borderline low voltage no ischemic changes October 2020 sinus rhythm with poor R wave progression. Recent Labs: 08/10/2019: ALT 11 09/11/2019: BUN 19; Creatinine,  Ser 0.82; Hemoglobin 13.5; Platelets 177; Potassium 3.9; Sodium 138  Recent Lipid Panel    Component Value Date/Time   CHOL 224 (H) 01/22/2016 1632   TRIG 64 01/22/2016 1632   HDL 72 01/22/2016 1632   CHOLHDL 3.1 01/22/2016 1632   LDLCALC 139 (H) 01/22/2016 1632    Physical Exam:    VS:  BP 110/80   Pulse 83   Ht 5\' 6"  (1.676 m)   Wt 168 lb 12.8 oz (76.6 kg)   SpO2 97%   BMI 27.25  kg/m     Wt Readings from Last 3 Encounters:  10/07/19 168 lb 12.8 oz (76.6 kg)  08/10/19 165 lb 8 oz (75.1 kg)  05/19/19 168 lb 8 oz (76.4 kg)     GEN:  Well nourished, well developed in no acute distress HEENT: Normal NECK: No JVD; No carotid bruits LYMPHATICS: No lymphadenopathy CARDIAC: RRR, no murmurs, rubs, gallops RESPIRATORY:  Clear to auscultation without rales, wheezing or rhonchi  ABDOMEN: Soft, non-tender, non-distended MUSCULOSKELETAL:  No edema; No deformity  SKIN: Warm and dry NEUROLOGIC:  Alert and oriented x 3 PSYCHIATRIC:  Normal affect   ASSESSMENT:    1. Essential hypertension   2. Chest pain, unspecified type   3. Pre-procedure lab exam    PLAN:    In order of problems listed above:  Atypical chest discomfort -3 years ago reassuring work-up with exercise treadmill test and 0 calcium score.  She went to the emergency room once again with chest pain seem to be somewhat positional and changed with palpation.  We will go ahead and further work-up with coronary CT scan with possible FFR analysis especially given her strong family history with her brother having MI at age 61.  Metoprolol 100 mg prior to study.  Hyperlipidemia -Continue with Crestor.  Essential hypertension -Excellent use of amlodipine.  Last creatinine 0.88  We will follow-up with results of study.   Medication Adjustments/Labs and Tests Ordered: Current medicines are reviewed at length with the patient today.  Concerns regarding medicines are outlined above.  Orders Placed This Encounter   Procedures  . CT CORONARY MORPH W/CTA COR W/SCORE W/CA W/CM &/OR WO/CM  . CT CORONARY FRACTIONAL FLOW RESERVE DATA PREP  . CT CORONARY FRACTIONAL FLOW RESERVE FLUID ANALYSIS  . Basic metabolic panel  . EKG 12-Lead   Meds ordered this encounter  Medications  . metoprolol tartrate (LOPRESSOR) 100 MG tablet    Sig: Take 1 tablet (100 mg total) by mouth as directed for 1 day. Take (1) tablet 2 hours before your CT scan    Dispense:  1 tablet    Refill:  0    Patient Instructions  Medication Instructions:  The current medical regimen is effective;  continue present plan and medications.  *If you need a refill on your cardiac medications before your next appointment, please call your pharmacy*  Lab Work: Please have blood work before your CT scan. If you have labs (blood work) drawn today and your tests are completely normal, you will receive your results only by: Marland Kitchen MyChart Message (if you have MyChart) OR . A paper copy in the mail If you have any lab test that is abnormal or we need to change your treatment, we will call you to review the results.  Testing/Procedures: Your physician has requested that you have cardiac CT. Cardiac computed tomography (CT) is a painless test that uses an x-ray machine to take clear, detailed pictures of your heart. For further information please visit HugeFiesta.tn. Please follow instruction sheet as given.  Follow-Up: Will be determined after the above testing.  Thank you for choosing [facility]!!    CT SCAN: [facility] [address] [place], [place] [address] [phone]  Please arrive at the [facility] main entrance of [facility] 30-45 minutes prior to test start time. Proceed to the [facility] Radiology Department (first floor) to check-in and test prep.  Please follow these instructions carefully (unless otherwise directed):  On the Night Before the  Test: . Be sure to Drink plenty of water.  . Do not consume any caffeinated/decaffeinated beverages or chocolate 12 hours prior to your test. . Do not take any antihistamines 12 hours prior to your test.  On the Day of the Test: . Drink plenty of water. Do not drink any water within one hour of the test. . Do not eat any food 4 hours prior to the test. . You may take your regular medications prior to the test.  . Take metoprolol (Lopressor) two hours prior to test. . HOLD Furosemide/Hydrochlorothiazide morning of the test. . FEMALES- please wear underwire-free bra if available   After the Test: . Drink plenty of water. . After receiving IV contrast, you may experience a mild flushed feeling. This is normal. . On occasion, you may experience a mild rash up to 24 hours after the test. This is not dangerous. If this occurs, you can take Benadryl 25 mg and increase your fluid intake. . If you experience trouble breathing, this can be serious. If it is severe call 911 IMMEDIATELY. If it is mild, please call our office. . If you take any of these medications: Glipizide/Metformin, Avandament, Glucavance, please do not take 48 hours after completing test unless otherwise instructed.   Once we have confirmed authorization from your insurance company, we will call you to set up a date and time for your test.   For non-scheduling related questions, please contact the cardiac imaging nurse navigator should you have any questions/concerns: Marchia Bond, RN Navigator Cardiac Imaging [facility] [phone] Office       Signed, Candee Furbish, MD  10/07/2019 2:36 PM    [place]

## 2019-10-07 NOTE — Patient Instructions (Addendum)
Medication Instructions:  The current medical regimen is effective;  continue present plan and medications.  *If you need a refill on your cardiac medications before your next appointment, please call your pharmacy*  Lab Work: Please have blood work before your CT scan. If you have labs (blood work) drawn today and your tests are completely normal, you will receive your results only by: Marland Kitchen MyChart Message (if you have MyChart) OR . A paper copy in the mail If you have any lab test that is abnormal or we need to change your treatment, we will call you to review the results.  Testing/Procedures: Your physician has requested that you have cardiac CT. Cardiac computed tomography (CT) is a painless test that uses an x-ray machine to take clear, detailed pictures of your heart. For further information please visit HugeFiesta.tn. Please follow instruction sheet as given.  Follow-Up: Will be determined after the above testing.  Thank you for choosing [facility]!!    CT SCAN: [facility] [address] [place], [place] [address] [phone]  Please arrive at the [facility] main entrance of [facility] 30-45 minutes prior to test start time. Proceed to the [facility] Radiology Department (first floor) to check-in and test prep.  Please follow these instructions carefully (unless otherwise directed):  On the Night Before the Test: . Be sure to Drink plenty of water. . Do not consume any caffeinated/decaffeinated beverages or chocolate 12 hours prior to your test. . Do not take any antihistamines 12 hours prior to your test.  On the Day of the Test: . Drink plenty of water. Do not drink any water within one hour of the test. . Do not eat any food 4 hours prior to the test. . You may take your regular medications prior to the test.  . Take metoprolol (Lopressor) two hours prior to test. . HOLD Furosemide/Hydrochlorothiazide morning of the test. .  FEMALES- please wear underwire-free bra if available   After the Test: . Drink plenty of water. . After receiving IV contrast, you may experience a mild flushed feeling. This is normal. . On occasion, you may experience a mild rash up to 24 hours after the test. This is not dangerous. If this occurs, you can take Benadryl 25 mg and increase your fluid intake. . If you experience trouble breathing, this can be serious. If it is severe call 911 IMMEDIATELY. If it is mild, please call our office. . If you take any of these medications: Glipizide/Metformin, Avandament, Glucavance, please do not take 48 hours after completing test unless otherwise instructed.   Once we have confirmed authorization from your insurance company, we will call you to set up a date and time for your test.   For non-scheduling related questions, please contact the cardiac imaging nurse navigator should you have any questions/concerns: Marchia Bond, RN Navigator Cardiac Imaging [facility] [phone] Office

## 2019-10-08 ENCOUNTER — Other Ambulatory Visit: Payer: Self-pay | Admitting: Cardiology

## 2019-10-13 ENCOUNTER — Telehealth: Payer: Self-pay | Admitting: Cardiology

## 2019-10-13 ENCOUNTER — Other Ambulatory Visit: Payer: Self-pay | Admitting: Neurology

## 2019-10-13 MED ORDER — AMPHETAMINE-DEXTROAMPHET ER 20 MG PO CP24: 20.0000 mg | ORAL_CAPSULE | ORAL | 0 refills | Status: DC

## 2019-10-13 NOTE — Telephone Encounter (Signed)
Pt called needing a refill on her amphetamine-dextroamphetamine (ADDERALL XR) 20 MG 24 hr capsule sent to the [facility] on [address]

## 2019-10-13 NOTE — Telephone Encounter (Signed)
New Message    Pt is calling to inquire about scheduling a CT     Please call

## 2019-10-18 DIAGNOSIS — F411 Generalized anxiety disorder: Secondary | ICD-10-CM | POA: Diagnosis not present

## 2019-11-10 ENCOUNTER — Telehealth (HOSPITAL_COMMUNITY): Payer: Self-pay | Admitting: Emergency Medicine

## 2019-11-10 NOTE — Telephone Encounter (Signed)
Reaching out to patient to offer assistance regarding upcoming cardiac imaging study; pt verbalizes understanding of appt date/time, parking situation and where to check in, pre-test NPO status and medications ordered, and verified current allergies; name and call back number provided for further questions should they arise Dynasia Kercheval RN Navigator Cardiac Imaging [facility] [phone] office [phone] cell 

## 2019-11-11 ENCOUNTER — Ambulatory Visit (HOSPITAL_COMMUNITY)
Admission: RE | Admit: 2019-11-11 | Discharge: 2019-11-11 | Disposition: A | Discharge status: Home / Self Care | Payer: BC Managed Care – PPO | Source: Ambulatory Visit | Attending: Cardiology | Admitting: Cardiology

## 2019-11-11 ENCOUNTER — Other Ambulatory Visit: Payer: Self-pay

## 2019-11-11 DIAGNOSIS — R079 Chest pain, unspecified: Secondary | ICD-10-CM | POA: Insufficient documentation

## 2019-11-11 DIAGNOSIS — F411 Generalized anxiety disorder: Secondary | ICD-10-CM | POA: Diagnosis not present

## 2019-11-11 MED ORDER — NITROGLYCERIN 0.4 MG SL SUBL
0.8000 mg | SUBLINGUAL_TABLET | Freq: Once | SUBLINGUAL | Status: AC
  Administered 2019-11-11: 13:00:00 0.8 mg via SUBLINGUAL

## 2019-11-11 MED ORDER — IOHEXOL 350 MG/ML SOLN
80.0000 mL | Freq: Once | INTRAVENOUS | Status: AC | PRN
  Administered 2019-11-11: 14:00:00 80 mL via INTRAVENOUS

## 2019-11-11 MED ORDER — NITROGLYCERIN 0.4 MG SL SUBL
SUBLINGUAL_TABLET | SUBLINGUAL | Status: AC
  Filled 2019-11-11: qty 2

## 2019-11-17 DIAGNOSIS — Z01419 Encounter for gynecological examination (general) (routine) without abnormal findings: Secondary | ICD-10-CM | POA: Diagnosis not present

## 2019-11-18 ENCOUNTER — Ambulatory Visit (INDEPENDENT_AMBULATORY_CARE_PROVIDER_SITE_OTHER): Payer: BC Managed Care – PPO | Admitting: Neurology

## 2019-11-18 ENCOUNTER — Encounter: Payer: Self-pay | Admitting: Neurology

## 2019-11-18 ENCOUNTER — Other Ambulatory Visit: Payer: Self-pay

## 2019-11-18 VITALS — BP 117/87 | HR 90 | Temp 97.5°F | Ht 66.0 in | Wt 172.0 lb

## 2019-11-18 DIAGNOSIS — R200 Anesthesia of skin: Secondary | ICD-10-CM | POA: Diagnosis not present

## 2019-11-18 DIAGNOSIS — F411 Generalized anxiety disorder: Secondary | ICD-10-CM | POA: Diagnosis not present

## 2019-11-18 DIAGNOSIS — G35 Multiple sclerosis: Secondary | ICD-10-CM

## 2019-11-18 DIAGNOSIS — R41840 Attention and concentration deficit: Secondary | ICD-10-CM

## 2019-11-18 DIAGNOSIS — R260 Ataxic gait: Secondary | ICD-10-CM

## 2019-11-18 DIAGNOSIS — F09 Unspecified mental disorder due to known physiological condition: Secondary | ICD-10-CM

## 2019-11-18 DIAGNOSIS — Z79899 Other long term (current) drug therapy: Secondary | ICD-10-CM | POA: Diagnosis not present

## 2019-11-18 NOTE — Progress Notes (Addendum)
[facility]  PATIENT: Ashley Key DOB: 05/26/1966  REFERRING DOCTOR OR PCP:  London Pepper (PCP); Rachel Moulds (ADD) SOURCE: patient, notes from Dr. Orland Mustard, imaging/lab reports, MRI images on PACS  _________________________________   HISTORICAL  CHIEF COMPLAINT:  Chief Complaint  Patient presents with  . Follow-up    RM 12, alone. Last seen 08/10/2019. Pt reports more memory issues. She ran out of adderall about a month ago, thinks son took some. She has been doing ok without but wants to see if MD recommends she go back on it.    . Multiple Sclerosis    On Ocrevus. Last infusion: 08/19/2019. Next due around 02/2020.    HISTORY OF PRESENT ILLNESS:  Ashley Key is a 53 y.o. woman with relapsing remitting MS diagnosed 10/2017 .    Her main issues involve cognition with reduced ability to learn and focus  Update 11/18/2019: She is reporting more issues with her memory.  An example her husband told her where he was going to be and 30 minutes later she didn't remember.    She has had many other episodes as well.    Her last script went missing (she has a 54 year old).  We discussed importance of locking up her medications.   Mood is doing better than earlier in the year.    She is on sertraline.     She is on Ocrevus for her RRMS and tolerates it well.  She has not had any exacerbations on it. However, she had intermittent diplopia so we checked an MRI 08/17/2019 and it was unchanged.    She is still having some tripping with walking due to balance issues.  Strength is ok.   She has some left foot numbness (small patch near toes on heel).   She has more trouble with her bladder.   She has urgency but also notes hesitancy with low flow.      Update 08/10/2019: She had the onset of diplopia spells a week ago that lasted until yesterday.   She had only one spell yesterday and no diplopia today.   She noted more episodes of diplopia occurred later in the afternoon or  evening.   Each spell would last 15-20 minutes.    She did not feel her balance was off more than usual.    She had another fall a couple months ago.    She does note that she will be a little more off balanced if she has a glass of wine.   No change in bladder function.     She has a pins and needles sensation in her left heel.    She saw ophthalmology but they did not find any ocular abnormality.  I reviewed their notes.  She continues to have fatigue and reduced focus and attention with other cognitive issues from her MS.  She notes some word finding issues at times.  Adderall helps some of these issues.  Since starting Ocrevus, the thrombocytopenia has resolved.  We discussed that Rituxan is sometimes used for ITP and Ocrevus at the same mechanism.  Update 05/19/2019: She is on Ocrevus and tolerates it well.   She walks a couple times a week with a friend on a flat surface.  After a 1/2 mile she has a noticeable limp, with some right leg weakness.   Gait is usually good most of the day.    She also notes right toe numbness (always 1st and 2nd and sometimes the  rest).  Her heels also go numb, sometimes.   This usually occurs while sitting and she sometimes notes sciatic type pain, as well.    She has urinary urgency.     No issues with vision.   She notes mild decreased cognition with reduced verbal fluency and processing speed.   Fatigue is worse with heat.    Adderall helps the cognition and fatigue.  She is sleeping ok most nights.   She rarely takes sertraline at night (2/month).    Mood is ok.   She has sertraline but rarely takes.     Update 01/21/2019: She has had a couple Ocrevus infusions and has tolerated them well.  She denies any exacerbations.  Her next infusion will be in March.  She feels she is doing about the same neurologically.  Specifically, her gait is a little off balance but she walks without a cane.  She is able to walk at least a mile without stopping.  She has some numbness  in the feet, right greater than left.  This improved for a while but came back a different way (now more in digit 45 rather than 2/3 in past).   There is no significant weakness or spasticity.  Visual acuity is okay.  She does have diplopia now and then, especially when tired in the evening.  She has occasional mild urge incontinence..  She has mild neurocognitive deficits due to the MS and also has attention deficit disorder.  She does better with Adderall.  She tolerates it well.  She has mild depression and is frustrated by the cognitive issues   She is taking sertraline but not regularly.  She is still apathetic.   She is sleeping better.   Her son is now in a boarding school  Update 07/21/2018: She started the Ocrevus and had the 2 halves of the first dose in March and will have her next infusion.  Her second dose was followed by 3-4 days of increased fatigue but no rashes.    She denies any weakness.  She does have some numbness especially in the right leg.  Her gait is slightly off balance at times.   She is not walking as well as before MS but can walk a mile.    She has mild diplopia at times.  She has had neurocognitive testing and has mild neurocognitive deficit due to MS (I reviewed these in her presence).  She also has ADHD.  She had most difficulties with verbal learning, memory and slow processing speed.   She is noting verbal fluency issues.   Symptomatically, she feels she does better with the Adderall.  She has tolerated well.   She is noting mild depression some days.    Her son has ADHD/OCD and is challenging.    She is on Zoloft and feels it helps her a little bit.       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 Based on my clinical interview with the patient and her history, I concur with the diagnostic impressions described in her previous neuropsychological evaluation by Dr. Norton Pastel (dated 05/19/2018). Specifically, it  appears that the patient is experiencing mild neurocognitive disorder due to MS and preexisting ADHD.  Psychological testing did not reveal any indication of additional psychiatric component.  I also concur with the recommendations provided by Dr. Vikki Ports in his report.  SUMMARY & IMPRESSION (by Norton Pastel, PhD):  Mrs. Mikali Willhoite is  a 53 year old, right-handed, Caucasian female, who reported experiencing cognitive deficits that began approximately 2 years ago. This is in the context of recently diagnosed relapsing-remitting multiple sclerosis.  Test results revealed intact functioning across most cognitive domains and thinking skills assessed during this evaluation with the exception of reduced aspects of verbal learning and memory as well as mildly slowed processing speed. From an emotional standpoint, there appears to be at least a moderate degree of recent depression and mild-to-moderate anxiety.  Mrs. Edelen's performance is consistent with a diagnosis of Mild Neurocognitive Disorder (i.e., mild cognitive impairment). With regard to etiology, these results are likely due to the cognitive consequences of multiple sclerosis and mood disruption.   Also of note, she has ITP and the decision to use Ocrevus was discussed with her hematologist.  Update 01/13/2018:    At the last visit we had discussed treatment options and she opted to go on ocrelizumab. She has not yet received her first infusion. We will check with the infusion service.   Her gait is doing the same.    Strength is stable.     She has more numbness.   She has pins and needles numbness in trhe right foot that started a few weeks ago --- she has older numbness in just the second toe x years.    She is noting wrist pain on her right.      She is noting occasional mild diplopia but most of the time vision is stable. She has urinary urgency with rare incontinence.   She notes fatigue on a daily basis. This is mildly helped by  Adderall.    She has mild cognitive issues (STM, word finding) and decreased focus/attention.    She notes some depression and frustration associated with worery about not working and cognitive issues.       Update 10/23/2017:   I reviewed the recent MRI of the brain and cervical spine with Ms. Kanak. The MRI of the cervical spine shows a focus in the mid spinal cord at the cervicomedullary junction. The MRI of the brain with contrast (previously had without) showed 1 subtle small enhancing focus. The combination of her symptoms and MRIs are consistent with multiple sclerosis and we discussed therapeutic options..  We discussed treatment options. As she has potential for a more progressive MS (has a spinal plaque and older symptoms were her first evidence of disease) and she has ITP, ocrelizumab, an anti-CD20 agent similar to Rituxan, maybe an excellent choice for her MS and also help the ITP.   Alternatively, Tecfidera is a reasonable agent to begin with with MS  Her hematologist for her ITP is Dr. Benay Spice.     From 10/15/2017: Jayse Hammond is a 53 year old woman who began to note difficulties with her memory 18 months ago when she noted difficuty with focus and attention.   She noted more difficulties with her work.   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    Also, last year, she went to [facility] (Dr. Johnnye Sima) but she was felt not to have ADD after testing.   Adderall did not help her much.     She feels her memory deficits are mostly stable this year and her main worsening occurred in 2017.    She is currently not working and feels less stressed but also mildly depressed as she is not  working.   She is doing puzzles and other activities to try to keep her mind stimulated.   She was placed on sertraline 3 years ago when mood initially worsened. She feels her  some benefit from it.   She feels gait is usually good.    She is able to climb stairs and a step stool.    She fell in her tiered garden when she fell down the 2 foot drop and broke her foot.   She had one or 2 other milder falls this year.   She does not note any difficulties with strength or sensation.  She has had bladder frequency and urge incontinence (drips) at times that has worsened the past 2 years and even more so over the past 6 months.   She feels vision is worse but she improved with her new glasses.  She denies color vision changes.   She notes more fatigue and more apathy.   Adderall did not help much.  She has insomnia with more trouble staying asleep than falling asleep.  She often wakes up at 2-4 am and then can't fall back asleep.  Her mind is often racing and she gets out of bed.    She denies headaches, rashes or joint pain.  She had ITP 06/2014 with a platelet count of 12.  She was treated with steroids and improved (Dr. Benay Spice) [name] there was persistent thrombocytopenia (last plt count = 79 and mild lymphopenia/leukopenia.  She also has had low level positive ANA.   She was diagnosed with hyperthyroidism 2 years ago and had radioactive treatment.   She is on Synthroid (112 mcg) and Dr. Orland Mustard is checking her labs.        I personally reviewed the MRI of the brain performed 10/10/2017. It is abnormal showing multiple T2/FLAIR hyperintense foci in the juxtacortical, periventricular and deep white matter. Additionally there is a focus in the left thalamus, right midbrain, right pons and a subtle focus in the right cerebellar hemisphere and possibly at the cervicomedullary junction.  One of the juxtacortical foci on the right is mildly hyperintense on diffusion-weighted images and may be more acute or be artifact.     Montreal Cognitive Assessment  10/15/2017  Visuospatial/ Executive (0/5) 5  Naming (0/3) 3  Attention: Read list of digits (0/2) 2  Attention: Read list of  letters (0/1) 1  Attention: Serial 7 subtraction starting at 100 (0/3) 3  Language: Repeat phrase (0/2) 2  Language : Fluency (0/1) 1  Abstraction (0/2) 2  Delayed Recall (0/5) 0  Orientation (0/6) 6  Total 25  Adjusted Score (based on education) 25     REVIEW OF SYSTEMS: Constitutional: No fevers, chills, sweats, or change in appetite.   She has fatigue.   Mild insomnia.    Eyes: No visual changes, double vision, eye pain Ear, nose and throat: No hearing loss, ear pain, nasal congestion, sore throat Cardiovascular: No chest pain, palpitations Respiratory: No shortness of breath at rest or with exertion.   No wheezes GastrointestinaI: No nausea, vomiting, diarrhea, abdominal pain, fecal incontinence Genitourinary: Urinary urgency and nocturia. Musculoskeletal: No neck pain, back pain Integumentary: No rash, pruritus, skin lesions Neurological: as above Psychiatric: mild depression Endocrine: No palpitations, diaphoresis, change in appetite, change in weigh or increased thirst Hematologic/Lymphatic: No anemia, purpura, petechiae. Allergic/Immunologic: No itchy/runny eyes, nasal congestion, recent allergic reactions, rashes  ALLERGIES: No Known Allergies  HOME MEDICATIONS:  Current Outpatient Medications:  .  amLODipine (NORVASC) 5 MG  tablet, Take 5 mg by mouth daily. , Disp: , Rfl: 2 .  CALCIUM-VITAMIN D PO, Take 1 tablet by mouth daily., Disp: , Rfl:  .  COMBIPATCH 0.05-0.14 MG/DAY, Place 1 patch onto the skin 2 (two) times a week. Change on Sunday and Thursday, Disp: , Rfl:  .  metoprolol tartrate (LOPRESSOR) 100 MG tablet, TAKE 1 TABLET BY MOUTH AS DIRECTED FOR 1 DAY. TAKE 1 TABLET BY MOUTH 2 HOURS BEFORE YOUR CT SCAN, Disp: 1 tablet, Rfl: 0 .  Multiple Vitamins-Minerals (MULTIVITAMIN PO), Take 1 tablet by mouth daily., Disp: , Rfl:  .  naproxen sodium (ALEVE) 220 MG tablet, Take 440 mg by mouth 2 (two) times daily as needed (pain)., Disp: , Rfl:  .  ocrelizumab 600 mg  in sodium chloride 0.9 % 500 mL, Inject 600 mg into the vein every 6 (six) months. , Disp: , Rfl:  .  Omega-3 Fatty Acids (FISH OIL) 1200 MG CAPS, Take 1,200 mg by mouth daily., Disp: , Rfl:  .  rosuvastatin (CRESTOR) 5 MG tablet, TAKE 1 TABLET(5 MG) BY MOUTH DAILY, Disp: 30 tablet, Rfl: 0 .  sertraline (ZOLOFT) 100 MG tablet, Take 150 mg by mouth at bedtime. , Disp: , Rfl: 0 .  SYNTHROID 112 MCG tablet, Take 112 mcg by mouth daily before breakfast. , Disp: , Rfl: 5 .  TURMERIC PO, Take 1 tablet by mouth daily., Disp: , Rfl:  .  amphetamine-dextroamphetamine (ADDERALL XR) 20 MG 24 hr capsule, Take 1 capsule (20 mg total) by mouth every morning. (Patient not taking: Reported on 11/18/2019), Disp: 30 capsule, Rfl: 0 No current facility-administered medications for this visit.  Facility-Administered Medications Ordered in Other Visits:  .  gadopentetate dimeglumine (MAGNEVIST) injection 15 mL, 15 mL, Intravenous, Once PRN, Lujain Kraszewski, Nanine Means, MD  PAST MEDICAL HISTORY: Past Medical History:  Diagnosis Date  . ADHD (attention deficit hyperactivity disorder)   . Arch pain   . Arthritis    FEET  . Ataxic gait 10/15/2017  . Attention deficit 10/15/2017  . Blood dyscrasia    NORMAL PLATLETS 50-70'S  . Bunion   . Chest pain, unspecified 01/18/2016  . Cognitive deficit secondary to multiple sclerosis ([place]) 01/13/2018  . Diplopia 08/10/2019  . Essential hypertension 01/18/2016  . Graves disease   . Headache    HISTORY MIGRAINE  . Hypertension   . Hypothyroidism 01/18/2016  . Idiopathic thrombocytopenic purpura ([place]) 06/27/2014  . ITP (idiopathic thrombocytopenic purpura)   . Memory loss 10/15/2017  . Multiple sclerosis ([place]) 10/23/2017  . Numbness 10/15/2017  . Pelvic fracture ([place])   . Pneumonia   . Screening, lipid 01/18/2016  . Thrombocytopenia ([place]) 06/27/2014  . White matter abnormality on MRI of brain 10/15/2017    PAST SURGICAL HISTORY: Past Surgical History:  Procedure Laterality Date  .  DILITATION & CURRETTAGE/HYSTROSCOPY WITH HYDROTHERMAL ABLATION N/A 09/27/2015   Procedure: DILATATION & CURETTAGE/HYSTEROSCOPY WITH HYDROTHERMAL ABLATION;  Surgeon: Thurnell Lose, MD;  Location: [facility];  Service: Gynecology;  Laterality: N/A;  ultrasound guidance   . FOOT SURGERY    . THYROID RADATION 2016    . WISDOM TOOTH EXTRACTION      FAMILY HISTORY: Family History  Problem Relation Age of Onset  . CAD Mother   . Heart attack Mother   . Stroke Father   . Heart attack Father   . CAD Brother   . Heart attack Brother   . Cancer Brother        prostate cancer  .  Heart attack Maternal Grandmother   . Stroke Maternal Grandmother   . Stroke Paternal Grandmother     SOCIAL HISTORY:  Social History   Socioeconomic History  . Marital status: Married    Spouse name: Collier Salina  . Number of children: 1  . Years of education: Not on file  . Highest education level: Not on file  Occupational History  . Occupation: Clinical research associate: [employer]  Tobacco Use  . Smoking status: Never Smoker  . Smokeless tobacco: Never Used  Substance and Sexual Activity  . Alcohol use: Yes    Comment: social  . Drug use: No  . Sexual activity: Yes  Other Topics Concern  . 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 Difficulty of Paying Living Expenses: Not on file  Food Insecurity:   . Worried About Charity fundraiser in the Last Year: Not on file  . Ran Out of Food in the Last Year: Not on file  Transportation Needs:   . Lack of Transportation (Medical): Not on file  . Lack of Transportation (Non-Medical): Not on file  Physical Activity:   . Days of Exercise per Week: Not on file  . Minutes of Exercise per Session: Not on file   Stress:   . Feeling of Stress : Not on file  Social Connections:   . Frequency of Communication with Friends and Family: Not on file  . Frequency of Social Gatherings with Friends and Family: Not on file  . Attends Religious Services: Not on file  . Active Member of Clubs or Organizations: Not on file  . Attends Archivist Meetings: Not on file  . Marital Status: Not on file  Intimate Partner Violence:   . Fear of Current or Ex-Partner: Not on file  . Emotionally Abused: Not on file  . Physically Abused: Not on file  . Sexually Abused: Not on file     PHYSICAL EXAM  Vitals:   11/18/19 0954  BP: 117/87  Pulse: 90  Temp: (!) 97.5 F (36.4 C)  Weight: 172 lb (78 kg)  Height: 5\' 6"  (1.676 m)    Body mass index is 27.76 kg/m.   General: The patient is well-developed and well-nourished and in no acute distress   Neurologic Exam  Mental status: The patient is alert and oriented x 3 at the time of the examination. Short-term recall and focus attention are reduced (she writes things down).  Speech is normal.  Cranial nerves: Extraocular movements are full. Facial strength and sensation is normal.  The tongue is midline, and the patient has symmetric elevation of the soft palate. No obvious hearing deficits are noted.  Motor:  Muscle bulk is normal.   Muscle tone is normal.. Strength is  5 / 5 in all 4 extremities.   Sensory:   She has symetric sensation today to touch/vibration  Coordination: There is good finger-nose-finger and mildly reduced heel-to-shin, right worse than left.  Gait and station: Station is normal.   Gait is mildly wide.  Tandem gait is wide.  Romberg is negative.  Reflexes: Deep tendon reflexes are symmetric and 2 in  arms and 3 in legs bilaterally with spread at the knees.  There is no ankle clonus.    DIAGNOSTIC DATA (LABS, IMAGING, TESTING) - I reviewed patient records, labs, notes, testing and imaging myself where available.  Lab  Results  Component Value Date   WBC 4.4 09/11/2019   HGB 13.5 09/11/2019   HCT 41.7 09/11/2019   MCV 91.6 09/11/2019   PLT 177 09/11/2019      Component Value Date/Time   NA 138 09/11/2019 0344   K 3.9 09/11/2019 0344   CL 105 09/11/2019 0344   CO2 24 09/11/2019 0344   GLUCOSE 97 09/11/2019 0344   BUN 19 09/11/2019 0344   CREATININE 0.82 09/11/2019 0344   CALCIUM 9.0 09/11/2019 0344   PROT 6.7 08/10/2019 1409   ALBUMIN 4.7 08/10/2019 1409   AST 15 08/10/2019 1409   ALT 11 08/10/2019 1409   ALKPHOS 58 08/10/2019 1409   BILITOT <0.2 08/10/2019 1409   GFRNONAA >60 09/11/2019 0344   GFRAA >60 09/11/2019 0344        ASSESSMENT AND PLAN  Multiple sclerosis (HCC) - Plan: CBC with Differential/Platelets, IgG, IgA, IgM  Numbness  Ataxic gait  Attention deficit  High risk medication use - Plan: CBC with Differential/Platelets, IgG, IgA, IgM  Cognitive deficit secondary to multiple sclerosis ([place])   1.   Continue Ocrevus.  We will check some lab work today 2.   Due to the combination of physical impairments, cognitive impairments and fatigue, she is unable to work 3.   Renew  Adderall XR 20 mg for cognitive issues and fatigue.  If no benefit we will 4.   Return to see me in 6 months or sooner with any new or worsening neurologic symptoms.   Ashley Key A. Felecia Shelling, MD, PhD, FAAN Certified in Neurology, Clinical Neurophysiology, Sleep Medicine, Pain Medicine and Neuroimaging Director, [place] [facility] [address] [place], [place] [address] [phone]

## 2019-11-19 DIAGNOSIS — Z0000 Encounter for general adult medical examination without abnormal findings: Secondary | ICD-10-CM | POA: Diagnosis not present

## 2019-11-19 LAB — CBC WITH DIFFERENTIAL/PLATELET
Basophils Absolute: 0.1 10*3/uL (ref 0.0–0.2)
Basos: 1 %
EOS (ABSOLUTE): 0.2 10*3/uL (ref 0.0–0.4)
Eos: 4 %
Hematocrit: 43.7 % (ref 34.0–46.6)
Hemoglobin: 14.6 g/dL (ref 11.1–15.9)
Immature Grans (Abs): 0 10*3/uL (ref 0.0–0.1)
Immature Granulocytes: 1 %
Lymphocytes Absolute: 0.8 10*3/uL (ref 0.7–3.1)
Lymphs: 22 %
MCH: 30 pg (ref 26.6–33.0)
MCHC: 33.4 g/dL (ref 31.5–35.7)
MCV: 90 fL (ref 79–97)
Monocytes Absolute: 0.5 10*3/uL (ref 0.1–0.9)
Monocytes: 13 %
Neutrophils Absolute: 2.3 10*3/uL (ref 1.4–7.0)
Neutrophils: 59 %
Platelets: 200 10*3/uL (ref 150–450)
RBC: 4.87 x10E6/uL (ref 3.77–5.28)
RDW: 12.1 % (ref 11.7–15.4)
WBC: 3.8 10*3/uL (ref 3.4–10.8)

## 2019-11-19 LAB — IGG, IGA, IGM
IgA/Immunoglobulin A, Serum: 40 mg/dL — ABNORMAL LOW (ref 87–352)
IgG (Immunoglobin G), Serum: 929 mg/dL (ref 586–1602)
IgM (Immunoglobulin M), Srm: 64 mg/dL (ref 26–217)

## 2019-11-22 ENCOUNTER — Telehealth: Payer: Self-pay | Admitting: *Deleted

## 2019-11-22 NOTE — Telephone Encounter (Signed)
-----   Message from Britt Bottom, MD sent at 11/19/2019 11:09 AM EST ----- Please let her know that the blood count looks good.  The IgM and the IgG (the most important) are normal.  The IgA was low but similar to last time.  We can continue the medication.  Ocrevus

## 2019-11-22 NOTE — Telephone Encounter (Signed)
Called and spoke with pt about lab results per Dr. [name] note. Pt verbalized understanding.  

## 2019-12-16 DIAGNOSIS — F411 Generalized anxiety disorder: Secondary | ICD-10-CM | POA: Diagnosis not present

## 2019-12-22 DIAGNOSIS — I10 Essential (primary) hypertension: Secondary | ICD-10-CM | POA: Diagnosis not present

## 2019-12-22 DIAGNOSIS — R413 Other amnesia: Secondary | ICD-10-CM | POA: Diagnosis not present

## 2019-12-22 DIAGNOSIS — Z13220 Encounter for screening for lipoid disorders: Secondary | ICD-10-CM | POA: Diagnosis not present

## 2019-12-22 DIAGNOSIS — D693 Immune thrombocytopenic purpura: Secondary | ICD-10-CM | POA: Diagnosis not present

## 2020-01-06 DIAGNOSIS — F411 Generalized anxiety disorder: Secondary | ICD-10-CM | POA: Diagnosis not present

## 2020-02-04 DIAGNOSIS — E890 Postprocedural hypothyroidism: Secondary | ICD-10-CM | POA: Diagnosis not present

## 2020-02-08 ENCOUNTER — Telehealth: Payer: Self-pay | Admitting: *Deleted

## 2020-02-08 DIAGNOSIS — G35 Multiple sclerosis: Secondary | ICD-10-CM

## 2020-02-08 NOTE — Telephone Encounter (Signed)
Thyroid function can also affect memory.   If not better once that is better, we can set up earlier appt

## 2020-02-08 NOTE — Telephone Encounter (Signed)
I called and spoke with pt. Relayed Dr. Garth Bigness recommendation. She verbalized understanding.  She has appt tomorrow with Dr. Kerr/endocronologist tomorrow. She is agreeable for referral to [facility]. I placed referral. She will call back if anything further needed.

## 2020-02-08 NOTE — Telephone Encounter (Signed)
Pt here today for Ocrevus infusion. Per Lovena Le, RN pt reported worsening short term memory. She has f/u 05/29/20. Wanting to know if Dr. Felecia Shelling recommends anything. She recently had to have radiation on thyroid. They are trying to get her thyroid function back to normal. Having more difficulty with home life as well, more depressed.

## 2020-02-08 NOTE — Telephone Encounter (Signed)
Per Dr. Felecia Shelling, ok to place referral for [facility]

## 2020-02-10 DIAGNOSIS — E890 Postprocedural hypothyroidism: Secondary | ICD-10-CM | POA: Diagnosis not present

## 2020-02-10 DIAGNOSIS — E0500 Thyrotoxicosis with diffuse goiter without thyrotoxic crisis or storm: Secondary | ICD-10-CM | POA: Diagnosis not present

## 2020-02-22 DIAGNOSIS — E0590 Thyrotoxicosis, unspecified without thyrotoxic crisis or storm: Secondary | ICD-10-CM | POA: Diagnosis not present

## 2020-02-29 ENCOUNTER — Telehealth: Payer: Self-pay | Admitting: Neurology

## 2020-02-29 NOTE — Telephone Encounter (Signed)
Patient came in today thinking she had an appointment. Patient was just seen by infusion a few weeks prior. Patient's last appointment with Dr. Felecia Shelling was in December of 2020 and I advised patient that she is not due to come back until 6/21. Patient put this date in her phone. Patient also seemed confused about needing lab work. I told patient that I did not see any notes advising this, but we would contact her if this was needed. FYI

## 2020-03-02 NOTE — Telephone Encounter (Signed)
Pt has called stating she received a call from the Neuro psychiatrist office that a referral must be faxed to them and once processed they will then reach out to pt to schedule.  Phone rep spoke with RN who stated they will fax the referral to the Neuro psychiatrist office Janett Billow Thomas's office).  Pt accepted this response and will wait to here from [facility] office.  This is a Pharmacist, hospital

## 2020-03-02 NOTE — Addendum Note (Signed)
Addended by: Wyvonnia Lora on: 03/02/2020 02:49 PM   Modules accepted: Orders

## 2020-03-08 NOTE — Telephone Encounter (Signed)
Noted Referral Sent

## 2020-03-13 DIAGNOSIS — F418 Other specified anxiety disorders: Secondary | ICD-10-CM | POA: Diagnosis not present

## 2020-03-13 DIAGNOSIS — F458 Other somatoform disorders: Secondary | ICD-10-CM | POA: Diagnosis not present

## 2020-04-04 DIAGNOSIS — F331 Major depressive disorder, recurrent, moderate: Secondary | ICD-10-CM | POA: Diagnosis not present

## 2020-04-05 DIAGNOSIS — Z1152 Encounter for screening for COVID-19: Secondary | ICD-10-CM | POA: Diagnosis not present

## 2020-04-05 DIAGNOSIS — J988 Other specified respiratory disorders: Secondary | ICD-10-CM | POA: Diagnosis not present

## 2020-04-05 DIAGNOSIS — R05 Cough: Secondary | ICD-10-CM | POA: Diagnosis not present

## 2020-04-06 ENCOUNTER — Telehealth: Payer: Self-pay | Admitting: Neurology

## 2020-04-06 NOTE — Telephone Encounter (Signed)
Pt called wanting to speak to RN to inform her and the provider that about 3 weeks ago she got her second dose of the Covid Vaccine and now she was just diagnosed with the Covid Virus. Pt would also like to know if she will still be able to get her Infusion in 6 weeks or will she not be able to get it because of the Virus. Please leave VM if she does not pick up.

## 2020-04-06 NOTE — Telephone Encounter (Signed)
Called pt back. Relayed Dr. Garth Bigness message. She verbalized understanding. She wanted to know when her next appt is. I placed on hold and spoke with Liane,RN. Relayed per Liane that her next infusion is 08/22/20 at 10am. Also relayed her next f/u is on 05/29/20 at 11:30am with Dr. Felecia Shelling. She will discuss at appt whether Dr. Felecia Shelling feels it is okay for her to get Ocrevus infusion after having covid-19. Nothing further needed.

## 2020-04-06 NOTE — Telephone Encounter (Signed)
Since 6 weeks away, this should not be a problems.   If she has no symptoms or fever for at least 24 hours before the next infusion, she will be ok

## 2020-04-06 NOTE — Telephone Encounter (Signed)
Dr. [name]- please advise 

## 2020-04-11 DIAGNOSIS — R05 Cough: Secondary | ICD-10-CM | POA: Diagnosis not present

## 2020-04-11 DIAGNOSIS — U071 COVID-19: Secondary | ICD-10-CM | POA: Diagnosis not present

## 2020-04-14 ENCOUNTER — Emergency Department (HOSPITAL_COMMUNITY): Payer: BC Managed Care – PPO

## 2020-04-14 ENCOUNTER — Encounter (HOSPITAL_COMMUNITY): Payer: Self-pay | Admitting: Emergency Medicine

## 2020-04-14 ENCOUNTER — Emergency Department (HOSPITAL_COMMUNITY)
Admission: EM | Admit: 2020-04-14 | Discharge: 2020-04-14 | Disposition: A | Discharge status: Home / Self Care | Payer: BC Managed Care – PPO | Attending: Emergency Medicine | Admitting: Emergency Medicine

## 2020-04-14 DIAGNOSIS — Z79899 Other long term (current) drug therapy: Secondary | ICD-10-CM | POA: Insufficient documentation

## 2020-04-14 DIAGNOSIS — R519 Headache, unspecified: Secondary | ICD-10-CM | POA: Insufficient documentation

## 2020-04-14 DIAGNOSIS — R509 Fever, unspecified: Secondary | ICD-10-CM | POA: Insufficient documentation

## 2020-04-14 DIAGNOSIS — E039 Hypothyroidism, unspecified: Secondary | ICD-10-CM | POA: Insufficient documentation

## 2020-04-14 DIAGNOSIS — U071 COVID-19: Secondary | ICD-10-CM

## 2020-04-14 DIAGNOSIS — R05 Cough: Secondary | ICD-10-CM | POA: Insufficient documentation

## 2020-04-14 DIAGNOSIS — R0602 Shortness of breath: Secondary | ICD-10-CM | POA: Diagnosis not present

## 2020-04-14 DIAGNOSIS — I10 Essential (primary) hypertension: Secondary | ICD-10-CM | POA: Insufficient documentation

## 2020-04-14 LAB — CBC
HCT: 43.1 % (ref 36.0–46.0)
Hemoglobin: 14.2 g/dL (ref 12.0–15.0)
MCH: 29.7 pg (ref 26.0–34.0)
MCHC: 32.9 g/dL (ref 30.0–36.0)
MCV: 90.2 fL (ref 80.0–100.0)
Platelets: 179 10*3/uL (ref 150–400)
RBC: 4.78 MIL/uL (ref 3.87–5.11)
RDW: 11.9 % (ref 11.5–15.5)
WBC: 3.4 10*3/uL — ABNORMAL LOW (ref 4.0–10.5)
nRBC: 0 % (ref 0.0–0.2)

## 2020-04-14 LAB — COMPREHENSIVE METABOLIC PANEL
ALT: 22 U/L (ref 0–44)
AST: 28 U/L (ref 15–41)
Albumin: 3.8 g/dL (ref 3.5–5.0)
Alkaline Phosphatase: 62 U/L (ref 38–126)
Anion gap: 11 (ref 5–15)
BUN: 12 mg/dL (ref 6–20)
CO2: 25 mmol/L (ref 22–32)
Calcium: 8.7 mg/dL — ABNORMAL LOW (ref 8.9–10.3)
Chloride: 101 mmol/L (ref 98–111)
Creatinine, Ser: 0.86 mg/dL (ref 0.44–1.00)
GFR calc Af Amer: >60 mL/min (ref >60)
GFR calc non Af Amer: >60 mL/min (ref >60)
Glucose, Bld: 106 mg/dL — ABNORMAL HIGH (ref 70–99)
Potassium: 4.2 mmol/L (ref 3.5–5.1)
Sodium: 137 mmol/L (ref 135–145)
Total Bilirubin: 0.5 mg/dL (ref 0.3–1.2)
Total Protein: 6.8 g/dL (ref 6.5–8.1)

## 2020-04-14 NOTE — ED Notes (Signed)
Pts requesting all information be faxed to her PCP.Marland KitchenMarland KitchenMarland Kitchen

## 2020-04-14 NOTE — ED Provider Notes (Signed)
[facility] EMERGENCY DEPARTMENT Provider Note   CSN: [id number] Arrival date & time: 04/14/20  1710     History No chief complaint on file.   Ashley Key is a 54 y.o. female.  HPI Patient presents for ongoing symptoms of cough, fevers, headache, and exertional shortness of breath.  She was diagnosed with COVID-19 approximately 11 days ago.  She has had approximately 14 days of symptoms.  Symptoms were progressing up until 2 days ago.  She reports that yesterday, she felt improvement.  She has been treating her cough with Mucinex.  She has been at home, with her husband and son, who also have COVID-19.  She has been active around the house.  Earlier today, she had a telephone visit with her primary doctor, who instructed her to go to the ED to obtain chest x-ray.  In the ED, patient endorses a cough but denies any other current symptoms.  She reports that she is comfortable at rest.    Past Medical History:  Diagnosis Date  . ADHD (attention deficit hyperactivity disorder)   . Arch pain   . Arthritis    FEET  . Ataxic gait 10/15/2017  . Attention deficit 10/15/2017  . Blood dyscrasia    NORMAL PLATLETS 50-70'S  . Bunion   . Chest pain, unspecified 01/18/2016  . Cognitive deficit secondary to multiple sclerosis ([place]) 01/13/2018  . Diplopia 08/10/2019  . Essential hypertension 01/18/2016  . Graves disease   . Headache    HISTORY MIGRAINE  . Hypertension   . Hypothyroidism 01/18/2016  . Idiopathic thrombocytopenic purpura ([place]) 06/27/2014  . ITP (idiopathic thrombocytopenic purpura)   . Memory loss 10/15/2017  . Multiple sclerosis ([place]) 10/23/2017  . Numbness 10/15/2017  . Pelvic fracture ([place])   . Pneumonia   . Screening, lipid 01/18/2016  . Thrombocytopenia ([place]) 06/27/2014  . White matter abnormality on MRI of brain 10/15/2017    Patient Active Problem List   Diagnosis Date Noted  . Diplopia 08/10/2019  . Cognitive deficit secondary to multiple sclerosis ([place])  01/13/2018  . Multiple sclerosis ([place]) 10/23/2017  . High risk medication use 10/23/2017  . White matter abnormality on MRI of brain 10/15/2017  . Numbness 10/15/2017  . Memory loss 10/15/2017  . Ataxic gait 10/15/2017  . Attention deficit 10/15/2017  . Chest pain, unspecified 01/18/2016  . Screening, lipid 01/18/2016  . Essential hypertension 01/18/2016  . Hypothyroidism 01/18/2016  . Idiopathic thrombocytopenic purpura ([place]) 06/27/2014  . Thrombocytopenia ([place]) 06/27/2014    Past Surgical History:  Procedure Laterality Date  . DILITATION & CURRETTAGE/HYSTROSCOPY WITH HYDROTHERMAL ABLATION N/A 09/27/2015   Procedure: DILATATION & CURETTAGE/HYSTEROSCOPY WITH HYDROTHERMAL ABLATION;  Surgeon: Thurnell Lose, MD;  Location: [facility];  Service: Gynecology;  Laterality: N/A;  ultrasound guidance   . FOOT SURGERY    . THYROID RADATION 2016    . WISDOM TOOTH EXTRACTION       OB History   No obstetric history on file.     Family History  Problem Relation Age of Onset  . CAD Mother   . Heart attack Mother   . Stroke Father   . Heart attack Father   . CAD Brother   . Heart attack Brother   . Cancer Brother        prostate cancer  . Heart attack Maternal Grandmother   . Stroke Maternal Grandmother   . Stroke Paternal Grandmother     Social History   Tobacco Use  . Smoking status:  Never Smoker  . Smokeless tobacco: Never Used  Substance Use Topics  . Alcohol use: Yes    Comment: social  . Drug use: No    Home Medications Prior to Admission medications   Medication Sig Start Date End Date Taking? Authorizing Provider  amLODipine (NORVASC) 5 MG tablet Take 5 mg by mouth daily.  12/01/15   [provider]  amphetamine-dextroamphetamine (ADDERALL XR) 20 MG 24 hr capsule Take 1 capsule (20 mg total) by mouth every morning. Patient not taking: Reported on 11/18/2019 10/13/19   Britt Bottom, MD  CALCIUM-VITAMIN D PO Take 1 tablet by mouth daily.    [provider]  COMBIPATCH 0.05-0.14 MG/DAY Place 1 patch onto the skin 2 (two) times a week. Change on Sunday and Thursday 04/21/16   [provider]  metoprolol tartrate (LOPRESSOR) 100 MG tablet TAKE 1 TABLET BY MOUTH AS DIRECTED FOR 1 DAY. TAKE 1 TABLET BY MOUTH 2 HOURS BEFORE YOUR CT SCAN 10/08/19   Jerline Pain, MD  Multiple Vitamins-Minerals (MULTIVITAMIN PO) Take 1 tablet by mouth daily.    [provider]  naproxen sodium (ALEVE) 220 MG tablet Take 440 mg by mouth 2 (two) times daily as needed (pain).    [provider]  ocrelizumab 600 mg in sodium chloride 0.9 % 500 mL Inject 600 mg into the vein every 6 (six) months.     [provider]  Omega-3 Fatty Acids (FISH OIL) 1200 MG CAPS Take 1,200 mg by mouth daily.    [provider]  rosuvastatin (CRESTOR) 5 MG tablet TAKE 1 TABLET(5 MG) BY MOUTH DAILY 06/12/17   Hilty, Nadean Corwin, MD  sertraline (ZOLOFT) 100 MG tablet Take 150 mg by mouth at bedtime.  09/10/17   [provider]  SYNTHROID 112 MCG tablet Take 112 mcg by mouth daily before breakfast.  04/02/16   [provider]  TURMERIC PO Take 1 tablet by mouth daily.    [provider]    Allergies    Patient has no known allergies.  Review of Systems   Review of Systems  Constitutional: Positive for activity change, appetite change, fatigue and fever.  HENT: Positive for congestion and ear pain (Previously, has since resolved).   Respiratory: Positive for cough and shortness of breath. Negative for choking and wheezing.   Cardiovascular: Negative for chest pain, palpitations and leg swelling.  Gastrointestinal: Negative for abdominal pain, blood in stool, constipation, diarrhea, nausea and vomiting.  Genitourinary: Negative for dysuria and hematuria.  Musculoskeletal: Negative for arthralgias, back pain, gait problem, joint swelling, myalgias and neck pain.  Skin: Negative for color change and rash.    Allergic/Immunologic: Negative for immunocompromised state.  Neurological: Negative for dizziness, seizures, syncope, weakness, light-headedness, numbness and headaches.  Hematological: Does not bruise/bleed easily.  All other systems reviewed and are negative.   Physical Exam Updated Vital Signs BP (!) 121/98 (BP Location: Right Arm)   Pulse 90   Temp 99.1 F (37.3 C) (Oral)   Resp 20   Ht 5\' 6"  (1.676 m)   Wt 77.1 kg   SpO2 99%   BMI 27.44 kg/m   Physical Exam Vitals and nursing note reviewed.  Constitutional:      General: She is not in acute distress.    Appearance: Normal appearance. She is well-developed and normal weight. She is not ill-appearing, toxic-appearing or diaphoretic.  HENT:     Head: Normocephalic and atraumatic.     Right Ear: External  ear normal.     Left Ear: External ear normal.     Nose: Congestion present. No rhinorrhea.     Mouth/Throat:     Mouth: Mucous membranes are moist.     Pharynx: Oropharynx is clear.  Eyes:     General: No scleral icterus.       Right eye: No discharge.        Left eye: No discharge.     Conjunctiva/sclera: Conjunctivae normal.  Cardiovascular:     Rate and Rhythm: Normal rate and regular rhythm.     Heart sounds: No murmur.  Pulmonary:     Effort: Pulmonary effort is normal. No respiratory distress.     Breath sounds: Normal breath sounds. No stridor. No wheezing.  Abdominal:     General: There is no distension.     Palpations: Abdomen is soft.     Tenderness: There is no abdominal tenderness. There is no guarding.  Musculoskeletal:        General: No swelling.     Cervical back: Normal range of motion. No rigidity.     Right lower leg: No edema.     Left lower leg: No edema.  Skin:    General: Skin is warm and dry.     Coloration: Skin is not jaundiced or pale.  Neurological:     General: No focal deficit present.     Mental Status: She is alert and oriented to person, place, and time.     Cranial  Nerves: No cranial nerve deficit.  Psychiatric:        Mood and Affect: Mood normal.        Behavior: Behavior normal.        Thought Content: Thought content normal.        Judgment: Judgment normal.     ED Results / Procedures / Treatments   Labs (all labs ordered are listed, but only abnormal results are displayed) Labs Reviewed  CBC - Abnormal; Notable for the following components:      Result Value   WBC 3.4 (*)    All other components within normal limits  COMPREHENSIVE METABOLIC PANEL - Abnormal; Notable for the following components:   Glucose, Bld 106 (*)    Calcium 8.7 (*)    All other components within normal limits    EKG None  Radiology DG Chest 2 View  Result Date: 04/14/2020 CLINICAL DATA:  Cough. COVID pneumonia. EXAM: CHEST - 2 VIEW COMPARISON:  09/08/2019 FINDINGS: There is scattered bilateral pulmonary airspace opacities which are new since prior study. The heart size is normal. There is no pneumothorax. There is no large pleural effusion. There is no acute osseous abnormality. IMPRESSION: Findings consistent with multifocal viral pneumonia in the appropriate clinical setting. Electronically Signed   By: Constance Holster M.D.   On: 04/14/2020 18:47    Procedures Procedures (including critical care time)  Medications Ordered in ED Medications - No data to display  ED Course  I have reviewed the triage vital signs and the nursing notes.  Pertinent labs & imaging results that were available during my care of the patient were reviewed by me and considered in my medical decision making (see chart for details).    MDM Rules/Calculators/A&P                      Patient is a 54 year old female who presents for COVID-19 related symptoms.  She is currently on day 14 of symptoms.  She reports improvement yesterday.  Upon arrival in the ED, she has low-grade fever of 100.3 degrees.  She is normotensive with normal heart rate.  Respirations are unlabored.  SPO2 is  100% on room air.  Patient is able to speak in complete sentences and have conversation without any difficulty.  While in the exam room, she does have frequent cough, consistent with COVID-19 illness.  Laboratory results (CBC and CMP) were unremarkable.  Chest x-ray showed no evidence of diffuse opacities to suggest severe multifocal pneumonia secondary to COVID-19.  There were no focal areas of consolidation to suggest bacterial superinfection.  Given her reassuring vital signs and exam in addition to her report of improved symptoms starting yesterday, no further diagnostic studies are indicated.  SPO2 is 100% on room air.  Patient does not require any supplemental oxygen.  Given her mild symptoms, Decadron and/or remdesivir not indicated.  Given no evidence of bacterial superinfection, antibiotics are not indicated at this time.  Patient is appropriate for discharge so she can continue to rest and heal at home.  Patient was discharged in stable condition.  . Final Clinical Impression(s) / ED Diagnoses Final diagnoses:  [id number]    Rx / DC Orders ED Discharge Orders    None       Godfrey Pick, MD 04/15/20 [id number]    Blanchie Dessert, MD 04/17/20 [id number]

## 2020-04-14 NOTE — ED Triage Notes (Signed)
Pt here sent over by her MD to get a chest x ray to r/o PNA , pt was dx with covid apporox 2 weeks ago

## 2020-04-14 NOTE — ED Notes (Signed)
Patient verbalizes understanding of discharge instructions. Opportunity for questioning and answers were provided. Pt discharged from ED stable & ambulatory.   

## 2020-04-19 DIAGNOSIS — J129 Viral pneumonia, unspecified: Secondary | ICD-10-CM | POA: Diagnosis not present

## 2020-04-19 DIAGNOSIS — U071 COVID-19: Secondary | ICD-10-CM | POA: Diagnosis not present

## 2020-05-16 DIAGNOSIS — F331 Major depressive disorder, recurrent, moderate: Secondary | ICD-10-CM | POA: Diagnosis not present

## 2020-05-26 DIAGNOSIS — F418 Other specified anxiety disorders: Secondary | ICD-10-CM | POA: Diagnosis not present

## 2020-05-29 ENCOUNTER — Other Ambulatory Visit: Payer: Self-pay

## 2020-05-29 ENCOUNTER — Ambulatory Visit (INDEPENDENT_AMBULATORY_CARE_PROVIDER_SITE_OTHER): Payer: BC Managed Care – PPO | Admitting: Neurology

## 2020-05-29 ENCOUNTER — Encounter: Payer: Self-pay | Admitting: Neurology

## 2020-05-29 VITALS — BP 119/75 | HR 71 | Ht 66.0 in | Wt 180.5 lb

## 2020-05-29 DIAGNOSIS — R200 Anesthesia of skin: Secondary | ICD-10-CM | POA: Diagnosis not present

## 2020-05-29 DIAGNOSIS — G35 Multiple sclerosis: Secondary | ICD-10-CM

## 2020-05-29 DIAGNOSIS — F09 Unspecified mental disorder due to known physiological condition: Secondary | ICD-10-CM

## 2020-05-29 DIAGNOSIS — R41840 Attention and concentration deficit: Secondary | ICD-10-CM | POA: Diagnosis not present

## 2020-05-29 DIAGNOSIS — Z79899 Other long term (current) drug therapy: Secondary | ICD-10-CM | POA: Diagnosis not present

## 2020-05-29 MED ORDER — AMPHETAMINE-DEXTROAMPHET ER 20 MG PO CP24: 20.0000 mg | ORAL_CAPSULE | ORAL | 0 refills | Status: DC

## 2020-05-29 NOTE — Progress Notes (Signed)
[facility]  PATIENT: Ashley Key DOB: 11-17-1966  REFERRING DOCTOR OR PCP:  London Pepper (PCP); Rachel Moulds (ADD) SOURCE: patient, notes from Dr. Orland Mustard, imaging/lab reports, MRI images on PACS  _________________________________   HISTORICAL  CHIEF COMPLAINT:  Chief Complaint  Patient presents with  . Multiple Sclerosis    Room 12. She is still on Ocrevus. She would like to discuss restarting Adderall. She was diagnosed with COVID-19 at the end of April. States she was five days past getting her second vaccination.     HISTORY OF PRESENT ILLNESS:  Ashley Key is a 54 y.o. woman with relapsing remitting MS diagnosed 10/2017.  Her main issues involve cognition with reduced ability to learn and focus  Update 05/29/2020: She feels her MS is mostly stable.   She is on Ocrevus.  Her next infusion is 08/22/2020 Gait and strength are ok.   She notes numbness in her right foot.   Her heels are tender, right > left, worse when walking on hardwoods barefoot. Bladder is doing better.     She has fatigue and notes reduced focus/attention.   She has not taken Adderall recently (conserned son would steal).     She has had a lot of stress with family issues involving her son with legal/substance issues.     She had Covid-19 in April.  She had increased fatigue and viral pneumonia seen on CXR but never needed hospitalization.     Of note she was vaccinated, in March and April --- but the second shot was only 5 days before her Covid positive test.   Her son had an URI a few days earlier and his friend's brother had it about a week earlier.       Update 11/18/2019: She is reporting more issues with her memory.  An example her husband told her where he was going to be and 30 minutes later she didn't remember.    She has had many other episodes as well.    Her last script went missing (she has a 55 year old).  We discussed importance of locking up her medications.   Mood is doing  better than earlier in the year.    She is on sertraline.     She is on Ocrevus for her RRMS and tolerates it well.  She has not had any exacerbations on it. However, she had intermittent diplopia so we checked an MRI 08/17/2019 and it was unchanged.    She is still having some tripping with walking due to balance issues.  Strength is ok.   She has some left foot numbness (small patch near toes on heel).   She has more trouble with her bladder.   She has urgency but also notes hesitancy with low flow.      Update 08/10/2019: She had the onset of diplopia spells a week ago that lasted until yesterday.   She had only one spell yesterday and no diplopia today.   She noted more episodes of diplopia occurred later in the afternoon or evening.   Each spell would last 15-20 minutes.    She did not feel her balance was off more than usual.    She had another fall a couple months ago.    She does note that she will be a little more off balanced if she has a glass of wine.   No change in bladder function.     She has a pins and needles sensation in  her left heel.    She saw ophthalmology but they did not find any ocular abnormality.  I reviewed their notes.  She continues to have fatigue and reduced focus and attention with other cognitive issues from her MS.  She notes some word finding issues at times.  Adderall helps some of these issues.  Since starting Ocrevus, the thrombocytopenia has resolved.  We discussed that Rituxan is sometimes used for ITP and Ocrevus at the same mechanism.  Update 05/19/2019: She is on Ocrevus and tolerates it well.   She walks a couple times a week with a friend on a flat surface.  After a 1/2 mile she has a noticeable limp, with some right leg weakness.   Gait is usually good most of the day.    She also notes right toe numbness (always 1st and 2nd and sometimes the rest).  Her heels also go numb, sometimes.   This usually occurs while sitting and she sometimes notes sciatic type  pain, as well.    She has urinary urgency.     No issues with vision.   She notes mild decreased cognition with reduced verbal fluency and processing speed.   Fatigue is worse with heat.    Adderall helps the cognition and fatigue.  She is sleeping ok most nights.   She rarely takes sertraline at night (2/month).    Mood is ok.   She has sertraline but rarely takes.     Update 01/21/2019: She has had a couple Ocrevus infusions and has tolerated them well.  She denies any exacerbations.  Her next infusion will be in March.  She feels she is doing about the same neurologically.  Specifically, her gait is a little off balance but she walks without a cane.  She is able to walk at least a mile without stopping.  She has some numbness in the feet, right greater than left.  This improved for a while but came back a different way (now more in digit 45 rather than 2/3 in past).   There is no significant weakness or spasticity.  Visual acuity is okay.  She does have diplopia now and then, especially when tired in the evening.  She has occasional mild urge incontinence..  She has mild neurocognitive deficits due to the MS and also has attention deficit disorder.  She does better with Adderall.  She tolerates it well.  She has mild depression and is frustrated by the cognitive issues   She is taking sertraline but not regularly.  She is still apathetic.   She is sleeping better.   Her son is now in a boarding school  Update 07/21/2018: She started the Ocrevus and had the 2 halves of the first dose in March and will have her next infusion.  Her second dose was followed by 3-4 days of increased fatigue but no rashes.    She denies any weakness.  She does have some numbness especially in the right leg.  Her gait is slightly off balance at times.   She is not walking as well as before MS but can walk a mile.    She has mild diplopia at times.  She has had neurocognitive testing and has mild neurocognitive deficit due to MS  (I reviewed these in her presence).  She also has ADHD.  She had most difficulties with verbal learning, memory and slow processing speed.   She is noting verbal fluency issues.   Symptomatically, she feels she does better  with the Adderall.  She has tolerated well.   She is noting mild depression some days.    Her son has ADHD/OCD and is challenging.    She is on Zoloft and feels it helps her a little bit.       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 Based on my clinical interview with the patient and her history, I concur with the diagnostic impressions described in her previous neuropsychological evaluation by Dr. Norton Pastel (dated 05/19/2018). Specifically, it appears that the patient is experiencing mild neurocognitive disorder due to MS and preexisting ADHD.  Psychological testing did not reveal any indication of additional psychiatric component.  I also concur with the recommendations provided by Dr. Vikki Ports in his report.  SUMMARY & IMPRESSION (by Norton Pastel, PhD):  Mrs. Ashley Key is a 54 year old, right-handed, Caucasian female, who reported experiencing cognitive deficits that began approximately 2 years ago. This is in the context of recently diagnosed relapsing-remitting multiple sclerosis.  Test results revealed intact functioning across most cognitive domains and thinking skills assessed during this evaluation with the exception of reduced aspects of verbal learning and memory as well as mildly slowed processing speed. From an emotional standpoint, there appears to be at least a moderate degree of recent depression and mild-to-moderate anxiety.  Ashley Key performance is consistent with a diagnosis of Mild Neurocognitive Disorder (i.e., mild cognitive impairment). With regard to etiology, these results are likely due to the cognitive consequences of multiple sclerosis and mood disruption.   Also of note, she has  ITP and the decision to use Ocrevus was discussed with her hematologist.  Update 01/13/2018:    At the last visit we had discussed treatment options and she opted to go on ocrelizumab. She has not yet received her first infusion. We will check with the infusion service.   Her gait is doing the same.    Strength is stable.     She has more numbness.   She has pins and needles numbness in trhe right foot that started a few weeks ago --- she has older numbness in just the second toe x years.    She is noting wrist pain on her right.      She is noting occasional mild diplopia but most of the time vision is stable. She has urinary urgency with rare incontinence.   She notes fatigue on a daily basis. This is mildly helped by Adderall.    She has mild cognitive issues (STM, word finding) and decreased focus/attention.    She notes some depression and frustration associated with worery about not working and cognitive issues.       Update 10/23/2017:   I reviewed the recent MRI of the brain and cervical spine with Ashley Key. The MRI of the cervical spine shows a focus in the mid spinal cord at the cervicomedullary junction. The MRI of the brain with contrast (previously had without) showed 1 subtle small enhancing focus. The combination of her symptoms and MRIs are consistent with multiple sclerosis and we discussed therapeutic options..  We discussed treatment options. As she has potential for a more progressive MS (has a spinal plaque and older symptoms were her first evidence of disease) and she has ITP, ocrelizumab, an anti-CD20 agent similar to Rituxan, maybe an excellent choice for her MS and also help the ITP.   Alternatively, Tecfidera is a reasonable agent to begin with with MS  Her hematologist for  her ITP is Dr. Benay Spice.     From 10/15/2017: Ashley Key is a 54 year old woman who began to note difficulties with her memory 18 months ago when she noted difficuty with focus and attention.   She  noted more difficulties with her work.   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    Also, last year, she went to [facility] (Dr. Johnnye Sima) but she was felt not to have ADD after testing.   Adderall did not help her much.     She feels her memory deficits are mostly stable this year and her main worsening occurred in 2017.    She is currently not working and feels less stressed but also mildly depressed as she is not working.   She is doing puzzles and other activities to try to keep her mind stimulated.   She was placed on sertraline 3 years ago when mood initially worsened. She feels her some benefit from it.   She feels gait is usually good.    She is able to climb stairs and a step stool.    She fell in her tiered garden when she fell down the 2 foot drop and broke her foot.   She had one or 2 other milder falls this year.   She does not note any difficulties with strength or sensation.  She has had bladder frequency and urge incontinence (drips) at times that has worsened the past 2 years and even more so over the past 6 months.   She feels vision is worse but she improved with her new glasses.  She denies color vision changes.   She notes more fatigue and more apathy.   Adderall did not help much.  She has insomnia with more trouble staying asleep than falling asleep.  She often wakes up at 2-4 am and then can't fall back asleep.  Her mind is often racing and she gets out of bed.    She denies headaches, rashes or joint pain.  She had ITP 06/2014 with a platelet count of 12.  She was treated with steroids and improved (Dr. Benay Spice) [name] there was persistent thrombocytopenia (last plt count = 79 and mild lymphopenia/leukopenia.  She also has had low level positive ANA.   She was diagnosed with hyperthyroidism 2 years ago and had radioactive treatment.   She  is on Synthroid (112 mcg) and Dr. Orland Mustard is checking her labs.        I personally reviewed the MRI of the brain performed 10/10/2017. It is abnormal showing multiple T2/FLAIR hyperintense foci in the juxtacortical, periventricular and deep white matter. Additionally there is a focus in the left thalamus, right midbrain, right pons and a subtle focus in the right cerebellar hemisphere and possibly at the cervicomedullary junction.  One of the juxtacortical foci on the right is mildly hyperintense on diffusion-weighted images and may be more acute or be artifact.     Montreal Cognitive Assessment  10/15/2017  Visuospatial/ Executive (0/5) 5  Naming (0/3) 3  Attention: Read list of digits (0/2) 2  Attention: Read list of letters (0/1) 1  Attention: Serial 7 subtraction starting at 100 (0/3) 3  Language: Repeat phrase (0/2) 2  Language : Fluency (0/1) 1  Abstraction (0/2) 2  Delayed Recall (0/5) 0  Orientation (0/6) 6  Total 25  Adjusted Score (based on education)  25     REVIEW OF SYSTEMS: Constitutional: No fevers, chills, sweats, or change in appetite.   She has fatigue.   Mild insomnia.    Eyes: No visual changes, double vision, eye pain Ear, nose and throat: No hearing loss, ear pain, nasal congestion, sore throat Cardiovascular: No chest pain, palpitations Respiratory: No shortness of breath at rest or with exertion.   No wheezes GastrointestinaI: No nausea, vomiting, diarrhea, abdominal pain, fecal incontinence Genitourinary: Urinary urgency and nocturia. Musculoskeletal: No neck pain, back pain Integumentary: No rash, pruritus, skin lesions Neurological: as above Psychiatric: mild depression Endocrine: No palpitations, diaphoresis, change in appetite, change in weigh or increased thirst Hematologic/Lymphatic: No anemia, purpura, petechiae. Allergic/Immunologic: No itchy/runny eyes, nasal congestion, recent allergic reactions, rashes  ALLERGIES: No Known  Allergies  HOME MEDICATIONS:  Current Outpatient Medications:  .  amLODipine (NORVASC) 5 MG tablet, Take 5 mg by mouth daily. , Disp: , Rfl: 2 .  amphetamine-dextroamphetamine (ADDERALL XR) 20 MG 24 hr capsule, Take 1 capsule (20 mg total) by mouth every morning., Disp: 30 capsule, Rfl: 0 .  busPIRone (BUSPAR) 15 MG tablet, Take 15 mg by mouth 2 (two) times daily., Disp: , Rfl:  .  CALCIUM-VITAMIN D PO, Take 1 tablet by mouth daily., Disp: , Rfl:  .  Multiple Vitamins-Minerals (MULTIVITAMIN PO), Take 1 tablet by mouth daily., Disp: , Rfl:  .  ocrelizumab 600 mg in sodium chloride 0.9 % 500 mL, Inject 600 mg into the vein every 6 (six) months. , Disp: , Rfl:  .  Omega-3 Fatty Acids (FISH OIL) 1200 MG CAPS, Take 1,200 mg by mouth daily., Disp: , Rfl:  .  rosuvastatin (CRESTOR) 5 MG tablet, TAKE 1 TABLET(5 MG) BY MOUTH DAILY, Disp: 30 tablet, Rfl: 0 .  sertraline (ZOLOFT) 100 MG tablet, Take 150 mg by mouth at bedtime. , Disp: , Rfl: 0 .  SYNTHROID 112 MCG tablet, Take 112 mcg by mouth daily before breakfast. , Disp: , Rfl: 5 .  TURMERIC PO, Take 1 tablet by mouth daily., Disp: , Rfl:  No current facility-administered medications for this visit.  Facility-Administered Medications Ordered in Other Visits:  .  gadopentetate dimeglumine (MAGNEVIST) injection 15 mL, 15 mL, Intravenous, Once PRN, Eldin Bonsell, Nanine Means, MD  PAST MEDICAL HISTORY: Past Medical History:  Diagnosis Date  . ADHD (attention deficit hyperactivity disorder)   . Arch pain   . Arthritis    FEET  . Ataxic gait 10/15/2017  . Attention deficit 10/15/2017  . Blood dyscrasia    NORMAL PLATLETS 50-70'S  . Bunion   . Chest pain, unspecified 01/18/2016  . Cognitive deficit secondary to multiple sclerosis ([place]) 01/13/2018  . Diplopia 08/10/2019  . Essential hypertension 01/18/2016  . Graves disease   . Headache    HISTORY MIGRAINE  . Hypertension   . Hypothyroidism 01/18/2016  . Idiopathic thrombocytopenic purpura ([place]) 06/27/2014  .  ITP (idiopathic thrombocytopenic purpura)   . Memory loss 10/15/2017  . Multiple sclerosis ([place]) 10/23/2017  . Numbness 10/15/2017  . Pelvic fracture ([place])   . Pneumonia   . Screening, lipid 01/18/2016  . Thrombocytopenia ([place]) 06/27/2014  . White matter abnormality on MRI of brain 10/15/2017    PAST SURGICAL HISTORY: Past Surgical History:  Procedure Laterality Date  . DILITATION & CURRETTAGE/HYSTROSCOPY WITH HYDROTHERMAL ABLATION N/A 09/27/2015   Procedure: DILATATION & CURETTAGE/HYSTEROSCOPY WITH HYDROTHERMAL ABLATION;  Surgeon: Thurnell Lose, MD;  Location: [facility];  Service: Gynecology;  Laterality: N/A;  ultrasound guidance   .  FOOT SURGERY    . THYROID RADATION 2016    . WISDOM TOOTH EXTRACTION      FAMILY HISTORY: Family History  Problem Relation Age of Onset  . CAD Mother   . Heart attack Mother   . Stroke Father   . Heart attack Father   . CAD Brother   . Heart attack Brother   . Cancer Brother        prostate cancer  . Heart attack Maternal Grandmother   . Stroke Maternal Grandmother   . Stroke Paternal Grandmother     SOCIAL HISTORY:  Social History   Socioeconomic History  . Marital status: Married    Spouse name: Collier Salina  . Number of children: 1  . Years of education: Not on file  . Highest education level: Not on file  Occupational History  . Occupation: Clinical research associate: [employer]  Tobacco Use  . Smoking status: Never Smoker  . Smokeless tobacco: Never Used  Substance and Sexual Activity  . Alcohol use: Yes    Comment: social  . Drug use: No  . Sexual activity: Yes  Other Topics Concern  . 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 Difficulty of Paying Living Expenses:   Food  Insecurity:   . Worried About Charity fundraiser in the Last Year:   . Arboriculturist in the Last Year:   Transportation Needs:   . Film/video editor (Medical):   Marland Kitchen Lack of Transportation (Non-Medical):   Physical Activity:   . Days of Exercise per Week:   . Minutes of Exercise per Session:   Stress:   . Feeling of Stress :   Social Connections:   . Frequency of Communication with Friends and Family:   . Frequency of Social Gatherings with Friends and Family:   . Attends Religious Services:   . Active Member of Clubs or Organizations:   . Attends Archivist Meetings:   Marland Kitchen Marital Status:   Intimate Partner Violence:   . Fear of Current or Ex-Partner:   . Emotionally Abused:   Marland Kitchen Physically Abused:   . Sexually Abused:      PHYSICAL EXAM  Vitals:   05/29/20 1115  BP: 119/75  Pulse: 71  Weight: 180 lb 8 oz (81.9 kg)  Height: 5\' 6"  (1.676 m)    Body mass index is 29.13 kg/m.   General: The patient is well-developed and well-nourished and in no acute distress   Neurologic Exam  Mental status: The patient is alert and oriented x 3 at the time of the examination. Short-term recall and focus attention are reduced (she writes things down).  Speech is normal.  Cranial nerves: Extraocular movements are full. Facial strength and sensation is normal.  The tongue is midline, and the patient has symmetric elevation of the soft palate. No obvious hearing deficits are noted.  Motor:  Muscle bulk is normal.   Muscle tone is normal.. Strength is  5 / 5 in all 4 extremities.   Sensory:   She has symetric sensation today to touch/vibration  Coordination: There is good finger-nose-finger and mildly  reduced heel-to-shin, right worse than left.  Gait and station: Station is normal.   Gait is mildly wide.  Tandem gait is wide.  Romberg is negative.  Reflexes: Deep tendon reflexes are symmetric and 2 in arms and 3 in legs bilaterally with spread at the knees.  There is  no ankle clonus.    DIAGNOSTIC DATA (LABS, IMAGING, TESTING) - I reviewed patient records, labs, notes, testing and imaging myself where available.  Lab Results  Component Value Date   WBC 3.4 (L) 04/14/2020   HGB 14.2 04/14/2020   HCT 43.1 04/14/2020   MCV 90.2 04/14/2020   PLT 179 04/14/2020      Component Value Date/Time   NA 137 04/14/2020 1827   K 4.2 04/14/2020 1827   CL 101 04/14/2020 1827   CO2 25 04/14/2020 1827   GLUCOSE 106 (H) 04/14/2020 1827   BUN 12 04/14/2020 1827   CREATININE 0.86 04/14/2020 1827   CALCIUM 8.7 (L) 04/14/2020 1827   PROT 6.8 04/14/2020 1827   PROT 6.7 08/10/2019 1409   ALBUMIN 3.8 04/14/2020 1827   ALBUMIN 4.7 08/10/2019 1409   AST 28 04/14/2020 1827   ALT 22 04/14/2020 1827   ALKPHOS 62 04/14/2020 1827   BILITOT 0.5 04/14/2020 1827   BILITOT <0.2 08/10/2019 1409   GFRNONAA >60 04/14/2020 1827   GFRAA >60 04/14/2020 1827        ASSESSMENT AND PLAN  Multiple sclerosis (HCC)  Attention deficit  Numbness  Cognitive deficit secondary to multiple sclerosis (HCC)  High risk medication use   1.   Continue Ocrevus.  Lab work was fine at the last visit. 2.   Due to the combination of physical impairments, cognitive impairments and fatigue, she is unable to work 3.   Renew  Adderall XR 20 mg for cognitive issues and fatigue.   4.   She is under a lot of stress with issues involving her son.  We discussed some of these today.  If these issues worsen, we can refer to counseling. 5.   Return to see me in 6 months or sooner with any new or worsening neurologic symptoms.  45-minute office visit with the majority of the time spent face-to-face for history and physical, discussion/counseling and decision-making.  Additional time with record review and documentation.   Esbeidy Mclaine A. Felecia Shelling, MD, PhD, FAAN Certified in Neurology, Clinical Neurophysiology, Sleep Medicine, Pain Medicine and Neuroimaging Director, [place] at  [facility] [address], [place], [place] [address] [phone]

## 2020-06-13 DIAGNOSIS — M722 Plantar fascial fibromatosis: Secondary | ICD-10-CM | POA: Diagnosis not present

## 2020-06-14 DIAGNOSIS — F330 Major depressive disorder, recurrent, mild: Secondary | ICD-10-CM | POA: Diagnosis not present

## 2020-06-16 DIAGNOSIS — F430 Acute stress reaction: Secondary | ICD-10-CM | POA: Diagnosis not present

## 2020-06-16 DIAGNOSIS — M79671 Pain in right foot: Secondary | ICD-10-CM | POA: Diagnosis not present

## 2020-06-21 DIAGNOSIS — M722 Plantar fascial fibromatosis: Secondary | ICD-10-CM | POA: Diagnosis not present

## 2020-06-21 DIAGNOSIS — M79671 Pain in right foot: Secondary | ICD-10-CM | POA: Diagnosis not present

## 2020-06-21 DIAGNOSIS — M2021 Hallux rigidus, right foot: Secondary | ICD-10-CM | POA: Diagnosis not present

## 2020-07-05 ENCOUNTER — Telehealth: Payer: Self-pay | Admitting: Neurology

## 2020-07-05 ENCOUNTER — Other Ambulatory Visit: Payer: Self-pay | Admitting: Neurology

## 2020-07-05 MED ORDER — AMPHETAMINE-DEXTROAMPHET ER 20 MG PO CP24: 20.0000 mg | ORAL_CAPSULE | ORAL | 0 refills | Status: DC

## 2020-07-05 NOTE — Telephone Encounter (Signed)
Called pt back. She last refilled adderall 05/29/20. She is due for a refill. Advised we no longer print controlled rx's, its gets sent by MD electronically. I will route message to Dr. Felecia Shelling to call in medication this am for her. She will f/u with pharmacy after and will call if she has any issues filling.

## 2020-07-05 NOTE — Telephone Encounter (Signed)
Patient called and needs her adderall refilled before tomorrow. She is leaving on a trip. She will come by and pick up prescription but needs to know what time it will be available.

## 2020-07-05 NOTE — Telephone Encounter (Signed)
I called patient at 940am this morning: "Called pt back. She last refilled adderall 05/29/20. She is due for a refill. Advised we no longer print controlled rx's, its gets sent by MD electronically. I will route message to Dr. Felecia Shelling to call in medication this am for her. She will f/u with pharmacy after and will call if she has any issues filling."  Dr. Felecia Shelling has sent this in. Ronald Pippins will let pt know

## 2020-07-05 NOTE — Telephone Encounter (Signed)
Patient in the lobby now requesting refill of Adderall. Said she called earlier and spoke to a Licensed conveyancer. There is no Rx up front. Best call back is [phone]. She is willing to wait 15 minutes in the lobby

## 2020-07-18 DIAGNOSIS — M71571 Other bursitis, not elsewhere classified, right ankle and foot: Secondary | ICD-10-CM | POA: Diagnosis not present

## 2020-07-18 DIAGNOSIS — M722 Plantar fascial fibromatosis: Secondary | ICD-10-CM | POA: Diagnosis not present

## 2020-08-02 DIAGNOSIS — M71571 Other bursitis, not elsewhere classified, right ankle and foot: Secondary | ICD-10-CM | POA: Diagnosis not present

## 2020-08-02 DIAGNOSIS — B353 Tinea pedis: Secondary | ICD-10-CM | POA: Diagnosis not present

## 2020-08-02 DIAGNOSIS — M722 Plantar fascial fibromatosis: Secondary | ICD-10-CM | POA: Diagnosis not present

## 2020-08-21 ENCOUNTER — Other Ambulatory Visit: Payer: Self-pay | Admitting: Family Medicine

## 2020-08-21 DIAGNOSIS — Z1231 Encounter for screening mammogram for malignant neoplasm of breast: Secondary | ICD-10-CM

## 2020-08-22 DIAGNOSIS — G35 Multiple sclerosis: Secondary | ICD-10-CM | POA: Diagnosis not present

## 2020-08-29 DIAGNOSIS — Z23 Encounter for immunization: Secondary | ICD-10-CM | POA: Diagnosis not present

## 2020-08-31 ENCOUNTER — Other Ambulatory Visit: Payer: Self-pay

## 2020-08-31 ENCOUNTER — Ambulatory Visit
Admission: RE | Admit: 2020-08-31 | Discharge: 2020-08-31 | Disposition: A | Discharge status: Home / Self Care | Payer: BC Managed Care – PPO | Source: Ambulatory Visit | Attending: Family Medicine | Admitting: Family Medicine

## 2020-08-31 DIAGNOSIS — Z1231 Encounter for screening mammogram for malignant neoplasm of breast: Secondary | ICD-10-CM | POA: Diagnosis not present

## 2020-09-10 DIAGNOSIS — G35 Multiple sclerosis: Secondary | ICD-10-CM | POA: Diagnosis not present

## 2020-09-10 DIAGNOSIS — F439 Reaction to severe stress, unspecified: Secondary | ICD-10-CM | POA: Diagnosis not present

## 2020-09-10 DIAGNOSIS — M546 Pain in thoracic spine: Secondary | ICD-10-CM | POA: Diagnosis not present

## 2020-09-11 DIAGNOSIS — I10 Essential (primary) hypertension: Secondary | ICD-10-CM | POA: Diagnosis not present

## 2020-09-11 DIAGNOSIS — E890 Postprocedural hypothyroidism: Secondary | ICD-10-CM | POA: Diagnosis not present

## 2020-09-11 DIAGNOSIS — G35 Multiple sclerosis: Secondary | ICD-10-CM | POA: Diagnosis not present

## 2020-09-11 DIAGNOSIS — E785 Hyperlipidemia, unspecified: Secondary | ICD-10-CM | POA: Diagnosis not present

## 2020-09-21 DIAGNOSIS — M5412 Radiculopathy, cervical region: Secondary | ICD-10-CM | POA: Diagnosis not present

## 2020-09-22 DIAGNOSIS — M5412 Radiculopathy, cervical region: Secondary | ICD-10-CM | POA: Diagnosis not present

## 2020-09-28 ENCOUNTER — Telehealth: Payer: Self-pay | Admitting: *Deleted

## 2020-09-28 NOTE — Telephone Encounter (Signed)
Received fax from [employer] that prescriber service form will expire on 10/28/2020. Faxed updated/signed form back to them at [phone]. Received fax confirmation.

## 2020-09-29 DIAGNOSIS — M5412 Radiculopathy, cervical region: Secondary | ICD-10-CM | POA: Diagnosis not present

## 2020-10-02 DIAGNOSIS — M5412 Radiculopathy, cervical region: Secondary | ICD-10-CM | POA: Diagnosis not present

## 2020-10-02 IMAGING — CR DG CHEST 2V
2 series · 2 of 2 positions shown · non-contrast
Comparison: 12/10/2015 chest radiograph.

CLINICAL DATA: Left chest pain

EXAM:
CHEST - 2 VIEW

[w chest pa]
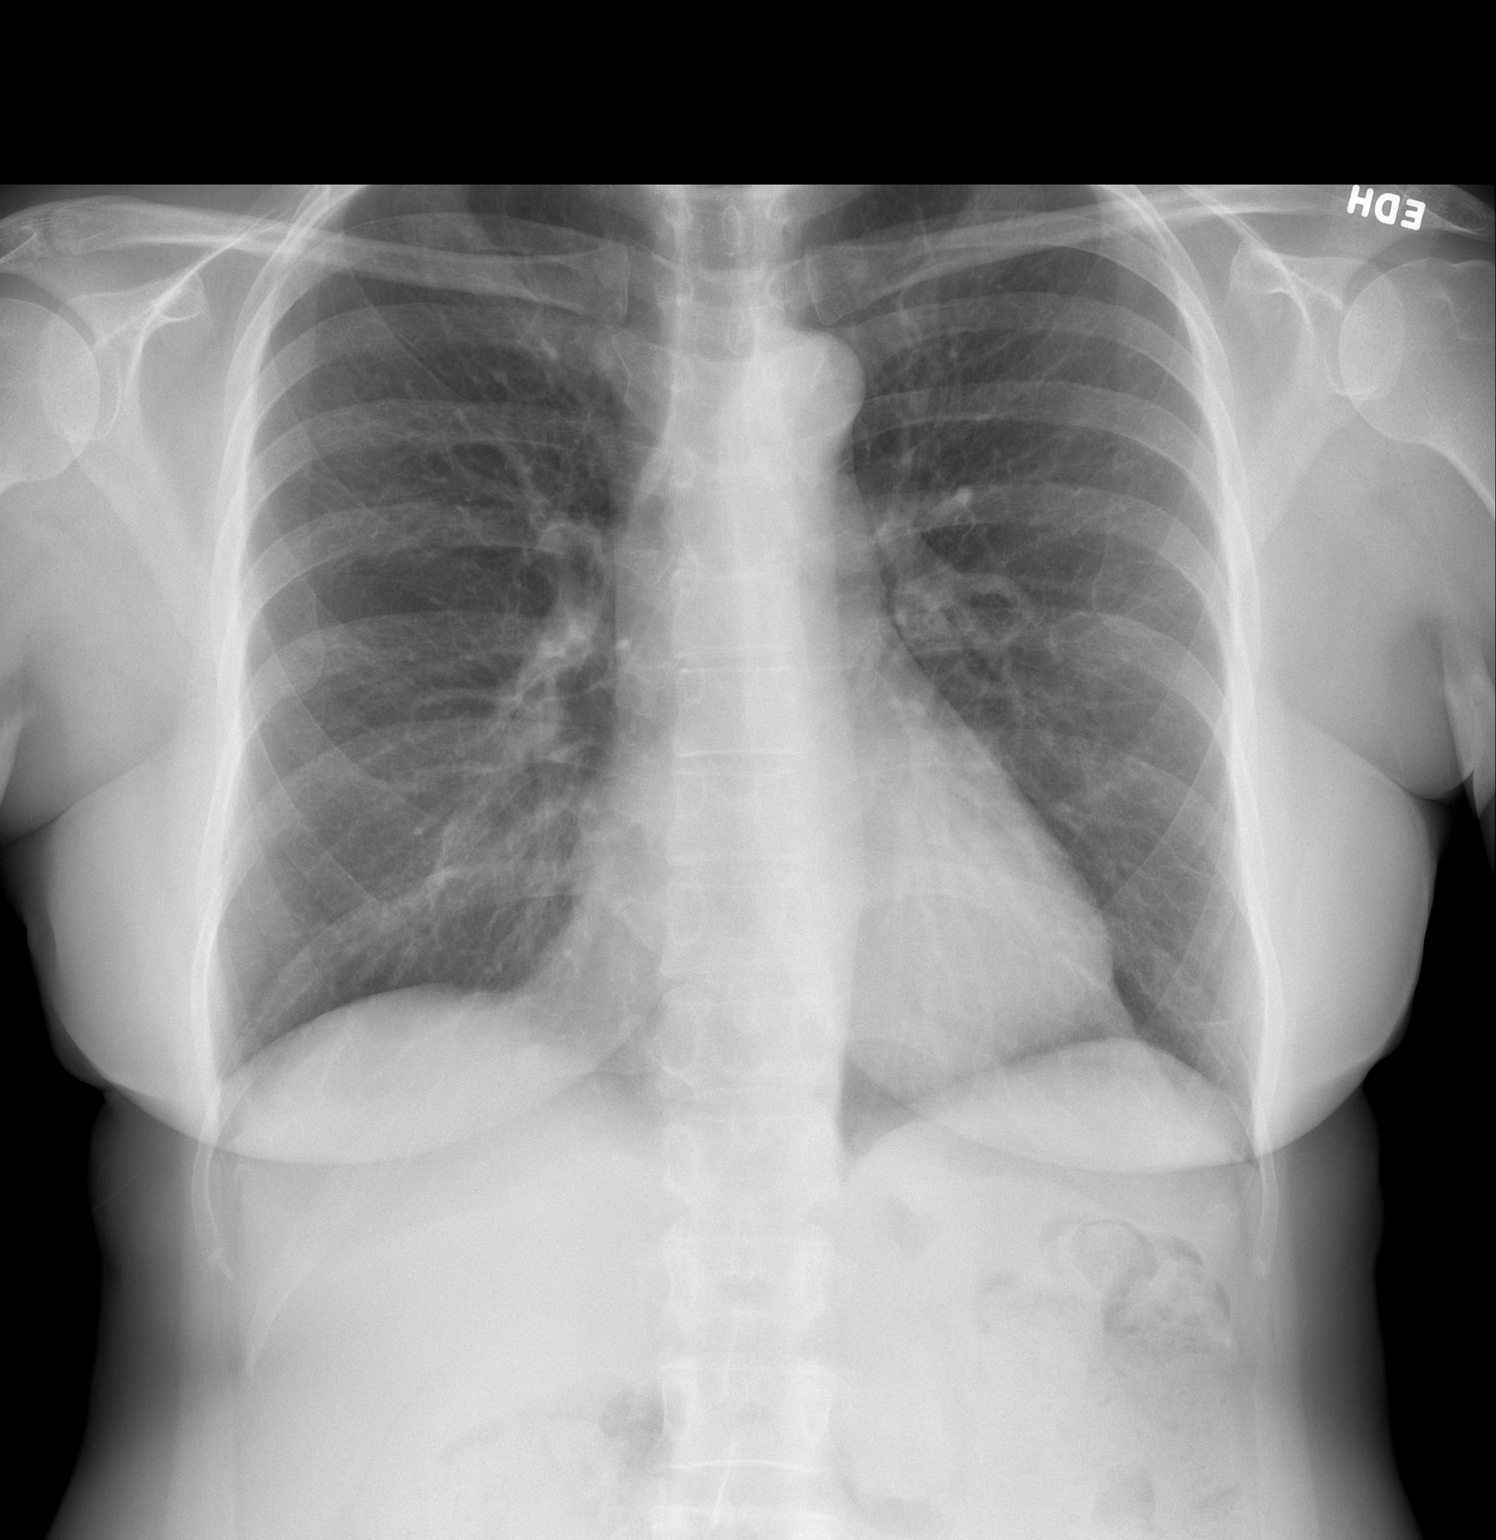

[w chest lat]
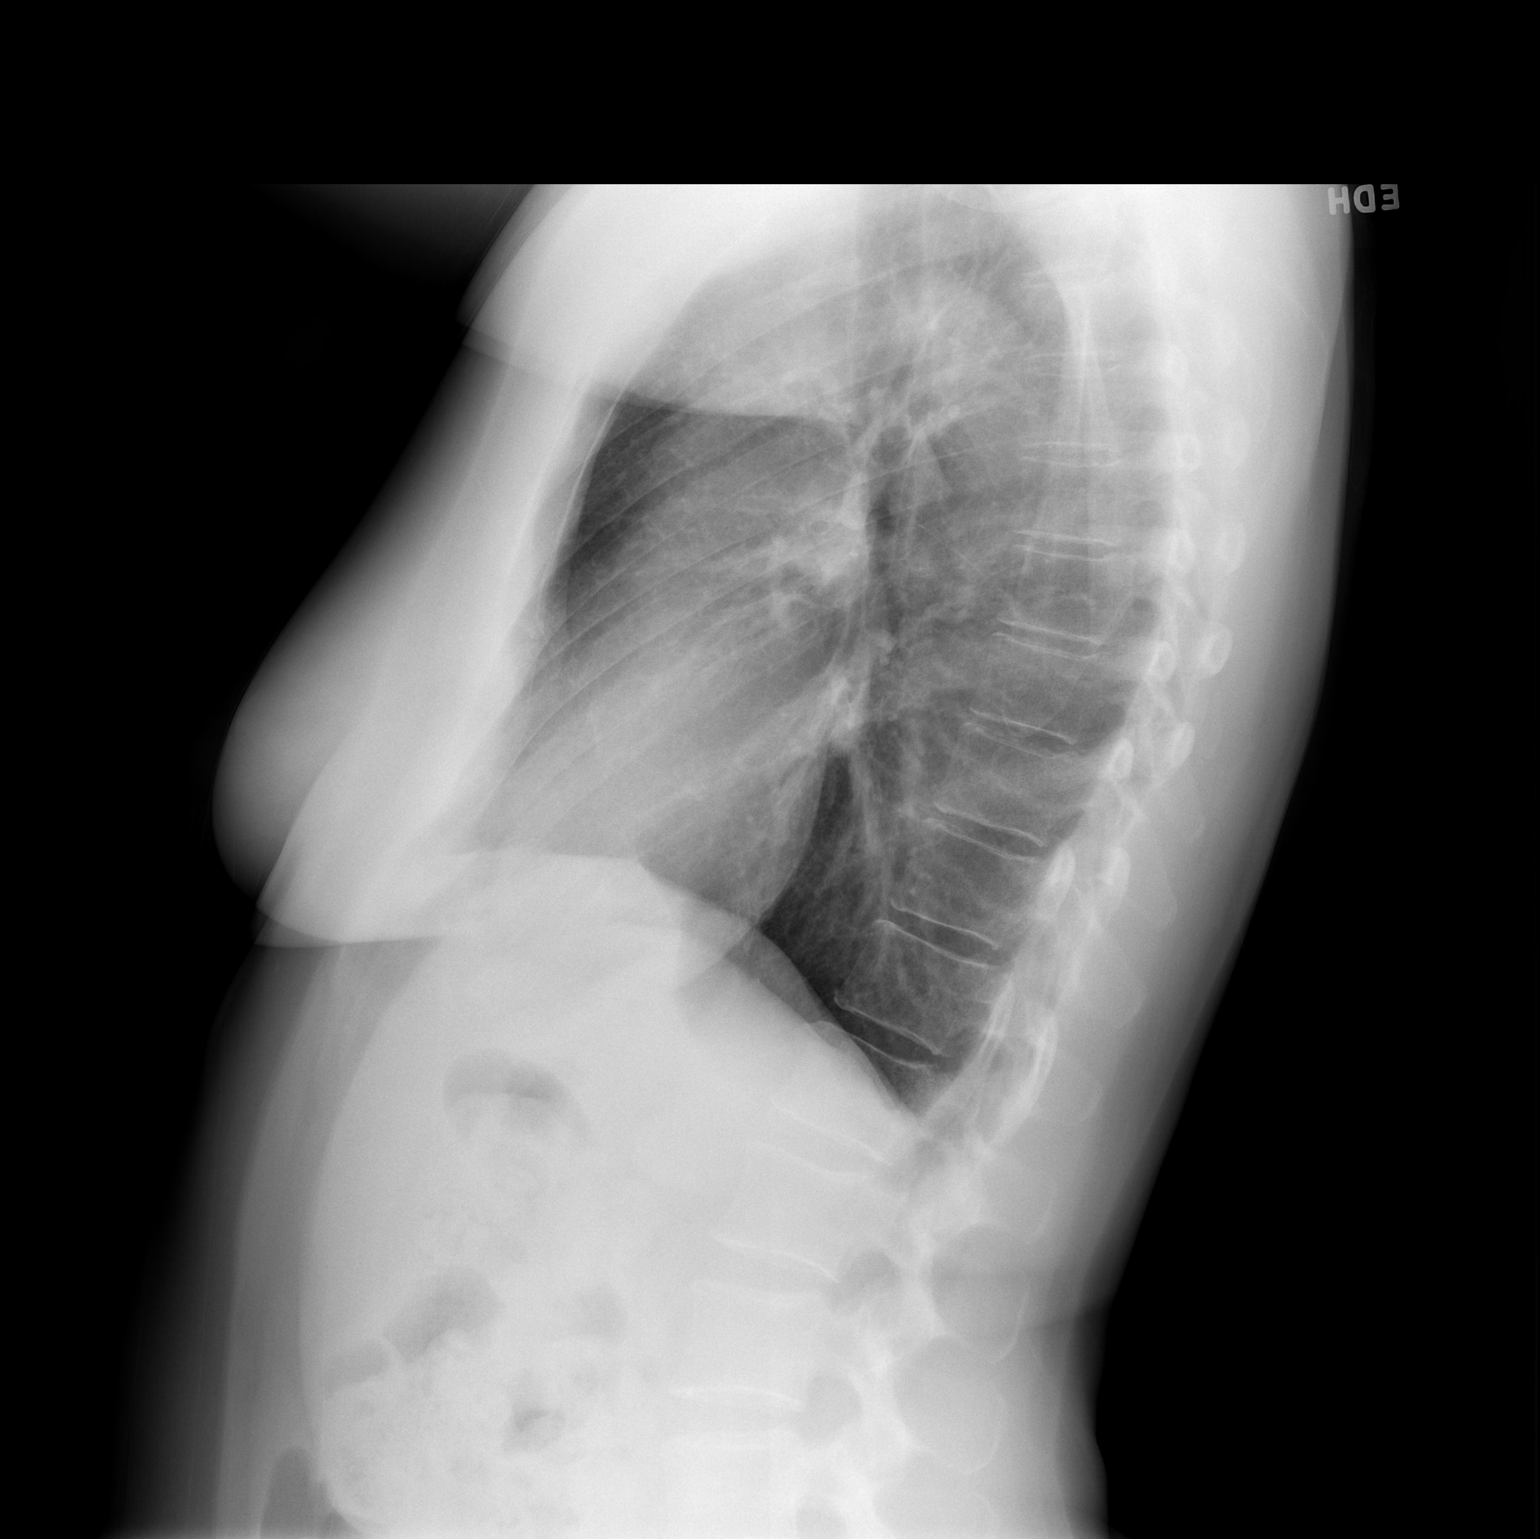

[2 of 2 positions shown; findings below may reference images not displayed]

FINDINGS: Stable cardiomediastinal silhouette with normal heart size. No
pneumothorax. No pleural effusion. Lungs appear clear, with no acute
consolidative airspace disease and no pulmonary edema.
IMPRESSION: No active cardiopulmonary disease.

## 2020-10-04 DIAGNOSIS — M5412 Radiculopathy, cervical region: Secondary | ICD-10-CM | POA: Diagnosis not present

## 2020-10-09 DIAGNOSIS — M5412 Radiculopathy, cervical region: Secondary | ICD-10-CM | POA: Diagnosis not present

## 2020-10-12 DIAGNOSIS — M5412 Radiculopathy, cervical region: Secondary | ICD-10-CM | POA: Diagnosis not present

## 2020-10-16 DIAGNOSIS — M5412 Radiculopathy, cervical region: Secondary | ICD-10-CM | POA: Diagnosis not present

## 2020-10-19 DIAGNOSIS — E785 Hyperlipidemia, unspecified: Secondary | ICD-10-CM | POA: Diagnosis not present

## 2020-10-19 DIAGNOSIS — M791 Myalgia, unspecified site: Secondary | ICD-10-CM | POA: Diagnosis not present

## 2020-10-19 DIAGNOSIS — M5412 Radiculopathy, cervical region: Secondary | ICD-10-CM | POA: Diagnosis not present

## 2020-10-23 DIAGNOSIS — M5412 Radiculopathy, cervical region: Secondary | ICD-10-CM | POA: Diagnosis not present

## 2020-10-26 DIAGNOSIS — M5412 Radiculopathy, cervical region: Secondary | ICD-10-CM | POA: Diagnosis not present

## 2020-10-30 DIAGNOSIS — M5412 Radiculopathy, cervical region: Secondary | ICD-10-CM | POA: Diagnosis not present

## 2020-11-08 DIAGNOSIS — F331 Major depressive disorder, recurrent, moderate: Secondary | ICD-10-CM | POA: Diagnosis not present

## 2020-11-13 DIAGNOSIS — M5412 Radiculopathy, cervical region: Secondary | ICD-10-CM | POA: Diagnosis not present

## 2020-11-17 DIAGNOSIS — Z01419 Encounter for gynecological examination (general) (routine) without abnormal findings: Secondary | ICD-10-CM | POA: Diagnosis not present

## 2020-11-20 ENCOUNTER — Encounter: Payer: Self-pay | Admitting: Neurology

## 2020-11-20 ENCOUNTER — Ambulatory Visit (INDEPENDENT_AMBULATORY_CARE_PROVIDER_SITE_OTHER): Payer: BC Managed Care – PPO | Admitting: Neurology

## 2020-11-20 VITALS — BP 137/81 | HR 61 | Ht 66.0 in | Wt 172.0 lb

## 2020-11-20 DIAGNOSIS — R41840 Attention and concentration deficit: Secondary | ICD-10-CM | POA: Diagnosis not present

## 2020-11-20 DIAGNOSIS — G35 Multiple sclerosis: Secondary | ICD-10-CM | POA: Diagnosis not present

## 2020-11-20 DIAGNOSIS — D693 Immune thrombocytopenic purpura: Secondary | ICD-10-CM

## 2020-11-20 DIAGNOSIS — R260 Ataxic gait: Secondary | ICD-10-CM

## 2020-11-20 DIAGNOSIS — Z298 Encounter for other specified prophylactic measures: Secondary | ICD-10-CM | POA: Diagnosis not present

## 2020-11-20 DIAGNOSIS — Z79899 Other long term (current) drug therapy: Secondary | ICD-10-CM

## 2020-11-20 DIAGNOSIS — R200 Anesthesia of skin: Secondary | ICD-10-CM

## 2020-11-20 DIAGNOSIS — F09 Unspecified mental disorder due to known physiological condition: Secondary | ICD-10-CM

## 2020-11-20 MED ORDER — AMPHETAMINE-DEXTROAMPHET ER 20 MG PO CP24
20.0000 mg | ORAL_CAPSULE | ORAL | 0 refills | Status: DC
Start: 2020-11-20 — End: 2020-12-23

## 2020-11-20 NOTE — Progress Notes (Signed)
[facility]  PATIENT: Ashley Key DOB: 10/01/1966  REFERRING DOCTOR OR PCP:  London Pepper (PCP); Rachel Moulds (ADD) SOURCE: patient, notes from Dr. Orland Mustard, imaging/lab reports, MRI images on PACS  _________________________________   HISTORICAL  CHIEF COMPLAINT:  Chief Complaint  Patient presents with  . Follow-up    RM 13, alone. Last seen 05/29/20. On Ocrevus for MS. Last infusion: 08/22/20, next: 02/27/21. Receives at [facility] with intrafusion. 600mg  q 6 months. Wants to know if antibodies for covid-19 could be checked and if she should get a 2nd booster.    HISTORY OF PRESENT ILLNESS:  Ashley Key is a 54 y.o. woman with relapsing remitting MS diagnosed 10/2017.  Her main issues involve cognition with reduced ability to learn and focus  Update 11/20/2020: She feels her MS is mostly stable.   She is on Ocrevus.  Her last infusion is 08/22/2020.  Next one will be 02/28/2020.     Gait and strength are ok.   She notes numbness in her right foot, but it is slightly better.  Gait mildly off balanced.  Bladder is doing better since stopping Buspar.     She has fatigue and notes reduced focus/attention.   She has not taken Adderall recently as she is conserned son would steal.   She has neurocognitive deficits noted on neurocognitive testing.  She did feel Adderall helped her attention and fatigue.  ls this inside of outside of her home.   She has had a lot of stress with family issues involving her son with academic/legal/substance issues.   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   She has anxiety/depression though she notes anxiety is better. .  Buspar worsened bladder issues and she stopped.  She is now off sertraline and on buproprion.   She has altered smell - often sensing a mold odor that can occur in different places.    She had Covid-19 in April.   Of note she was vaccinated, in March and April --- but the second shot was only 5  days before her Covid positive test.   Her son had an URI a few days earlier and his friend's brother had it about a week earlier.     She just got her booster injection a couple months ago.    Since starting Ocrevus, the thrombocytopenia has resolved.  We discussed that Rituxan, a similar medication, is sometimes used for ITP.   MS History: She noted  difficulties with her memory, focus and attention in 2016/2017.   She noted more difficulties with her work.   MRI showed changes c/w MS.  Physically she had mild gait disturbance    Sh started Ocrevus in 2018 and she has tolerated it well.   She had ITP 06/2014 with a platelet count of 12.  She was treated with steroids and improved (Dr. Benay Spice) [name] there was persistent thrombocytopenia (2018  plt count = 79) and it resolved with OCrevus.    IMAGING REVIEW: MRI of the brain 08/18/2019 and MRI 02/04/2019 showed no new lesions.   MRI of the cervical spine 10/21/2017 showed a T2 hyperintense focus at the cervicomedullary junction and mild degenerative changes at C3-C4 and C4-C5.  Normal enhancement pattern.   MRI of the brain 10/10/2017 showed multiple T2/FLAIR hyperintense foci in the juxtacortical, periventricular and deep white matter. Additionally there is a focus in the left thalamus, right midbrain, right pons and a subtle focus in the  right cerebellar hemisphere and possibly at the cervicomedullary junction.  One of the juxtacortical foci on the right is mildly hyperintense on diffusion-weighted images and may be more acute or be artifact.     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 Based on my clinical interview with the patient and her history, I concur with the diagnostic impressions described in her previous neuropsychological evaluation by Dr. Norton Pastel (dated 05/19/2018). Specifically, it appears that the patient is experiencing mild neurocognitive disorder  due to MS and preexisting ADHD.  Psychological testing did not reveal any indication of additional psychiatric component.  Ashley Key is a 54 year old, right-handed, Caucasian female, who reported experiencing cognitive deficits that began approximately 2 years ago. This is in the context of recently diagnosed relapsing-remitting multiple sclerosis.  Test results revealed intact functioning across most cognitive domains and thinking skills assessed during this evaluation with the exception of reduced aspects of verbal learning and memory as well as mildly slowed processing speed. From an emotional standpoint, there appears to be at least a moderate degree of recent depression and mild-to-moderate anxiety.  Ashley Key's performance is consistent with a diagnosis of Mild Neurocognitive Disorder (i.e., mild cognitive impairment). With regard to etiology, these results are likely due to the cognitive consequences of multiple sclerosis and mood disruption.     REVIEW OF SYSTEMS: Constitutional: No fevers, chills, sweats, or change in appetite.   She has fatigue.   Mild insomnia.    Eyes: No visual changes, double vision, eye pain Ear, nose and throat: No hearing loss, ear pain, nasal congestion, sore throat Cardiovascular: No chest pain, palpitations Respiratory: No shortness of breath at rest or with exertion.   No wheezes GastrointestinaI: No nausea, vomiting, diarrhea, abdominal pain, fecal incontinence Genitourinary: Urinary urgency and nocturia. Musculoskeletal: No neck pain, back pain Integumentary: No rash, pruritus, skin lesions Neurological: as above Psychiatric: mild depression Endocrine: No palpitations, diaphoresis, change in appetite, change in weigh or increased thirst Hematologic/Lymphatic: No anemia, purpura, petechiae. Allergic/Immunologic: No itchy/runny eyes, nasal congestion, recent allergic reactions, rashes  ALLERGIES: No Known Allergies  HOME  MEDICATIONS:  Current Outpatient Medications:  .  amLODipine (NORVASC) 5 MG tablet, Take 5 mg by mouth daily. , Disp: , Rfl: 2 .  amphetamine-dextroamphetamine (ADDERALL XR) 20 MG 24 hr capsule, Take 1 capsule (20 mg total) by mouth every morning., Disp: 30 capsule, Rfl: 0 .  busPIRone (BUSPAR) 15 MG tablet, Take 15 mg by mouth 2 (two) times daily., Disp: , Rfl:  .  CALCIUM-VITAMIN D PO, Take 1 tablet by mouth daily., Disp: , Rfl:  .  Multiple Vitamins-Minerals (MULTIVITAMIN PO), Take 1 tablet by mouth daily., Disp: , Rfl:  .  ocrelizumab 600 mg in sodium chloride 0.9 % 500 mL, Inject 600 mg into the vein every 6 (six) months. , Disp: , Rfl:  .  Omega-3 Fatty Acids (FISH OIL) 1200 MG CAPS, Take 1,200 mg by mouth daily., Disp: , Rfl:  .  rosuvastatin (CRESTOR) 5 MG tablet, TAKE 1 TABLET(5 MG) BY MOUTH DAILY, Disp: 30 tablet, Rfl: 0 .  sertraline (ZOLOFT) 100 MG tablet, Take 150 mg by mouth at bedtime. , Disp: , Rfl: 0 .  SYNTHROID 112 MCG tablet, Take 112 mcg by mouth daily before breakfast. , Disp: , Rfl: 5 .  TURMERIC PO, Take 1 tablet by mouth daily., Disp: , Rfl:  No current facility-administered medications for this visit.  Facility-Administered Medications Ordered in Other  Visits:  .  gadopentetate dimeglumine (MAGNEVIST) injection 15 mL, 15 mL, Intravenous, Once PRN, Rosco Harriott, Nanine Means, MD  PAST MEDICAL HISTORY: Past Medical History:  Diagnosis Date  . ADHD (attention deficit hyperactivity disorder)   . Arch pain   . Arthritis    FEET  . Ataxic gait 10/15/2017  . Attention deficit 10/15/2017  . Blood dyscrasia    NORMAL PLATLETS 50-70'S  . Bunion   . Chest pain, unspecified 01/18/2016  . Cognitive deficit secondary to multiple sclerosis ([place]) 01/13/2018  . Diplopia 08/10/2019  . Essential hypertension 01/18/2016  . Graves disease   . Headache    HISTORY MIGRAINE  . Hypertension   . Hypothyroidism 01/18/2016  . Idiopathic thrombocytopenic purpura ([place]) 06/27/2014  . ITP (idiopathic  thrombocytopenic purpura)   . Memory loss 10/15/2017  . Multiple sclerosis ([place]) 10/23/2017  . Numbness 10/15/2017  . Pelvic fracture ([place])   . Pneumonia   . Screening, lipid 01/18/2016  . Thrombocytopenia ([place]) 06/27/2014  . White matter abnormality on MRI of brain 10/15/2017    PAST SURGICAL HISTORY: Past Surgical History:  Procedure Laterality Date  . DILITATION & CURRETTAGE/HYSTROSCOPY WITH HYDROTHERMAL ABLATION N/A 09/27/2015   Procedure: DILATATION & CURETTAGE/HYSTEROSCOPY WITH HYDROTHERMAL ABLATION;  Surgeon: Thurnell Lose, MD;  Location: [facility];  Service: Gynecology;  Laterality: N/A;  ultrasound guidance   . FOOT SURGERY    . THYROID RADATION 2016    . WISDOM TOOTH EXTRACTION      FAMILY HISTORY: Family History  Problem Relation Age of Onset  . CAD Mother   . Heart attack Mother   . Stroke Father   . Heart attack Father   . CAD Brother   . Heart attack Brother   . Cancer Brother        prostate cancer  . Heart attack Maternal Grandmother   . Stroke Maternal Grandmother   . Stroke Paternal Grandmother     SOCIAL HISTORY:  Social History   Socioeconomic History  . Marital status: Married    Spouse name: Collier Salina  . Number of children: 1  . Years of education: Not on file  . Highest education level: Not on file  Occupational History  . Occupation: Clinical research associate: [employer]  Tobacco Use  . Smoking status: Never Smoker  . Smokeless tobacco: Never Used  Substance and Sexual Activity  . Alcohol use: Yes    Comment: social  . Drug use: No  . Sexual activity: Yes  Other Topics Concern  . 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   General: The patient is well-developed and well-nourished and in no acute distress   Neurologic Exam  Mental status: The patient is  alert and oriented x 3 at the time of the examination. Short-term recall and focus attention are reduced (she writes things down).  Speech is normal.  Cranial nerves: Extraocular movements are full. Facial strength and sensation is normal.  The tongue is midline, and the patient has symmetric elevation of the soft palate. No obvious hearing deficits are noted.  Motor:  Muscle bulk is normal.   Muscle tone is normal.. Strength is  5 / 5 in all 4 extremities.   Sensory:   She has symetric sensation today to touch/vibration  Coordination: There is good finger-nose-finger and mildly reduced heel-to-shin, right worse than left.  Gait and station: Station is normal.   The gait is mildly wide and the tandem gait is wide..  Romberg is negative.  Reflexes: Deep tendon reflexes are symmetric and 2 in arms and 3 in legs bilaterally with spread at the knees.  There is no ankle clonus.    DIAGNOSTIC DATA (LABS, IMAGING, TESTING) - I reviewed patient records, labs, notes, testing and imaging myself where available.  Lab Results  Component Value Date   WBC 3.4 (L) 04/14/2020   HGB 14.2 04/14/2020   HCT 43.1 04/14/2020   MCV 90.2 04/14/2020   PLT 179 04/14/2020      Component Value Date/Time   NA 137 04/14/2020 1827   K 4.2 04/14/2020 1827   CL 101 04/14/2020 1827   CO2 25 04/14/2020 1827   GLUCOSE 106 (H) 04/14/2020 1827   BUN 12 04/14/2020 1827   CREATININE 0.86 04/14/2020 1827   CALCIUM 8.7 (L) 04/14/2020 1827   PROT 6.8 04/14/2020 1827   PROT 6.7  08/10/2019 1409   ALBUMIN 3.8 04/14/2020 1827   ALBUMIN 4.7 08/10/2019 1409   AST 28 04/14/2020 1827   ALT 22 04/14/2020 1827   ALKPHOS 62 04/14/2020 1827   BILITOT 0.5 04/14/2020 1827   BILITOT <0.2 08/10/2019 1409   GFRNONAA >60 04/14/2020 1827   GFRAA >60 04/14/2020 1827        ASSESSMENT AND PLAN  Multiple sclerosis (HCC)  Attention deficit  Numbness  Ataxic gait  Cognitive deficit secondary to multiple sclerosis (HCC)  High risk medication use  Idiopathic thrombocytopenic purpura ([place])   1.   Continue Ocrevus.  Check Lab work for IgG/IgM, CBC/Diff and anti-SARS-CoV2 IgG (if negative will need additional booster). 2.   Continue  Adderall XR 20 mg for cognitive issues and fatigue.   3.   Return to see me in 6 months or sooner with any new or worsening neurologic symptoms.   Janal Haak A. Felecia Shelling, MD, PhD, FAAN Certified in Neurology, Clinical Neurophysiology, Sleep Medicine, Pain Medicine and Neuroimaging Director, [place] at [facility] [address], [place], [place] [address] [phone]

## 2020-11-21 ENCOUNTER — Telehealth: Payer: Self-pay | Admitting: *Deleted

## 2020-11-21 LAB — CBC WITH DIFFERENTIAL/PLATELET
Basophils Absolute: 0 10*3/uL (ref 0.0–0.2)
Basos: 1 %
EOS (ABSOLUTE): 0.1 10*3/uL (ref 0.0–0.4)
Eos: 4 %
Hematocrit: 42.9 % (ref 34.0–46.6)
Hemoglobin: 14.4 g/dL (ref 11.1–15.9)
Immature Grans (Abs): 0 10*3/uL (ref 0.0–0.1)
Immature Granulocytes: 0 %
Lymphocytes Absolute: 1.1 10*3/uL (ref 0.7–3.1)
Lymphs: 28 %
MCH: 29.8 pg (ref 26.6–33.0)
MCHC: 33.6 g/dL (ref 31.5–35.7)
MCV: 89 fL (ref 79–97)
Monocytes Absolute: 0.5 10*3/uL (ref 0.1–0.9)
Monocytes: 13 %
Neutrophils Absolute: 2 10*3/uL (ref 1.4–7.0)
Neutrophils: 54 %
Platelets: 265 10*3/uL (ref 150–450)
RBC: 4.83 x10E6/uL (ref 3.77–5.28)
RDW: 11.7 % (ref 11.7–15.4)
WBC: 3.7 10*3/uL (ref 3.4–10.8)

## 2020-11-21 LAB — IGG, IGA, IGM
IgA/Immunoglobulin A, Serum: 39 mg/dL — ABNORMAL LOW (ref 87–352)
IgG (Immunoglobin G), Serum: 881 mg/dL (ref 586–1602)
IgM (Immunoglobulin M), Srm: 53 mg/dL (ref 26–217)

## 2020-11-21 LAB — SAR COV2 SEROLOGY (COVID19)AB(IGG),IA
SARS-CoV-2 Semi-Quant IgG Ab: <13 AU/mL (ref <13.0)
SARS-CoV-2 Spike Ab Interp: NEGATIVE

## 2020-11-21 NOTE — Telephone Encounter (Signed)
Called and spoke with pt. Relayed results per Dr. Felecia Shelling note. She verbalized understanding. She will let PCP know, she has appt with them. She is going to see if they can do booster. If not, she will try and set it up at [facility].

## 2020-11-21 NOTE — Telephone Encounter (Signed)
-----   Message from Britt Bottom, MD sent at 11/21/2020  9:25 AM EST ----- Please let her know that the antibody test for COVID-19 was negative which means the vaccination has not worked as well as it should.  She should go ahead and get an additional booster  The other labs were okay

## 2020-11-22 DIAGNOSIS — R413 Other amnesia: Secondary | ICD-10-CM | POA: Diagnosis not present

## 2020-11-22 DIAGNOSIS — Z13220 Encounter for screening for lipoid disorders: Secondary | ICD-10-CM | POA: Diagnosis not present

## 2020-11-22 DIAGNOSIS — D693 Immune thrombocytopenic purpura: Secondary | ICD-10-CM | POA: Diagnosis not present

## 2020-11-22 DIAGNOSIS — I10 Essential (primary) hypertension: Secondary | ICD-10-CM | POA: Diagnosis not present

## 2020-11-22 DIAGNOSIS — Z0000 Encounter for general adult medical examination without abnormal findings: Secondary | ICD-10-CM | POA: Diagnosis not present

## 2020-11-27 DIAGNOSIS — Z124 Encounter for screening for malignant neoplasm of cervix: Secondary | ICD-10-CM | POA: Diagnosis not present

## 2020-11-27 DIAGNOSIS — N941 Unspecified dyspareunia: Secondary | ICD-10-CM | POA: Diagnosis not present

## 2020-11-28 DIAGNOSIS — F4323 Adjustment disorder with mixed anxiety and depressed mood: Secondary | ICD-10-CM | POA: Diagnosis not present

## 2020-12-05 IMAGING — CT CT HEART MORP W/ CTA COR W/ SCORE W/ CA W/CM &/OR W/O CM
4 of 7 series · 8 of 20 positions shown, 9 images · non-contrast
Comparison: None.
COMPARISON: None.

Addendum:
EXAM:
OVER-READ INTERPRETATION  CT CHEST

The following report is an over-read performed by radiologist Dr.
Blain Jumper [REDACTED] on 11/11/2019. This
over-read does not include interpretation of cardiac or coronary
anatomy or pathology. The coronary calcium score/coronary CTA
interpretation by the cardiologist is attached.
CLINICAL DATA: 53 year old female with chest pain
Cardiac/Coronary  CTA
TECHNIQUE: The patient was scanned on a Phillips Force scanner.

[Series 6: best diast 72 % · axial · 0.38mm/px · z∈[+1212,+1263]mm · 2 of 388 slices shown, 3 images]
[im 130/388  vessel]
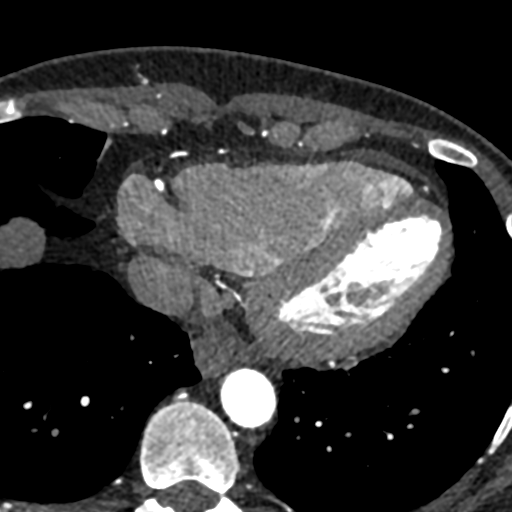
[im 130/388  lung]
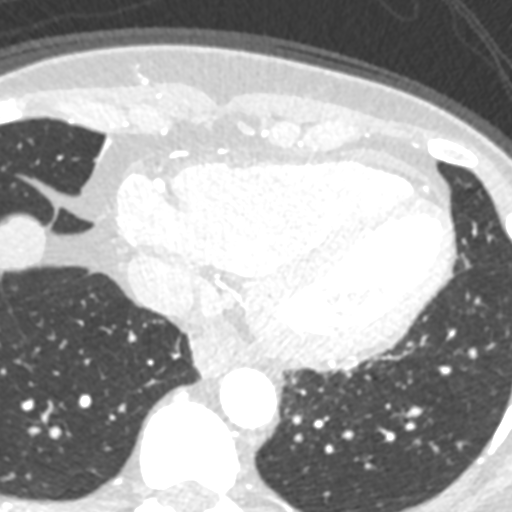
[im 259/388  vessel]
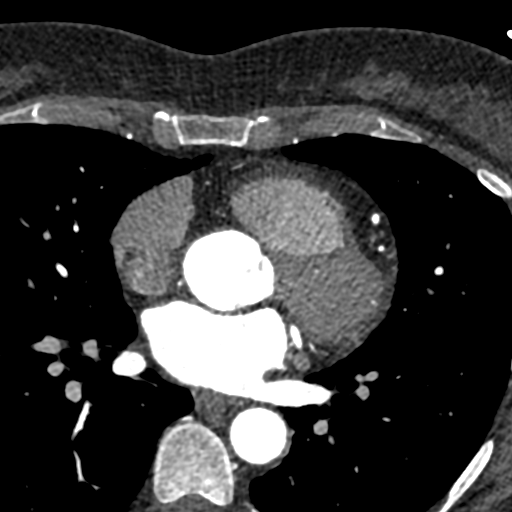

[Series 7: best syst 34 % · axial · 0.38mm/px · z∈[+1212,+1263]mm · 2 of 388 slices shown]
[im 130/388  vessel]
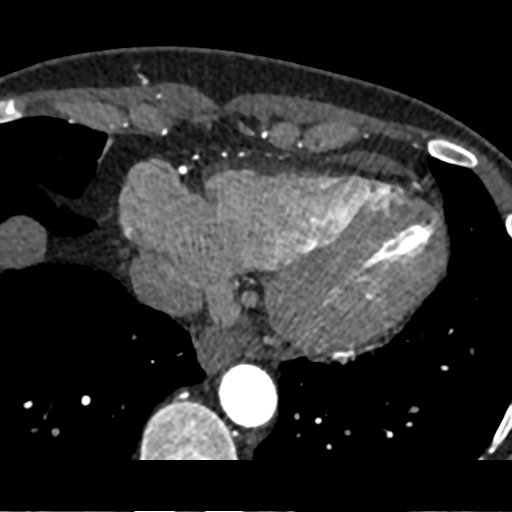
[im 259/388  vessel]
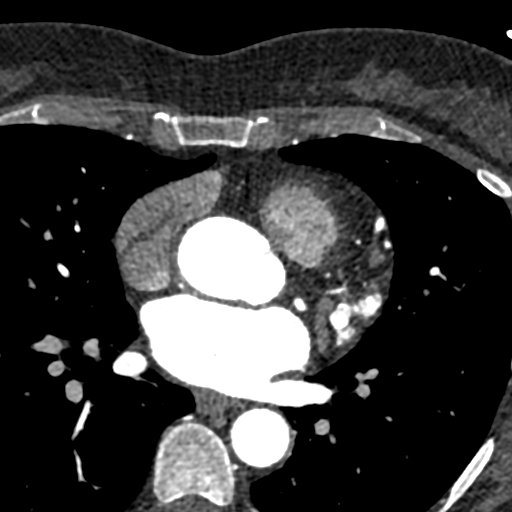

[Series 8: ts diast sharp 34 % · axial · 0.38mm/px · z∈[+1212,+1263]mm · 2 of 388 slices shown]
[im 130/388  lung]
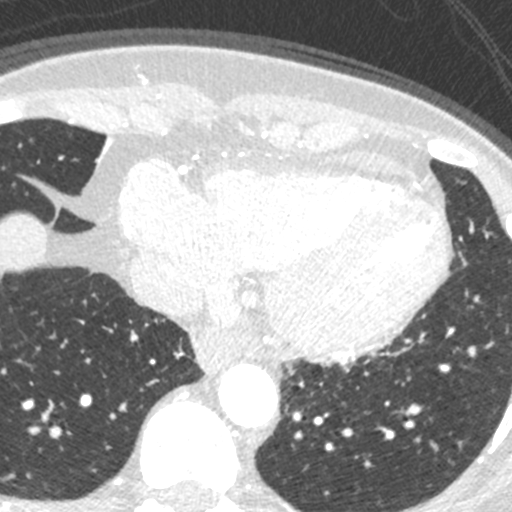
[im 259/388  lung]
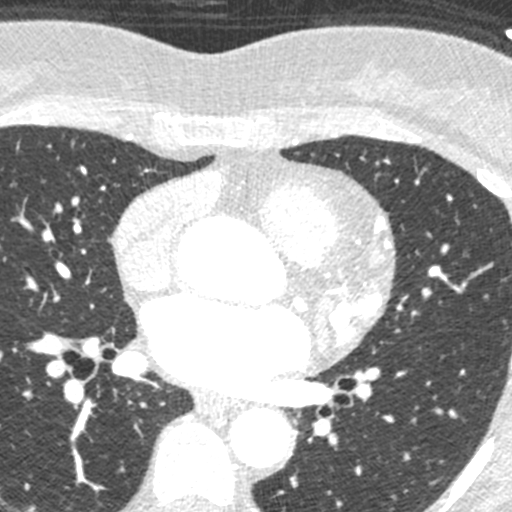

[Series 9: ts syst sharp 34 % · axial · 0.38mm/px · z∈[+1212,+1263]mm · 2 of 388 slices shown]
[im 130/388  lung]
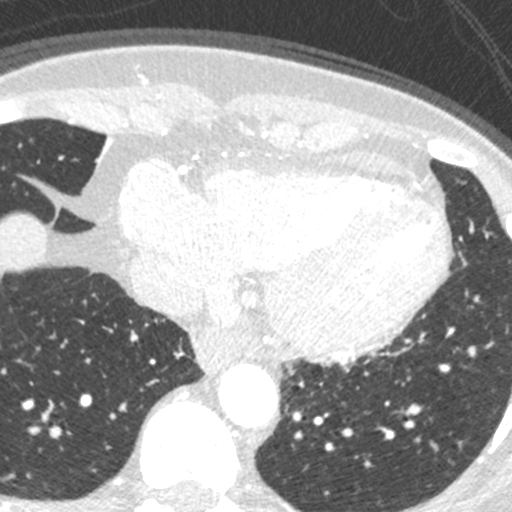
[im 259/388  lung]
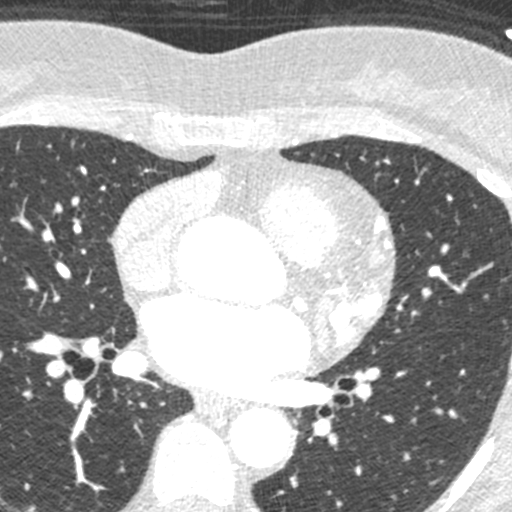

[8 of 20 positions shown; findings below may reference images not displayed]

FINDINGS: Within the visualized portions of the thorax there are no suspicious
appearing pulmonary nodules or masses, there is no acute
consolidative airspace disease, no pleural effusions, no
pneumothorax and no lymphadenopathy. Visualized portions of the
upper abdomen are unremarkable. There are no aggressive appearing
lytic or blastic lesions noted in the visualized portions of the
skeleton.
IMPRESSION: No significant incidental noncardiac findings are noted.
FINDINGS: A 100 kV prospective scan was triggered in the descending thoracic
aorta at 111 HU's. Axial non-contrast 3 mm slices were carried out
through the heart. The data set was analyzed on a dedicated work
station and scored using the Agatson method. Gantry rotation speed
was 250 msecs and collimation was .6 mm. 100 mg of metoprolol, beta
blockade and 0.8 mg of sl NTG was given. The 3D data set was
reconstructed in 5% intervals of the 67-82 % of the R-R cycle.
Diastolic phases were analyzed on a dedicated work station using
MPR, MIP and VRT modes. The patient received 80 cc of contrast.

Aorta:  Normal size.  No calcifications.  No dissection.

Aortic Valve:  Trileaflet.  No calcifications.

Coronary Arteries:  Normal coronary origin.  Right dominance.

RCA is a large dominant artery that gives rise to PDA and PLA. There
is no plaque.

Left main is a large artery that gives rise to LAD and LCX arteries.

LAD is a large vessel that has no plaque.

LCX is a non-dominant artery that gives rise to two OM branchs.
There is no plaque.

Other findings:

Normal pulmonary vein drainage into the left atrium.

Normal left atrial appendage without a thrombus.

Normal size of the pulmonary artery.
IMPRESSION: 1. Coronary calcium score of 0. This was 0 percentile for age and
sex matched control.

2. Normal coronary origin with right dominance.

3. No evidence of CAD (0%). CAD-RADS 0. Consider non-atherosclerotic
causes of chest pain.

*** End of Addendum ***
EXAM:
OVER-READ INTERPRETATION  CT CHEST

The following report is an over-read performed by radiologist Dr.
Blain Jumper [REDACTED] on 11/11/2019. This
over-read does not include interpretation of cardiac or coronary
anatomy or pathology. The coronary calcium score/coronary CTA
interpretation by the cardiologist is attached.
FINDINGS: Within the visualized portions of the thorax there are no suspicious
appearing pulmonary nodules or masses, there is no acute
consolidative airspace disease, no pleural effusions, no
pneumothorax and no lymphadenopathy. Visualized portions of the
upper abdomen are unremarkable. There are no aggressive appearing
lytic or blastic lesions noted in the visualized portions of the
skeleton.
IMPRESSION: No significant incidental noncardiac findings are noted.

## 2020-12-11 DIAGNOSIS — F4323 Adjustment disorder with mixed anxiety and depressed mood: Secondary | ICD-10-CM | POA: Diagnosis not present

## 2020-12-18 DIAGNOSIS — F4323 Adjustment disorder with mixed anxiety and depressed mood: Secondary | ICD-10-CM | POA: Diagnosis not present

## 2020-12-23 ENCOUNTER — Telehealth: Payer: Self-pay | Admitting: Neurology

## 2020-12-23 MED ORDER — AMPHETAMINE-DEXTROAMPHET ER 20 MG PO CP24
20.0000 mg | ORAL_CAPSULE | ORAL | 0 refills | Status: DC
Start: 2020-12-23 — End: 2021-01-26

## 2020-12-23 NOTE — Telephone Encounter (Signed)
Patient requested refill on Adderall XR 20 mg once daily

## 2020-12-25 DIAGNOSIS — F4323 Adjustment disorder with mixed anxiety and depressed mood: Secondary | ICD-10-CM | POA: Diagnosis not present

## 2020-12-29 DIAGNOSIS — F3342 Major depressive disorder, recurrent, in full remission: Secondary | ICD-10-CM | POA: Diagnosis not present

## 2021-01-01 DIAGNOSIS — F4323 Adjustment disorder with mixed anxiety and depressed mood: Secondary | ICD-10-CM | POA: Diagnosis not present

## 2021-01-08 DIAGNOSIS — F4323 Adjustment disorder with mixed anxiety and depressed mood: Secondary | ICD-10-CM | POA: Diagnosis not present

## 2021-01-15 DIAGNOSIS — F4323 Adjustment disorder with mixed anxiety and depressed mood: Secondary | ICD-10-CM | POA: Diagnosis not present

## 2021-01-22 DIAGNOSIS — F4323 Adjustment disorder with mixed anxiety and depressed mood: Secondary | ICD-10-CM | POA: Diagnosis not present

## 2021-01-26 ENCOUNTER — Other Ambulatory Visit: Payer: Self-pay | Admitting: Neurology

## 2021-01-26 MED ORDER — AMPHETAMINE-DEXTROAMPHET ER 20 MG PO CP24
20.0000 mg | ORAL_CAPSULE | ORAL | 0 refills | Status: DC
Start: 2021-01-26 — End: 2021-02-23

## 2021-01-26 NOTE — Addendum Note (Signed)
Addended by: Wyvonnia Lora on: 01/26/2021 08:56 AM   Modules accepted: Orders

## 2021-01-26 NOTE — Telephone Encounter (Signed)
Pt has called for a refill on her amphetamine-dextroamphetamine (ADDERALL XR) 20 MG 24 hr capsule to [facility] #[id number] 

## 2021-01-29 DIAGNOSIS — F4323 Adjustment disorder with mixed anxiety and depressed mood: Secondary | ICD-10-CM | POA: Diagnosis not present

## 2021-02-12 DIAGNOSIS — F4323 Adjustment disorder with mixed anxiety and depressed mood: Secondary | ICD-10-CM | POA: Diagnosis not present

## 2021-02-16 DIAGNOSIS — E890 Postprocedural hypothyroidism: Secondary | ICD-10-CM | POA: Diagnosis not present

## 2021-02-16 DIAGNOSIS — E0500 Thyrotoxicosis with diffuse goiter without thyrotoxic crisis or storm: Secondary | ICD-10-CM | POA: Diagnosis not present

## 2021-02-19 DIAGNOSIS — F4323 Adjustment disorder with mixed anxiety and depressed mood: Secondary | ICD-10-CM | POA: Diagnosis not present

## 2021-02-23 ENCOUNTER — Telehealth: Payer: Self-pay | Admitting: Neurology

## 2021-02-23 MED ORDER — AMPHETAMINE-DEXTROAMPHET ER 20 MG PO CP24
20.0000 mg | ORAL_CAPSULE | ORAL | 0 refills | Status: DC
Start: 2021-02-23 — End: 2021-03-26

## 2021-02-23 NOTE — Telephone Encounter (Signed)
Meds ordered this encounter  Medications  . amphetamine-dextroamphetamine (ADDERALL XR) 20 MG 24 hr capsule    Sig: Take 1 capsule (20 mg total) by mouth every morning.    Dispense:  30 capsule    Refill:  0    Penni Bombard, MD 6/94/8546, 27:03 PM Certified in Neurology, Neurophysiology and Neuroimaging  [facility] [address] [place], [place] [address] [phone]

## 2021-02-23 NOTE — Telephone Encounter (Signed)
Pt request refill amphetamine-dextroamphetamine (ADDERALL XR) 20 MG 24 hr capsule at [facility] #[id number]  Pt request send to the on call physician because will be out of medication on Saturday.

## 2021-02-26 DIAGNOSIS — F4323 Adjustment disorder with mixed anxiety and depressed mood: Secondary | ICD-10-CM | POA: Diagnosis not present

## 2021-02-27 DIAGNOSIS — G35 Multiple sclerosis: Secondary | ICD-10-CM | POA: Diagnosis not present

## 2021-03-05 DIAGNOSIS — F4323 Adjustment disorder with mixed anxiety and depressed mood: Secondary | ICD-10-CM | POA: Diagnosis not present

## 2021-03-14 DIAGNOSIS — G35 Multiple sclerosis: Secondary | ICD-10-CM | POA: Diagnosis not present

## 2021-03-14 DIAGNOSIS — M25539 Pain in unspecified wrist: Secondary | ICD-10-CM | POA: Diagnosis not present

## 2021-03-14 DIAGNOSIS — R351 Nocturia: Secondary | ICD-10-CM | POA: Diagnosis not present

## 2021-03-14 DIAGNOSIS — M25531 Pain in right wrist: Secondary | ICD-10-CM | POA: Diagnosis not present

## 2021-03-19 DIAGNOSIS — F4323 Adjustment disorder with mixed anxiety and depressed mood: Secondary | ICD-10-CM | POA: Diagnosis not present

## 2021-03-26 ENCOUNTER — Other Ambulatory Visit: Payer: Self-pay

## 2021-03-26 DIAGNOSIS — F4323 Adjustment disorder with mixed anxiety and depressed mood: Secondary | ICD-10-CM | POA: Diagnosis not present

## 2021-03-26 MED ORDER — AMPHETAMINE-DEXTROAMPHET ER 20 MG PO CP24
20.0000 mg | ORAL_CAPSULE | ORAL | 0 refills | Status: DC
Start: 2021-03-26 — End: 2021-03-27

## 2021-03-26 NOTE — Addendum Note (Signed)
Addended by: Wyvonnia Lora on: 03/26/2021 11:29 AM   Modules accepted: Orders

## 2021-03-26 NOTE — Telephone Encounter (Signed)
Pt called asking for refill for her amphetamine-dextroamphetamine (ADDERALL XR) 20 MG 24 hr capsule.  Pt would like refill to be sent to [facility] on [address].  Pt would like a call once refill is sent.

## 2021-03-26 NOTE — Telephone Encounter (Signed)
Called pt and informed her rx sent in by Dr. Leta Baptist since Dr. Felecia Shelling out. She verbalized understanding and appreciation. Confirmed rx went to [facility].

## 2021-03-27 MED ORDER — AMPHETAMINE-DEXTROAMPHET ER 20 MG PO CP24
20.0000 mg | ORAL_CAPSULE | ORAL | 0 refills | Status: DC
Start: 2021-03-27 — End: 2021-04-25

## 2021-03-27 NOTE — Telephone Encounter (Signed)
Ashley Key @ [facility] has called to inform they only filled pt's medication( adderall) for 10 days and not 30.  Ashley Key is asking to be called at [phone]

## 2021-03-27 NOTE — Telephone Encounter (Signed)
Called pharmacy back. They only had 10 tabs to provide pt, they are out of med otherwise. They should get more in tomorrow. Requesting another prescription for #20.

## 2021-03-27 NOTE — Addendum Note (Signed)
Addended by: Wyvonnia Lora on: 03/27/2021 03:57 PM   Modules accepted: Orders

## 2021-04-04 DIAGNOSIS — E890 Postprocedural hypothyroidism: Secondary | ICD-10-CM | POA: Diagnosis not present

## 2021-04-09 DIAGNOSIS — F4323 Adjustment disorder with mixed anxiety and depressed mood: Secondary | ICD-10-CM | POA: Diagnosis not present

## 2021-04-23 DIAGNOSIS — F4323 Adjustment disorder with mixed anxiety and depressed mood: Secondary | ICD-10-CM | POA: Diagnosis not present

## 2021-04-25 ENCOUNTER — Other Ambulatory Visit: Payer: Self-pay | Admitting: Neurology

## 2021-04-25 MED ORDER — AMPHETAMINE-DEXTROAMPHET ER 20 MG PO CP24
20.0000 mg | ORAL_CAPSULE | ORAL | 0 refills | Status: DC
Start: 2021-04-25 — End: 2021-05-21

## 2021-04-25 NOTE — Telephone Encounter (Signed)
Pt is needing a refill on her amphetamine-dextroamphetamine (ADDERALL XR) 20 MG 24 hr capsule sent in to the [facility] on [address].

## 2021-05-02 DIAGNOSIS — R059 Cough, unspecified: Secondary | ICD-10-CM | POA: Diagnosis not present

## 2021-05-02 DIAGNOSIS — R6883 Chills (without fever): Secondary | ICD-10-CM | POA: Diagnosis not present

## 2021-05-02 DIAGNOSIS — R0602 Shortness of breath: Secondary | ICD-10-CM | POA: Diagnosis not present

## 2021-05-02 DIAGNOSIS — R5383 Other fatigue: Secondary | ICD-10-CM | POA: Diagnosis not present

## 2021-05-02 DIAGNOSIS — Z03818 Encounter for observation for suspected exposure to other biological agents ruled out: Secondary | ICD-10-CM | POA: Diagnosis not present

## 2021-05-09 DIAGNOSIS — F4323 Adjustment disorder with mixed anxiety and depressed mood: Secondary | ICD-10-CM | POA: Diagnosis not present

## 2021-05-21 ENCOUNTER — Ambulatory Visit (INDEPENDENT_AMBULATORY_CARE_PROVIDER_SITE_OTHER): Payer: BC Managed Care – PPO | Admitting: Neurology

## 2021-05-21 ENCOUNTER — Encounter: Payer: Self-pay | Admitting: Neurology

## 2021-05-21 VITALS — BP 133/93 | HR 65 | Ht 66.0 in | Wt 157.5 lb

## 2021-05-21 DIAGNOSIS — F09 Unspecified mental disorder due to known physiological condition: Secondary | ICD-10-CM

## 2021-05-21 DIAGNOSIS — R41840 Attention and concentration deficit: Secondary | ICD-10-CM | POA: Diagnosis not present

## 2021-05-21 DIAGNOSIS — R200 Anesthesia of skin: Secondary | ICD-10-CM

## 2021-05-21 DIAGNOSIS — G35 Multiple sclerosis: Secondary | ICD-10-CM

## 2021-05-21 DIAGNOSIS — D693 Immune thrombocytopenic purpura: Secondary | ICD-10-CM

## 2021-05-21 DIAGNOSIS — R260 Ataxic gait: Secondary | ICD-10-CM | POA: Diagnosis not present

## 2021-05-21 MED ORDER — AMPHETAMINE-DEXTROAMPHET ER 20 MG PO CP24
20.0000 mg | ORAL_CAPSULE | ORAL | 0 refills | Status: DC
Start: 2021-05-21 — End: 2021-06-25

## 2021-05-21 NOTE — Progress Notes (Signed)
[facility]  PATIENT: Ashley Key DOB: February 03, 1966  REFERRING DOCTOR OR PCP:  London Pepper (PCP); Rachel Moulds (ADD) SOURCE: patient, notes from Dr. Orland Mustard, imaging/lab reports, MRI images on PACS  _________________________________   HISTORICAL  CHIEF COMPLAINT:  Chief Complaint  Patient presents with   Follow-up    RM 12, alone. Last seen 11/20/2020. On Ocrevus for MS. Last infusion: 02/27/21, next: 09/10/21. Receives at [facility] w/ intrafusion.    HISTORY OF PRESENT ILLNESS:  Ashley Key is a 55 y.o. woman with relapsing remitting MS diagnosed 10/2017.  Her main issues involve cognition with reduced ability to learn and focus  Update 05/21/2020: She feels her MS is mostly stable.   She is on Ocrevus.  Her last infusion is 02/27/2021.  Next one will be 09/10/2021.   Labs includng IgG and I were normal 11/2020  Gait and strength are ok.   She notes numbness in her right foot, but it is slightly better.  Gait mildly off balanced.  Bladder is doing better since stopping Buspar.     She has fatigue and notes reduced focus/attention.   She has neurocognitive deficits noted on neurocognitive testing.  She notes memory issues.  She did feel Adderall helped her attention and fatigue. She is back on (son was stealin so she went off).  Thyroid was a little off so her dose is being tweaked.    Her son is in juvenile detention so there is less stress in the house but she is still concerned about how he is going to do when he gets out.    She only gets to see him once a month and short phone calls a week.   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   She has anxiety/depression though she notes anxiety is better. .  Buspar worsened bladder issues and she stopped.  She is now off sertraline and on buproprion.    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   She has altered smell - often sensing a mold odor that can occur in different  places.    She had Covid-19 in April 2020 and it started afterwards (smell was reduced initially and then altered)  She had been vaccinated, in March and April 2020-- but the second shot was only 5 days before her Covid positive test.  She also did hte booster.    Since starting Ocrevus, the thrombocytopenia has resolved.    MS History: She noted  difficulties with her memory, focus and attention in 2016/2017.   She noted more difficulties with her work.   MRI showed changes c/w MS.  Physically she had mild gait disturbance    Sh started Ocrevus in 2018 and she has tolerated it well.   She had ITP 06/2014 with a platelet count of 12.  She was treated with steroids and improved (Dr. Benay Spice) [name] there was persistent thrombocytopenia (2018  plt count = 79) and it resolved with OCrevus.    IMAGING REVIEW: MRI of the brain 08/18/2019 and MRI 02/04/2019 showed no new lesions.   MRI of the cervical spine 10/21/2017 showed a T2 hyperintense focus at the cervicomedullary junction and mild degenerative changes at C3-C4 and C4-C5.  Normal enhancement pattern.  MRI of the brain 10/10/2017 showed multiple T2/FLAIR hyperintense foci in the juxtacortical, periventricular and deep white matter. Additionally there is a focus in the left thalamus, right midbrain, right pons and  a subtle focus in the right cerebellar hemisphere and possibly at the cervicomedullary junction.  One of the juxtacortical foci on the right is mildly hyperintense on diffusion-weighted images and may be more acute or be artifact.     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 Based on my clinical interview with the patient and her history, I concur with the diagnostic impressions described in her previous neuropsychological evaluation by Dr. Norton Pastel (dated 05/19/2018). Specifically, it appears that the patient is experiencing mild neurocognitive disorder due to  MS and preexisting ADHD.  Psychological testing did not reveal any indication of additional psychiatric component.  Ashley Key is a 55 year old, right-handed, Caucasian female, who reported experiencing cognitive deficits that began approximately 2 years ago. This is in the context of recently diagnosed relapsing-remitting multiple sclerosis.  Test results revealed intact functioning across most cognitive domains and thinking skills assessed during this evaluation with the exception of reduced aspects of verbal learning and memory as well as mildly slowed processing speed. From an emotional standpoint, there appears to be at least a moderate degree of recent depression and mild-to-moderate anxiety.  Mrs. Osinski's performance is consistent with a diagnosis of Mild Neurocognitive Disorder (i.e., mild cognitive impairment). With regard to etiology, these results are likely due to the cognitive consequences of multiple sclerosis and mood disruption.     REVIEW OF SYSTEMS: Constitutional: No fevers, chills, sweats, or change in appetite.   She has fatigue.   Mild insomnia.    Eyes: No visual changes, double vision, eye pain Ear, nose and throat: No hearing loss, ear pain, nasal congestion, sore throat Cardiovascular: No chest pain, palpitations Respiratory:  No shortness of breath at rest or with exertion.   No wheezes GastrointestinaI: No nausea, vomiting, diarrhea, abdominal pain, fecal incontinence Genitourinary:  Urinary urgency and nocturia. Musculoskeletal:  No neck pain, back pain Integumentary: No rash, pruritus, skin lesions Neurological: as above Psychiatric: mild depression Endocrine: No palpitations, diaphoresis, change in appetite, change in weigh or increased thirst Hematologic/Lymphatic:  No anemia, purpura, petechiae. Allergic/Immunologic: No itchy/runny eyes, nasal congestion, recent allergic reactions, rashes  ALLERGIES: No Known Allergies  HOME MEDICATIONS:  Current  Outpatient Medications:    amLODipine (NORVASC) 5 MG tablet, Take 5 mg by mouth daily. , Disp: , Rfl: 2   buPROPion (WELLBUTRIN XL) 150 MG 24 hr tablet, Take 150 mg by mouth every morning., Disp: , Rfl:    CALCIUM-VITAMIN D PO, Take 1 tablet by mouth daily., Disp: , Rfl:    Multiple Vitamins-Minerals (MULTIVITAMIN PO), Take 1 tablet by mouth daily., Disp: , Rfl:    ocrelizumab 600 mg in sodium chloride 0.9 % 500 mL, Inject 600 mg into the vein every 6 (six) months. , Disp: , Rfl:    rosuvastatin (CRESTOR) 5 MG tablet, TAKE 1 TABLET(5 MG) BY MOUTH DAILY, Disp: 30 tablet, Rfl: 0   SYNTHROID 112 MCG tablet, Take 112 mcg by mouth daily before breakfast. Alternates daily between 110 and 116mcg, Disp: , Rfl: 5   TURMERIC PO, Take 1 tablet by mouth daily., Disp: , Rfl:    amphetamine-dextroamphetamine (ADDERALL XR) 20 MG 24 hr capsule, Take 1 capsule (20 mg total) by mouth every morning., Disp: 30 capsule, Rfl: 0 No current facility-administered medications for this visit.  Facility-Administered Medications Ordered in Other Visits:    gadopentetate dimeglumine (MAGNEVIST) injection 15 mL, 15 mL, Intravenous, Once PRN, Shaylee Stanislawski, Nanine Means, MD  PAST MEDICAL HISTORY: Past  Medical History:  Diagnosis Date   ADHD (attention deficit hyperactivity disorder)    Arch pain    Arthritis    FEET   Ataxic gait 10/15/2017   Attention deficit 10/15/2017   Blood dyscrasia    NORMAL PLATLETS 50-70'S   Bunion    Chest pain, unspecified 01/18/2016   Cognitive deficit secondary to multiple sclerosis ([place]) 01/13/2018   Diplopia 08/10/2019   Essential hypertension 01/18/2016   Graves disease    Headache    HISTORY MIGRAINE   Hypertension    Hypothyroidism 01/18/2016   Idiopathic thrombocytopenic purpura ([place]) 06/27/2014   ITP (idiopathic thrombocytopenic purpura)    Memory loss 10/15/2017   Multiple sclerosis ([place]) 10/23/2017   Numbness 10/15/2017   Pelvic fracture (HCC)    Pneumonia    Screening, lipid 01/18/2016    Thrombocytopenia ([place]) 06/27/2014   White matter abnormality on MRI of brain 10/15/2017    PAST SURGICAL HISTORY: Past Surgical History:  Procedure Laterality Date   DILITATION & CURRETTAGE/HYSTROSCOPY WITH HYDROTHERMAL ABLATION N/A 09/27/2015   Procedure: DILATATION & CURETTAGE/HYSTEROSCOPY WITH HYDROTHERMAL ABLATION;  Surgeon: Thurnell Lose, MD;  Location: [facility];  Service: Gynecology;  Laterality: N/A;  ultrasound guidance    FOOT SURGERY     THYROID RADATION 2016     WISDOM TOOTH EXTRACTION      FAMILY HISTORY: Family History  Problem Relation Age of Onset   CAD Mother    Heart attack Mother    Stroke Father    Heart attack Father    CAD Brother    Heart attack Brother    Cancer Brother        prostate cancer   Heart attack Maternal Grandmother    Stroke Maternal Grandmother    Stroke Paternal Grandmother     SOCIAL HISTORY:  Social History   Socioeconomic History   Marital status: Married    Spouse name: Collier Salina   Number of children: 1   Years of education: Not on file   Highest education level: Not on file  Occupational History   Occupation: Clinical research associate: [employer]  Tobacco Use   Smoking status: Never   Smokeless tobacco: Never  Substance and Sexual Activity   Alcohol use: Yes    Comment: social   Drug use: No   Sexual activity: Yes  Other Topics Concern   Not on file  Social History Narrative   Married, husband Collier Salina   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   General: The patient is well-developed and well-nourished and in no acute distress   Neurologic Exam  Mental status: The patient is alert and oriented x 3 at the time of the examination. Short-term recall and focus attention are reduced (she writes things down).  Speech is normal.  Cranial nerves: Extraocular movements are full. Facial strength and sensation is normal.   No obvious hearing deficits are noted.  Motor:  Muscle bulk is normal.   Muscle tone is normal.. Strength is  5 / 5 in all 4 extremities.   Sensory:   She has symetric sensation today to touch/vibration  Coordination: There is good finger-nose-finger and mildly reduced heel-to-shin, right worse than left.  Gait and station: Station is normal.   The gait is mildly wide and the tandem gait is wide..  Romberg is negative.  Reflexes: Deep tendon reflexes are symmetric and 2 in arms and 3 in legs bilaterally with spread at the knees.  There is no ankle clonus.    DIAGNOSTIC DATA (LABS, IMAGING, TESTING) - I reviewed patient records, labs, notes, testing and imaging myself where available.  Lab Results  Component Value Date   WBC 3.7 11/20/2020   HGB 14.4 11/20/2020   HCT 42.9 11/20/2020   MCV 89 11/20/2020   PLT 265 11/20/2020      Component Value Date/Time   NA 137 04/14/2020 1827   K 4.2 04/14/2020 1827   CL 101 04/14/2020 1827   CO2 25 04/14/2020 1827   GLUCOSE 106 (H) 04/14/2020 1827   BUN 12 04/14/2020 1827   CREATININE 0.86 04/14/2020 1827   CALCIUM 8.7 (L) 04/14/2020 1827   PROT 6.8 04/14/2020 1827   PROT 6.7 08/10/2019 1409   ALBUMIN 3.8 04/14/2020 1827   ALBUMIN 4.7 08/10/2019 1409   AST 28 04/14/2020 1827   ALT 22 04/14/2020 1827   ALKPHOS 62 04/14/2020 1827   BILITOT 0.5 04/14/2020 1827   BILITOT <0.2 08/10/2019 1409   GFRNONAA >60 04/14/2020 1827   GFRAA >60 04/14/2020 1827        ASSESSMENT AND  PLAN  Multiple sclerosis (HCC)  Attention deficit  Cognitive deficit secondary to multiple sclerosis (HCC)  Ataxic gait  Idiopathic thrombocytopenic purpura (HCC)  Numbness   1.   Continue Ocrevus.  Labs were fine last visit.   At next visit, check IgG/IgM, CBC/Diff  2.   Continue  Adderall XR 20 mg for cognitive issues and fatigue.   3.   Stay active physically and mentally.  4.   Return to see me in 6 months or sooner with any new or worsening neurologic symptoms.   Ferrin Liebig A. Felecia Shelling, MD, PhD, FAAN Certified in Neurology, Clinical Neurophysiology, Sleep Medicine, Pain Medicine and Neuroimaging Director, [place] [facility] [address], [place], [place] [address] [phone]

## 2021-05-24 ENCOUNTER — Telehealth: Payer: Self-pay | Admitting: Neurology

## 2021-05-24 NOTE — Telephone Encounter (Signed)
MRI brain w/wo contrast anthem bcbs: [id number] (05/23/21-06/21/21)  Scheduled at [facility] 05/30/21 at 3:00 pm.

## 2021-05-28 DIAGNOSIS — E538 Deficiency of other specified B group vitamins: Secondary | ICD-10-CM | POA: Diagnosis not present

## 2021-05-28 DIAGNOSIS — I10 Essential (primary) hypertension: Secondary | ICD-10-CM | POA: Diagnosis not present

## 2021-05-28 DIAGNOSIS — E890 Postprocedural hypothyroidism: Secondary | ICD-10-CM | POA: Diagnosis not present

## 2021-05-28 DIAGNOSIS — G35 Multiple sclerosis: Secondary | ICD-10-CM | POA: Diagnosis not present

## 2021-05-28 DIAGNOSIS — E785 Hyperlipidemia, unspecified: Secondary | ICD-10-CM | POA: Diagnosis not present

## 2021-05-30 ENCOUNTER — Other Ambulatory Visit: Payer: Self-pay

## 2021-05-30 ENCOUNTER — Ambulatory Visit (INDEPENDENT_AMBULATORY_CARE_PROVIDER_SITE_OTHER): Payer: BC Managed Care – PPO

## 2021-05-30 DIAGNOSIS — G35 Multiple sclerosis: Secondary | ICD-10-CM | POA: Diagnosis not present

## 2021-05-30 MED ORDER — GADOBENATE DIMEGLUMINE 529 MG/ML IV SOLN
15.0000 mL | Freq: Once | INTRAVENOUS | Status: AC | PRN
  Administered 2021-05-30: 15 mL via INTRAVENOUS

## 2021-06-01 ENCOUNTER — Telehealth: Payer: Self-pay | Admitting: *Deleted

## 2021-06-01 NOTE — Telephone Encounter (Signed)
I spoke to the patient. She verbalized understanding of her results.

## 2021-06-01 NOTE — Telephone Encounter (Signed)
-----   Message from Britt Bottom, MD sent at 05/31/2021  5:35 PM EDT ----- Please let her know that the MRI of the brain did not show any new lesions.

## 2021-06-20 DIAGNOSIS — Z20822 Contact with and (suspected) exposure to covid-19: Secondary | ICD-10-CM | POA: Diagnosis not present

## 2021-06-20 DIAGNOSIS — U071 COVID-19: Secondary | ICD-10-CM | POA: Diagnosis not present

## 2021-06-20 DIAGNOSIS — R051 Acute cough: Secondary | ICD-10-CM | POA: Diagnosis not present

## 2021-06-25 ENCOUNTER — Telehealth: Payer: Self-pay | Admitting: Neurology

## 2021-06-25 ENCOUNTER — Other Ambulatory Visit: Payer: Self-pay | Admitting: Neurology

## 2021-06-25 MED ORDER — AMPHETAMINE-DEXTROAMPHET ER 20 MG PO CP24
20.0000 mg | ORAL_CAPSULE | ORAL | 0 refills | Status: DC
Start: 2021-06-25 — End: 2021-07-25

## 2021-06-25 NOTE — Telephone Encounter (Signed)
Pt requesting refill for amphetamine-dextroamphetamine (ADDERALL XR) 20 MG 24 hr capsule. [facility] [phone].

## 2021-06-25 NOTE — Telephone Encounter (Signed)
I have routed this request to Dr Ernst Bowler for review. The pt is due for the medication and [place] registry was verified.

## 2021-06-29 DIAGNOSIS — F4323 Adjustment disorder with mixed anxiety and depressed mood: Secondary | ICD-10-CM | POA: Diagnosis not present

## 2021-07-09 DIAGNOSIS — F4323 Adjustment disorder with mixed anxiety and depressed mood: Secondary | ICD-10-CM | POA: Diagnosis not present

## 2021-07-23 DIAGNOSIS — F4323 Adjustment disorder with mixed anxiety and depressed mood: Secondary | ICD-10-CM | POA: Diagnosis not present

## 2021-07-25 ENCOUNTER — Other Ambulatory Visit: Payer: Self-pay | Admitting: Neurology

## 2021-07-25 MED ORDER — AMPHETAMINE-DEXTROAMPHET ER 20 MG PO CP24
20.0000 mg | ORAL_CAPSULE | ORAL | 0 refills | Status: DC
Start: 2021-07-25 — End: 2021-12-11

## 2021-07-25 NOTE — Telephone Encounter (Signed)
Pt called requesting refill for amphetamine-dextroamphetamine (ADDERALL XR) 20 MG 24 hr capsule. Pharmacy [id number].  

## 2021-07-25 NOTE — Telephone Encounter (Signed)
I have routed this request to Dr [name] for review. The pt is due for the medication and [place] registry was verified.  

## 2021-07-25 NOTE — Addendum Note (Signed)
Addended by: Darleen Crocker on: 07/25/2021 01:35 PM   Modules accepted: Orders

## 2021-08-16 DIAGNOSIS — M7918 Myalgia, other site: Secondary | ICD-10-CM | POA: Diagnosis not present

## 2021-08-16 DIAGNOSIS — N9489 Other specified conditions associated with female genital organs and menstrual cycle: Secondary | ICD-10-CM | POA: Diagnosis not present

## 2021-08-16 DIAGNOSIS — N941 Unspecified dyspareunia: Secondary | ICD-10-CM | POA: Diagnosis not present

## 2021-08-16 DIAGNOSIS — N3281 Overactive bladder: Secondary | ICD-10-CM | POA: Diagnosis not present

## 2021-08-23 DIAGNOSIS — Z23 Encounter for immunization: Secondary | ICD-10-CM | POA: Diagnosis not present

## 2021-08-27 DIAGNOSIS — F4323 Adjustment disorder with mixed anxiety and depressed mood: Secondary | ICD-10-CM | POA: Diagnosis not present

## 2021-09-10 DIAGNOSIS — F4323 Adjustment disorder with mixed anxiety and depressed mood: Secondary | ICD-10-CM | POA: Diagnosis not present

## 2021-09-10 DIAGNOSIS — G35 Multiple sclerosis: Secondary | ICD-10-CM | POA: Diagnosis not present

## 2021-09-26 DIAGNOSIS — M7918 Myalgia, other site: Secondary | ICD-10-CM | POA: Diagnosis not present

## 2021-09-26 DIAGNOSIS — N9489 Other specified conditions associated with female genital organs and menstrual cycle: Secondary | ICD-10-CM | POA: Diagnosis not present

## 2021-09-26 DIAGNOSIS — N3281 Overactive bladder: Secondary | ICD-10-CM | POA: Diagnosis not present

## 2021-09-26 DIAGNOSIS — N941 Unspecified dyspareunia: Secondary | ICD-10-CM | POA: Diagnosis not present

## 2021-10-01 DIAGNOSIS — F4323 Adjustment disorder with mixed anxiety and depressed mood: Secondary | ICD-10-CM | POA: Diagnosis not present

## 2021-10-03 DIAGNOSIS — R059 Cough, unspecified: Secondary | ICD-10-CM | POA: Diagnosis not present

## 2021-10-03 DIAGNOSIS — Z03818 Encounter for observation for suspected exposure to other biological agents ruled out: Secondary | ICD-10-CM | POA: Diagnosis not present

## 2021-10-03 DIAGNOSIS — J029 Acute pharyngitis, unspecified: Secondary | ICD-10-CM | POA: Diagnosis not present

## 2021-10-03 DIAGNOSIS — B349 Viral infection, unspecified: Secondary | ICD-10-CM | POA: Diagnosis not present

## 2021-10-18 DIAGNOSIS — N952 Postmenopausal atrophic vaginitis: Secondary | ICD-10-CM | POA: Diagnosis not present

## 2021-10-18 DIAGNOSIS — N3281 Overactive bladder: Secondary | ICD-10-CM | POA: Diagnosis not present

## 2021-10-18 DIAGNOSIS — N9489 Other specified conditions associated with female genital organs and menstrual cycle: Secondary | ICD-10-CM | POA: Diagnosis not present

## 2021-10-29 DIAGNOSIS — R102 Pelvic and perineal pain: Secondary | ICD-10-CM | POA: Diagnosis not present

## 2021-10-29 DIAGNOSIS — Z23 Encounter for immunization: Secondary | ICD-10-CM | POA: Diagnosis not present

## 2021-10-29 DIAGNOSIS — M6281 Muscle weakness (generalized): Secondary | ICD-10-CM | POA: Diagnosis not present

## 2021-11-05 DIAGNOSIS — N941 Unspecified dyspareunia: Secondary | ICD-10-CM | POA: Diagnosis not present

## 2021-11-05 DIAGNOSIS — M6289 Other specified disorders of muscle: Secondary | ICD-10-CM | POA: Diagnosis not present

## 2021-11-05 DIAGNOSIS — N3281 Overactive bladder: Secondary | ICD-10-CM | POA: Diagnosis not present

## 2021-11-05 DIAGNOSIS — N9489 Other specified conditions associated with female genital organs and menstrual cycle: Secondary | ICD-10-CM | POA: Diagnosis not present

## 2021-11-19 ENCOUNTER — Ambulatory Visit: Payer: BC Managed Care – PPO | Admitting: Neurology

## 2021-11-19 ENCOUNTER — Other Ambulatory Visit: Payer: Self-pay | Admitting: Family Medicine

## 2021-11-19 DIAGNOSIS — Z1231 Encounter for screening mammogram for malignant neoplasm of breast: Secondary | ICD-10-CM

## 2021-11-19 DIAGNOSIS — Z01419 Encounter for gynecological examination (general) (routine) without abnormal findings: Secondary | ICD-10-CM | POA: Diagnosis not present

## 2021-11-19 DIAGNOSIS — F4323 Adjustment disorder with mixed anxiety and depressed mood: Secondary | ICD-10-CM | POA: Diagnosis not present

## 2021-11-26 ENCOUNTER — Ambulatory Visit: Payer: BC Managed Care – PPO | Admitting: Neurology

## 2021-12-11 ENCOUNTER — Encounter: Payer: Self-pay | Admitting: Neurology

## 2021-12-11 ENCOUNTER — Ambulatory Visit (INDEPENDENT_AMBULATORY_CARE_PROVIDER_SITE_OTHER): Payer: BC Managed Care – PPO | Admitting: Neurology

## 2021-12-11 VITALS — BP 115/82 | HR 73 | Ht 66.0 in | Wt 154.0 lb

## 2021-12-11 DIAGNOSIS — F09 Unspecified mental disorder due to known physiological condition: Secondary | ICD-10-CM

## 2021-12-11 DIAGNOSIS — D696 Thrombocytopenia, unspecified: Secondary | ICD-10-CM | POA: Diagnosis not present

## 2021-12-11 DIAGNOSIS — R260 Ataxic gait: Secondary | ICD-10-CM

## 2021-12-11 DIAGNOSIS — G35 Multiple sclerosis: Secondary | ICD-10-CM | POA: Diagnosis not present

## 2021-12-11 DIAGNOSIS — Z79899 Other long term (current) drug therapy: Secondary | ICD-10-CM

## 2021-12-11 DIAGNOSIS — F32A Depression, unspecified: Secondary | ICD-10-CM | POA: Insufficient documentation

## 2021-12-11 NOTE — Progress Notes (Signed)
[facility]  PATIENT: Ashley Key DOB: May 05, 1966  REFERRING DOCTOR OR PCP:  London Pepper (PCP); Rachel Moulds (ADD) SOURCE: patient, notes from Dr. Orland Mustard, imaging/lab reports, MRI images on PACS  _________________________________   HISTORICAL  CHIEF COMPLAINT:  Chief Complaint  Patient presents with   Follow-up    RM 2 ,alone. Last seen 0613/22. MS- On Ocrevus. Last infusion 09/10/21 and next one is 03/11/22.    HISTORY OF PRESENT ILLNESS:  Ashley Key is a 56 y.o. woman with relapsing remitting MS diagnosed 10/2017.  Her main issues involve cognition with reduced ability to learn and focus  Update 12/11/2021: She feels her MS is mostly stable.   She is on Ocrevus.    Labs includng IgG and IgM  were normal 11/2020  Gait and strength are ok.   She notes numbness in her right foot.  It is less than last year.   Gait is mildly off balanced at times an she uses the bannister going downstairs.  Bladder is doing ok.     She is having more hand pain bilaterally.  Pain is mostly at the base of the thumbs.  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      She has fatigue and reduced focus/attention.   She has neurocognitive deficits noted on neurocognitive testing.  She notes memory issues. She stopped the Adderall.   She has hypothyroid and is on Synthroid.    She tries to exercise some but is walking less since her walking partner needed surgery.   Weight is stable.    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  Her son is in juvenile detention so there is less stress in the house but she is still concerned about how he is going to do when he gets out next month.   She has anxiety/depression though she notes anxiety is better. .  Buspar worsened bladder issues and she stopped.  She is on buproprion.    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    Since starting Ocrevus, the thrombocytopenia has resolved.    MS History: She noted  difficulties with her memory, focus and attention in 2016/2017.   She noted more difficulties with her work.   MRI showed changes c/w MS.  Physically she had mild gait disturbance    Sh started Ocrevus in 2018 and she has tolerated it well.   She had ITP 06/2014 with a platelet count of 12.  She was treated with steroids and improved (Dr. Benay Spice) [name] there was persistent thrombocytopenia (2018  plt count = 79) and it resolved with OCrevus.    IMAGING REVIEW: MRI of the brain 08/18/2019 and MRI 02/04/2019 showed no new lesions.   MRI of the cervical spine 10/21/2017 showed a T2 hyperintense focus at the cervicomedullary junction and mild degenerative changes at C3-C4 and C4-C5.  Normal enhancement pattern.  MRI of the brain 10/10/2017 showed multiple T2/FLAIR hyperintense foci in the juxtacortical, periventricular and deep white matter. Additionally there is a focus in the left thalamus, right midbrain, right pons and a subtle focus in the right cerebellar hemisphere and possibly at the cervicomedullary junction.  One of the juxtacortical foci on the right is mildly hyperintense on diffusion-weighted images and may be more acute or be artifact.   MRI of the brain 05/30/2021  showed T2 hyperintense foci within the brainstem, thalamus and hemispheres in a pattern and configuration consistent with chronic demyelinating plaque associated with multiple sclerosis.  None of the foci appears to be acute.  They do not enhance.  Compared to the MRI dated 08/18/2019, there are no new lesions.    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 Based on my clinical interview with the patient and her history, I concur with the diagnostic impressions described in her previous neuropsychological evaluation by Dr. Norton Pastel (dated 05/19/2018). Specifically, it  appears that the patient is experiencing mild neurocognitive disorder due to MS and preexisting ADHD.  Psychological testing did not reveal any indication of additional psychiatric component.  Ashley Key is a 56 year old, right-handed, right-handed, Caucasian female, who reported experiencing cognitive deficits that began approximately 2 years ago. This is in the context of recently diagnosed relapsing-remitting multiple sclerosis.  Test results revealed intact functioning across most cognitive domains and thinking skills assessed during this evaluation with the exception of reduced aspects of verbal learning and memory as well as mildly slowed processing speed. From an emotional standpoint, there appears to be at least a moderate degree of recent depression and mild-to-moderate anxiety.  Ashley Key's performance is consistent with a diagnosis of Mild Neurocognitive Disorder (i.e., mild cognitive impairment). With regard to etiology, these results are likely due to the cognitive consequences of multiple sclerosis and mood disruption.     REVIEW OF SYSTEMS: Constitutional: No fevers, chills, sweats, or change in appetite.   She has fatigue.   Mild insomnia.    Eyes: No visual changes, double vision, eye pain Ear, nose and throat: No hearing loss, ear pain, nasal congestion, sore throat Cardiovascular: No chest pain, palpitations Respiratory:  No shortness of breath at rest or with exertion.   No wheezes GastrointestinaI: No nausea, vomiting, diarrhea, abdominal pain, fecal incontinence Genitourinary:  Urinary urgency and nocturia. Musculoskeletal:  No neck pain, back pain Integumentary: No rash, pruritus, skin lesions Neurological: as above Psychiatric: mild depression Endocrine: No palpitations, diaphoresis, change in appetite, change in weigh or increased thirst Hematologic/Lymphatic:  No anemia, purpura, petechiae. Allergic/Immunologic: No itchy/runny eyes, nasal congestion, recent allergic reactions,  rashes  ALLERGIES: No Known Allergies  HOME MEDICATIONS:  Current Outpatient Medications:    amLODipine (NORVASC) 5 MG tablet, Take 5 mg by mouth daily. , Disp: , Rfl: 2   buPROPion (WELLBUTRIN XL) 150 MG 24 hr tablet, Take 150 mg by mouth every morning., Disp: , Rfl:    CALCIUM-VITAMIN D PO, Take 1 tablet by mouth daily., Disp: , Rfl:    Multiple Vitamins-Minerals (MULTIVITAMIN PO), Take 1 tablet by mouth daily., Disp: , Rfl:    ocrelizumab 600 mg in sodium chloride 0.9 % 500 mL, Inject 600 mg into the vein every 6 (six) months. , Disp: , Rfl:    PREMARIN vaginal cream, Place 1 application vaginally 2 (two) times a week., Disp: , Rfl:    rosuvastatin (CRESTOR) 5 MG tablet, TAKE 1 TABLET(5 MG) BY MOUTH DAILY, Disp: 30 tablet, Rfl: 0   SYNTHROID 112 MCG tablet, Take 112 mcg by mouth. Alternates daily between 110 and 11mcg, Disp: , Rfl: 5   TURMERIC PO, Take 1 tablet by mouth daily., Disp: , Rfl:  No current facility-administered medications for this visit.  Facility-Administered Medications Ordered in Other Visits:    gadopentetate dimeglumine (MAGNEVIST) injection 15 mL, 15 mL, Intravenous, Once PRN, Annison Birchard, Nanine Means, MD  PAST MEDICAL  HISTORY: Past Medical History:  Diagnosis Date   ADHD (attention deficit hyperactivity disorder)    Arch pain    Arthritis    FEET   Ataxic gait 10/15/2017   Attention deficit 10/15/2017   Blood dyscrasia    NORMAL PLATLETS 50-70'S   Bunion    Chest pain, unspecified 01/18/2016   Cognitive deficit secondary to multiple sclerosis ([place]) 01/13/2018   Diplopia 08/10/2019   Essential hypertension 01/18/2016   Graves disease    Headache    HISTORY MIGRAINE   Hypertension    Hypothyroidism 01/18/2016   Idiopathic thrombocytopenic purpura ([place]) 06/27/2014   ITP (idiopathic thrombocytopenic purpura)    Memory loss 10/15/2017   Multiple sclerosis ([place]) 10/23/2017   Numbness 10/15/2017   Pelvic fracture ([facility])    Pneumonia    Screening, lipid 01/18/2016    Thrombocytopenia ([place]) 06/27/2014   White matter abnormality on MRI of brain 10/15/2017    PAST SURGICAL HISTORY: Past Surgical History:  Procedure Laterality Date   DILITATION & CURRETTAGE/HYSTROSCOPY WITH HYDROTHERMAL ABLATION N/A 09/27/2015   Procedure: DILATATION & CURETTAGE/HYSTEROSCOPY WITH HYDROTHERMAL ABLATION;  Surgeon: Thurnell Lose, MD;  Location: [facility];  Service: Gynecology;  Laterality: N/A;  ultrasound guidance    FOOT SURGERY     THYROID RADATION 2016     WISDOM TOOTH EXTRACTION      FAMILY HISTORY: Family History  Problem Relation Age of Onset   CAD Mother    Heart attack Mother    Stroke Father    Heart attack Father    CAD Brother    Heart attack Brother    Cancer Brother        prostate cancer   Heart attack Maternal Grandmother    Stroke Maternal Grandmother    Stroke Paternal Grandmother     SOCIAL HISTORY:  Social History   Socioeconomic History   Marital status: Married    Spouse name: Collier Salina   Number of children: 1   Years of education: Not on file   Highest education level: Not on file  Occupational History   Occupation: Clinical research associate: [employer]  Tobacco Use   Smoking status: Never   Smokeless tobacco: Never  Substance and Sexual Activity   Alcohol use: Yes    Comment: social   Drug use: No   Sexual activity: Yes  Other Topics Concern   Not on file  Social History Narrative   Married, husband Collier Salina   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   General: The patient is well-developed and well-nourished and in no acute distress.   No erythema in joints.   No heat.    Has mild tenderness to deep palpation over CMC joint at base of thumbs.     Neurologic Exam  Mental  status: The patient is alert and oriented x 3 at the time of the examination. Short-term recall and focus attention are reduced (she writes things down).  Speech is normal.  Cranial nerves: Extraocular movements are full. Facial strength and sensation is normal.   No obvious hearing deficits are noted.  Motor:  Muscle bulk is normal.   Muscle tone is normal.. Strength is  5 / 5 in all 4 extremities.   Sensory:   She has symetric sensation today to touch/vibration  Coordination: There is good finger-nose-finger and mildly reduced heel-to-shin, right worse than left.  Gait and station: Station is normal.   The gait is mildly wide and the tandem gait is wide..  Romberg is mildly positive.  Reflexes: Deep tendon reflexes are symmetric and 2 in arms and 3 in legs bilaterally with spread at the knees.  There is no ankle clonus.    DIAGNOSTIC DATA (LABS, IMAGING, TESTING) - I reviewed patient records, labs, notes, testing and imaging myself where available.  Lab Results  Component Value Date   WBC 3.7 11/20/2020   HGB 14.4 11/20/2020   HCT 42.9 11/20/2020   MCV 89 11/20/2020   PLT 265 11/20/2020      Component Value Date/Time   NA 137 04/14/2020 1827   K 4.2 04/14/2020 1827   CL 101 04/14/2020 1827   CO2 25 04/14/2020 1827   GLUCOSE 106 (H) 04/14/2020 1827   BUN 12 04/14/2020 1827   CREATININE 0.86 04/14/2020 1827   CALCIUM 8.7 (L) 04/14/2020 1827   PROT 6.8 04/14/2020 1827   PROT 6.7 08/10/2019 1409   ALBUMIN 3.8 04/14/2020 1827   ALBUMIN 4.7 08/10/2019 1409   AST 28 04/14/2020 1827   ALT 22 04/14/2020 1827   ALKPHOS 62 04/14/2020 1827   BILITOT 0.5 04/14/2020 1827   BILITOT  <0.2 08/10/2019 1409   GFRNONAA >60 04/14/2020 1827   GFRAA >60 04/14/2020 1827        ASSESSMENT AND PLAN  Multiple sclerosis ([place]) - Plan: CBC with Differential/Platelet, IgG, IgA, IgM  High risk medication use - Plan: CBC with Differential/Platelet, IgG, IgA, IgM  Thrombocytopenia ([facility]) - Plan: CBC with Differential/Platelet  Cognitive deficit secondary to multiple sclerosis ([facility])  Ataxic gait  Depression, unspecified depression type   1.   Continue Ocrevus.  Check IgG/IgM, CBC/Diff she will check with the infusion center to verify the next infusion date 2.   If ADD worsens can go back on Adderall.  3.   Stay active physically and mentally.  4.   She likely has mild osteoarthritis in the hands.  She did not have signs of rheumatoid arthritis on exam.  If symptoms worsen we will check an x-ray.  In the meantime she can take NSAIDs as needed.   5.   Return to see me in 6 months or sooner with any new or worsening neurologic symptoms.   Eryc Bodey A. Felecia Shelling, MD, PhD, FAAN Certified in Neurology, Clinical Neurophysiology, Sleep Medicine, Pain Medicine and Neuroimaging Director, [place] [facility] [address], [place],  [address] [phone]

## 2021-12-12 LAB — CBC WITH DIFFERENTIAL/PLATELET
Basophils Absolute: 0 10*3/uL (ref 0.0–0.2)
Basos: 1 %
EOS (ABSOLUTE): 0.1 10*3/uL (ref 0.0–0.4)
Eos: 4 %
Hematocrit: 41.7 % (ref 34.0–46.6)
Hemoglobin: 14.1 g/dL (ref 11.1–15.9)
Immature Grans (Abs): 0 10*3/uL (ref 0.0–0.1)
Immature Granulocytes: 0 %
Lymphocytes Absolute: 1 10*3/uL (ref 0.7–3.1)
Lymphs: 26 %
MCH: 29.6 pg (ref 26.6–33.0)
MCHC: 33.8 g/dL (ref 31.5–35.7)
MCV: 88 fL (ref 79–97)
Monocytes Absolute: 0.5 10*3/uL (ref 0.1–0.9)
Monocytes: 12 %
Neutrophils Absolute: 2.2 10*3/uL (ref 1.4–7.0)
Neutrophils: 57 %
Platelets: 237 10*3/uL (ref 150–450)
RBC: 4.76 x10E6/uL (ref 3.77–5.28)
RDW: 11.8 % (ref 11.7–15.4)
WBC: 3.8 10*3/uL (ref 3.4–10.8)

## 2021-12-12 LAB — IGG, IGA, IGM
IgA/Immunoglobulin A, Serum: 32 mg/dL — ABNORMAL LOW (ref 87–352)
IgG (Immunoglobin G), Serum: 849 mg/dL (ref 586–1602)
IgM (Immunoglobulin M), Srm: 52 mg/dL (ref 26–217)

## 2021-12-13 ENCOUNTER — Telehealth: Payer: Self-pay | Admitting: *Deleted

## 2021-12-13 NOTE — Telephone Encounter (Signed)
LVM about results (ok per DPR). Advised her to call back if she has any other questions.

## 2021-12-13 NOTE — Telephone Encounter (Signed)
-----   Message from Britt Bottom, MD sent at 12/12/2021  5:00 PM EST ----- Please let the patient know that the lab work is fine.

## 2021-12-14 DIAGNOSIS — Z03818 Encounter for observation for suspected exposure to other biological agents ruled out: Secondary | ICD-10-CM | POA: Diagnosis not present

## 2021-12-14 DIAGNOSIS — J069 Acute upper respiratory infection, unspecified: Secondary | ICD-10-CM | POA: Diagnosis not present

## 2021-12-17 DIAGNOSIS — F418 Other specified anxiety disorders: Secondary | ICD-10-CM | POA: Diagnosis not present

## 2021-12-17 DIAGNOSIS — G35 Multiple sclerosis: Secondary | ICD-10-CM | POA: Diagnosis not present

## 2021-12-17 DIAGNOSIS — I10 Essential (primary) hypertension: Secondary | ICD-10-CM | POA: Diagnosis not present

## 2021-12-17 DIAGNOSIS — E785 Hyperlipidemia, unspecified: Secondary | ICD-10-CM | POA: Diagnosis not present

## 2021-12-31 DIAGNOSIS — N3281 Overactive bladder: Secondary | ICD-10-CM | POA: Diagnosis not present

## 2021-12-31 DIAGNOSIS — M6289 Other specified disorders of muscle: Secondary | ICD-10-CM | POA: Diagnosis not present

## 2022-01-07 ENCOUNTER — Ambulatory Visit
Admission: RE | Admit: 2022-01-07 | Discharge: 2022-01-07 | Disposition: A | Discharge status: Home / Self Care | Payer: BC Managed Care – PPO | Source: Ambulatory Visit | Attending: Family Medicine | Admitting: Family Medicine

## 2022-01-07 DIAGNOSIS — Z1231 Encounter for screening mammogram for malignant neoplasm of breast: Secondary | ICD-10-CM

## 2022-01-07 DIAGNOSIS — F4323 Adjustment disorder with mixed anxiety and depressed mood: Secondary | ICD-10-CM | POA: Diagnosis not present

## 2022-02-18 DIAGNOSIS — E0500 Thyrotoxicosis with diffuse goiter without thyrotoxic crisis or storm: Secondary | ICD-10-CM | POA: Diagnosis not present

## 2022-02-18 DIAGNOSIS — E890 Postprocedural hypothyroidism: Secondary | ICD-10-CM | POA: Diagnosis not present

## 2022-02-18 DIAGNOSIS — R799 Abnormal finding of blood chemistry, unspecified: Secondary | ICD-10-CM | POA: Diagnosis not present

## 2022-02-20 ENCOUNTER — Encounter: Payer: Self-pay | Admitting: Neurology

## 2022-02-22 DIAGNOSIS — R413 Other amnesia: Secondary | ICD-10-CM | POA: Diagnosis not present

## 2022-02-25 ENCOUNTER — Telehealth: Payer: Self-pay | Admitting: Neurology

## 2022-02-25 DIAGNOSIS — F4323 Adjustment disorder with mixed anxiety and depressed mood: Secondary | ICD-10-CM | POA: Diagnosis not present

## 2022-02-25 NOTE — Telephone Encounter (Signed)
Called the nurse back. She didn't answer left a detailed message advising that a neuro cognitive testing referral would not be needed since she has couple of those completed already. Depending on their evaluation and whether they feel truly there is a decline we can place pt on wait list to have her worked in sooner and do a updated [place] on the pt. Advised that they can let us know. Informed she has an apt in July scheduled and we can address then unless they think needs to be assessed sooner they can let us know. ?

## 2022-02-25 NOTE — Telephone Encounter (Signed)
Ashley Derry, RN @ [name] (Dr Timberlake's office) has called to report pt came in about memory loss concerns.  They are questioning if pt needs repeated  Neuro Cognitive Testing, please call [phone] ?

## 2022-03-11 DIAGNOSIS — G35 Multiple sclerosis: Secondary | ICD-10-CM | POA: Diagnosis not present

## 2022-05-13 DIAGNOSIS — M533 Sacrococcygeal disorders, not elsewhere classified: Secondary | ICD-10-CM | POA: Diagnosis not present

## 2022-06-17 ENCOUNTER — Ambulatory Visit (INDEPENDENT_AMBULATORY_CARE_PROVIDER_SITE_OTHER): Payer: BC Managed Care – PPO | Admitting: Neurology

## 2022-06-17 ENCOUNTER — Encounter: Payer: Self-pay | Admitting: Neurology

## 2022-06-17 VITALS — BP 122/89 | HR 75 | Ht 66.0 in | Wt 166.0 lb

## 2022-06-17 DIAGNOSIS — S060X0S Concussion without loss of consciousness, sequela: Secondary | ICD-10-CM | POA: Diagnosis not present

## 2022-06-17 DIAGNOSIS — R260 Ataxic gait: Secondary | ICD-10-CM | POA: Diagnosis not present

## 2022-06-17 DIAGNOSIS — Z79899 Other long term (current) drug therapy: Secondary | ICD-10-CM

## 2022-06-17 DIAGNOSIS — G35 Multiple sclerosis: Secondary | ICD-10-CM | POA: Diagnosis not present

## 2022-06-17 DIAGNOSIS — R41840 Attention and concentration deficit: Secondary | ICD-10-CM

## 2022-06-17 DIAGNOSIS — F09 Unspecified mental disorder due to known physiological condition: Secondary | ICD-10-CM

## 2022-06-17 NOTE — Progress Notes (Signed)
[facility]  PATIENT: Ashley Key DOB: 1966/09/19  REFERRING DOCTOR OR PCP:  Ashley Key (PCP); Ashley Key (ADD) SOURCE: patient, notes from Ashley Key, imaging/lab reports, MRI images on PACS  _________________________________   HISTORICAL  CHIEF COMPLAINT:  Chief Complaint  Patient presents with   Follow-up    Rm 2, pt alone. Here for follow up visit. MS ocrevus. Last infusion- 03/11/2022. Next infusion 08/26/2022.     HISTORY OF PRESENT ILLNESS:  Ashley Key is a 56 y.o. woman with relapsing remitting MS diagnosed 10/2017.  Her main issues involve cognition with reduced ability to learn and focus  Update 06/17/2022: She fell off a chair 04/24/2022 while reaching for something and hit head on wall and tailbone on the floor.  She went to [facility].   She had x-rays showing no fractures.    She had visual changes afterwards but then was better a month later.     She feels her memory has been worse since then.   She has needed to change password multiple times as she could not remember.   She continues to have pain in her tailbone and is on Ibuprofen daily.      Today, she scored 24/30 on the MoCA losing 5 points for STM - 0/5 with no hints but 4/5 with multiple choice.   She scored 25/30 in 2018 losing all points for STM at that time  She feels her MS is mostly stable.   She is on Ocrevus.    Labs includng IgG and IgM  were normal 11/2020  Gait and strength are ok.   She notes numbness in her right foot.  It is less than last year.   Gait is mildly off balanced at times an she uses the bannister going downstairs.  Bladder is doing ok.     She is having more hand pain bilaterally.  Pain is mostly at the base of the thumbs.  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      She has fatigue and reduced focus/attention.   She has neurocognitive deficits noted on neurocognitive testing.  She  notes memory issues. She stopped the Adderall.   She has hypothyroid and is on Synthroid.    She tries to exercise some but is walking less since her walking partner needed surgery.   Weight is stable.    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  Her son is in juvenile detention so there is less stress in the house but she is still concerned about how he is going to do when he gets out next month.   She has anxiety/depression though she notes anxiety is better. .  Buspar worsened bladder issues and she stopped.  She is on buproprion.    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   Since starting Ocrevus, the thrombocytopenia has resolved.       06/17/2022   10:03 AM 10/15/2017    9:52 AM  Montreal Cognitive Assessment   Visuospatial/ Executive (0/5) 5 5  Naming (0/3) 3 3  Attention: Read list of digits (0/2) 2 2  Attention: Read list of letters (0/1) 1 1  Attention: Serial 7 subtraction starting at 100 (0/3) 3 3  Language: Repeat phrase (0/2) 1 2  Language : Fluency (0/1) 1 1  Abstraction (0/2)  2 2  Delayed Recall (0/5) 0 0  Orientation (0/6) 6 6  Total 24 25  Adjusted Score (based on education)  25     MS History: She noted  difficulties with her memory, focus and attention in 2016/2017.   She noted more difficulties with her work.   MRI showed changes c/w MS.  Physically she had mild gait disturbance    Sh started Ocrevus in 2018 and she has tolerated it well.   She had ITP 06/2014 with a platelet count of 12.  She was treated with steroids and improved (Ashley Key) Ashley Key there was persistent thrombocytopenia (2018  plt count = 79) and it resolved with OCrevus.    IMAGING REVIEW: MRI of the brain 08/18/2019 and MRI 02/04/2019 showed no new lesions.   MRI of the cervical spine 10/21/2017 showed a T2 hyperintense focus at the cervicomedullary junction and mild degenerative changes at C3-C4 and C4-C5.  Normal enhancement  pattern.  MRI of the brain 10/10/2017 showed multiple T2/FLAIR hyperintense foci in the juxtacortical, periventricular and deep white matter. Additionally there is a focus in the left thalamus, right midbrain, right pons and a subtle focus in the right cerebellar hemisphere and possibly at the cervicomedullary junction.  One of the juxtacortical foci on the right is mildly hyperintense on diffusion-weighted images and may be more acute or be artifact.   MRI of the brain 05/30/2021 showed T2 hyperintense foci within the brainstem, thalamus and hemispheres in a pattern and configuration consistent with chronic demyelinating plaque associated with multiple sclerosis.  None of the foci appears to be acute.  They do not enhance.  Compared to the MRI dated 08/18/2019, there are no new lesions.    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 Based on my clinical interview with the patient and her history, I concur with the diagnostic impressions described in her previous neuropsychological evaluation by Dr. Norton Key (dated 05/19/2018). Specifically, it appears that the patient is experiencing mild neurocognitive disorder due to MS and preexisting ADHD.  Psychological testing did not reveal any indication of additional psychiatric component.  Ashley Key is a 56 year old, right-handed, Caucasian female, who reported experiencing cognitive deficits that began approximately 2 years ago. This is in the context of recently diagnosed relapsing-remitting multiple sclerosis.  Test results revealed intact functioning across most cognitive domains and thinking skills assessed during this evaluation with the exception of reduced aspects of verbal learning and memory as well as mildly slowed processing speed. From an emotional standpoint, there appears to be at least a moderate degree of recent depression and mild-to-moderate anxiety.  Mrs.  Key's performance is consistent with a diagnosis of Mild Neurocognitive Disorder (i.e., mild cognitive impairment). With regard to etiology, these results are likely due to the cognitive consequences of multiple sclerosis and mood disruption.     REVIEW OF SYSTEMS: Constitutional: No fevers, chills, sweats, or change in appetite.   She has fatigue.   Mild insomnia.    Eyes: No visual changes, double vision, eye pain Ear, nose and throat: No hearing loss, ear pain, nasal congestion, sore throat Cardiovascular: No chest pain, palpitations Respiratory:  No shortness of breath at rest or with exertion.   No wheezes GastrointestinaI: No nausea, vomiting, diarrhea, abdominal pain, fecal incontinence Genitourinary:  Urinary urgency and nocturia. Musculoskeletal:  No neck pain, back pain Integumentary: No rash, pruritus, skin lesions Neurological: as above Psychiatric: mild depression Endocrine: No palpitations, diaphoresis, change  in appetite, change in weigh or increased thirst Hematologic/Lymphatic:  No anemia, purpura, petechiae. Allergic/Immunologic: No itchy/runny eyes, nasal congestion, recent allergic reactions, rashes  ALLERGIES: No Known Allergies  HOME MEDICATIONS:  Current Outpatient Medications:    amLODipine (NORVASC) 5 MG tablet, Take 5 mg by mouth daily. , Disp: , Rfl: 2   buPROPion (WELLBUTRIN XL) 150 MG 24 hr tablet, Take 150 mg by mouth every morning., Disp: , Rfl:    CALCIUM-VITAMIN D PO, Take 1 tablet by mouth daily., Disp: , Rfl:    cyclobenzaprine (FLEXERIL) 5 MG tablet, Take 5 mg by mouth at bedtime as needed., Disp: , Rfl:    ibuprofen (ADVIL) 800 MG tablet, Take 800 mg by mouth 3 (three) times daily as needed., Disp: , Rfl:    Multiple Vitamins-Minerals (MULTIVITAMIN PO), Take 1 tablet by mouth daily., Disp: , Rfl:    ocrelizumab 600 mg in sodium chloride 0.9 % 500 mL, Inject 600 mg into the vein every 6 (six) months. , Disp: , Rfl:    PREMARIN vaginal cream,  Place 1 application vaginally 2 (two) times a week., Disp: , Rfl:    rosuvastatin (CRESTOR) 5 MG tablet, TAKE 1 TABLET(5 MG) BY MOUTH DAILY, Disp: 30 tablet, Rfl: 0   SYNTHROID 112 MCG tablet, Take 112 mcg by mouth. Alternates daily between 110 and 148mg, Disp: , Rfl: 5   TURMERIC PO, Take 1 tablet by mouth daily., Disp: , Rfl:  No current facility-administered medications for this visit.  Facility-Administered Medications Ordered in Other Visits:    gadopentetate dimeglumine (MAGNEVIST) injection 15 mL, 15 mL, Intravenous, Once PRN, Laketra Bowdish, RNanine Means MD  PAST MEDICAL HISTORY: Past Medical History:  Diagnosis Date   ADHD (attention deficit hyperactivity disorder)    Arch pain    Arthritis    FEET   Ataxic gait 10/15/2017   Attention deficit 10/15/2017   Blood dyscrasia    NORMAL PLATLETS 50-70'S   Bunion    Chest pain, unspecified 01/18/2016   Cognitive deficit secondary to multiple sclerosis ([facility] 01/13/2018   Diplopia 08/10/2019   Essential hypertension 01/18/2016   Graves disease    Headache    HISTORY MIGRAINE   Hypertension    Hypothyroidism 01/18/2016   Idiopathic thrombocytopenic purpura ([facility] 06/27/2014   ITP (idiopathic thrombocytopenic purpura)    Memory loss 10/15/2017   Multiple sclerosis ([facility] 10/23/2017   Numbness 10/15/2017   Pelvic fracture ([facility])    Pneumonia    Screening, lipid 01/18/2016   Thrombocytopenia ([facility] 06/27/2014   White matter abnormality on MRI of brain 10/15/2017    PAST SURGICAL HISTORY: Past Surgical History:  Procedure Laterality Date   DILITATION & CURRETTAGE/HYSTROSCOPY WITH HYDROTHERMAL ABLATION N/A 09/27/2015   Procedure: DILATATION & CURETTAGE/HYSTEROSCOPY WITH HYDROTHERMAL ABLATION;  Surgeon: EThurnell Lose MD;  [facility];  Service: Gynecology;  Laterality: N/A;  ultrasound guidance    FOOT SURGERY     THYROID RADATION 2016     WISDOM TOOTH EXTRACTION      FAMILY HISTORY: Family History  Problem Relation Age of Onset   CAD Mother     Heart attack Mother    Stroke Father    Heart attack Father    CAD Brother    Heart attack Brother    Cancer Brother        prostate cancer   Heart attack Maternal Grandmother    Stroke Maternal Grandmother    Stroke Paternal Grandmother     SOCIAL HISTORY:  Social History  Socioeconomic History   Marital status: Married    Spouse name: Collier Salina   Number of children: 1   Years of education: Not on file   Highest education level: Not on file  Occupational History   Occupation: Clinical research associate: [employer]  Tobacco Use   Smoking status: Never   Smokeless tobacco: Never  Substance and Sexual Activity   Alcohol use: Yes    Comment: social   Drug use: No   Sexual activity: Yes  Other Topics Concern   Not on file  Social History Narrative   Married, husband Collier Salina   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   General: The patient is well-developed and well-nourished and in no acute distress.   No erythema in joints.   No heat.    Has mild tenderness to deep palpation over CMC joint at base of thumbs.  She has mild tenderness over the right piriformis muscle and over the sacrum   Neurologic Exam  Mental status: The patient is alert and oriented x 3 at the time of the examination. Short-term recall and focus attention are reduced (she writes things down).  Speech is normal.  Cranial nerves: Extraocular movements  are full. Facial strength and sensation is normal.   No obvious hearing deficits are noted.  Motor:  Muscle bulk is normal.   Muscle tone is normal.. Strength is  5 / 5 in all 4 extremities.   Sensory:   She has symetric sensation today to touch/vibration  Coordination: There is good finger-nose-finger and mildly reduced heel-to-shin, right worse than left.  Gait and station: Station is normal.   The gait is mildly wide and the tandem gait is wide..  Romberg is mildly positive.  Reflexes: Deep tendon reflexes are symmetric and 2 in arms and 3 in legs bilaterally with spread at the knees.  There is no ankle clonus.    DIAGNOSTIC DATA (LABS, IMAGING, TESTING) - I reviewed patient records, labs, notes, testing and imaging myself where available.  Lab Results  Component Value Date   WBC 3.8 12/11/2021   HGB 14.1 12/11/2021   HCT 41.7 12/11/2021   MCV 88 12/11/2021   PLT 237 12/11/2021      Component Value Date/Time   NA 137 04/14/2020 1827   K 4.2 04/14/2020 1827   CL 101 04/14/2020 1827   CO2 25 04/14/2020 1827   GLUCOSE 106 (H) 04/14/2020 1827   BUN 12 04/14/2020 1827   CREATININE 0.86 04/14/2020 1827   CALCIUM 8.7 (L) 04/14/2020 1827   PROT 6.8 04/14/2020 1827   PROT 6.7 08/10/2019 1409   ALBUMIN 3.8 04/14/2020 1827   ALBUMIN 4.7 08/10/2019 1409   AST 28 04/14/2020 1827   ALT 22 04/14/2020 1827   ALKPHOS 62 04/14/2020  1827   BILITOT 0.5 04/14/2020 1827   BILITOT <0.2 08/10/2019 1409   GFRNONAA >60 04/14/2020 1827   GFRAA >60 04/14/2020 1827        ASSESSMENT AND PLAN  Multiple sclerosis ([place]) - Plan: IgG, IgA, IgM, CBC with Differential/Platelet, MR BRAIN W WO CONTRAST  Concussion without loss of consciousness, sequela ([place]) - Plan: MR BRAIN W WO CONTRAST  High risk medication use - Plan: IgG, IgA, IgM, CBC with Differential/Platelet  Cognitive deficit secondary to multiple sclerosis ([place]) - Plan: MR BRAIN W WO CONTRAST  Ataxic gait  Attention  deficit   1.   Continue Ocrevus.  Check IgG/IgM, CBC/Diff     Check MRI to determine if subclinical progression and consider switch if occurring.   2.   Consider going back on the Adderall if she feels cognition does not get back to the baseline she was earlier this year.  3.   Stay active physically and mentally.  4.   For mild concussion, she is improving and should continue to do so.    Sacral pain could be periosteal bruise also mildly tender over piriformis muscle  5.   Return to see me in 6 months or sooner with any new or worsening neurologic symptoms.   Chantz Montefusco A. Felecia Shelling, MD, PhD, FAAN Certified in Neurology, Clinical Neurophysiology, Sleep Medicine, Pain Medicine and Neuroimaging Director, [place] at [facility] [address] [place], [place] [address] [phone]

## 2022-06-18 LAB — CBC WITH DIFFERENTIAL/PLATELET
Basophils Absolute: 0 10*3/uL (ref 0.0–0.2)
Basos: 1 %
EOS (ABSOLUTE): 0.2 10*3/uL (ref 0.0–0.4)
Eos: 5 %
Hematocrit: 42.3 % (ref 34.0–46.6)
Hemoglobin: 13.9 g/dL (ref 11.1–15.9)
Immature Grans (Abs): 0 10*3/uL (ref 0.0–0.1)
Immature Granulocytes: 0 %
Lymphocytes Absolute: 1.1 10*3/uL (ref 0.7–3.1)
Lymphs: 33 %
MCH: 29.3 pg (ref 26.6–33.0)
MCHC: 32.9 g/dL (ref 31.5–35.7)
MCV: 89 fL (ref 79–97)
Monocytes Absolute: 0.4 10*3/uL (ref 0.1–0.9)
Monocytes: 13 %
Neutrophils Absolute: 1.6 10*3/uL (ref 1.4–7.0)
Neutrophils: 48 %
Platelets: 225 10*3/uL (ref 150–450)
RBC: 4.74 x10E6/uL (ref 3.77–5.28)
RDW: 11.8 % (ref 11.7–15.4)
WBC: 3.3 10*3/uL — ABNORMAL LOW (ref 3.4–10.8)

## 2022-06-18 LAB — IGG, IGA, IGM
IgA/Immunoglobulin A, Serum: 31 mg/dL — ABNORMAL LOW (ref 87–352)
IgG (Immunoglobin G), Serum: 826 mg/dL (ref 586–1602)
IgM (Immunoglobulin M), Srm: 49 mg/dL (ref 26–217)

## 2022-06-20 ENCOUNTER — Telehealth: Payer: Self-pay | Admitting: Neurology

## 2022-06-20 NOTE — Telephone Encounter (Signed)
medicare/BCBS NPR sent to [facility]

## 2022-06-30 ENCOUNTER — Other Ambulatory Visit: Payer: BC Managed Care – PPO

## 2022-07-02 ENCOUNTER — Ambulatory Visit
Admission: RE | Admit: 2022-07-02 | Discharge: 2022-07-02 | Disposition: A | Discharge status: Home / Self Care | Payer: BC Managed Care – PPO | Source: Ambulatory Visit | Attending: Neurology | Admitting: Neurology

## 2022-07-02 DIAGNOSIS — F09 Unspecified mental disorder due to known physiological condition: Secondary | ICD-10-CM

## 2022-07-02 DIAGNOSIS — G35 Multiple sclerosis: Secondary | ICD-10-CM

## 2022-07-02 DIAGNOSIS — S060X0S Concussion without loss of consciousness, sequela: Secondary | ICD-10-CM

## 2022-07-02 MED ORDER — GADOBENATE DIMEGLUMINE 529 MG/ML IV SOLN
15.0000 mL | Freq: Once | INTRAVENOUS | Status: AC | PRN
  Administered 2022-07-02: 15 mL via INTRAVENOUS

## 2022-08-20 DIAGNOSIS — H6993 Unspecified Eustachian tube disorder, bilateral: Secondary | ICD-10-CM | POA: Diagnosis not present

## 2022-08-20 DIAGNOSIS — J069 Acute upper respiratory infection, unspecified: Secondary | ICD-10-CM | POA: Diagnosis not present

## 2022-09-05 DIAGNOSIS — G35 Multiple sclerosis: Secondary | ICD-10-CM | POA: Diagnosis not present

## 2022-09-05 DIAGNOSIS — U071 COVID-19: Secondary | ICD-10-CM | POA: Diagnosis not present

## 2022-09-11 DIAGNOSIS — G35 Multiple sclerosis: Secondary | ICD-10-CM | POA: Diagnosis not present

## 2022-09-17 DIAGNOSIS — R051 Acute cough: Secondary | ICD-10-CM | POA: Diagnosis not present

## 2022-09-17 DIAGNOSIS — U071 COVID-19: Secondary | ICD-10-CM | POA: Diagnosis not present

## 2022-09-17 DIAGNOSIS — J029 Acute pharyngitis, unspecified: Secondary | ICD-10-CM | POA: Diagnosis not present

## 2022-09-21 DIAGNOSIS — H65191 Other acute nonsuppurative otitis media, right ear: Secondary | ICD-10-CM | POA: Diagnosis not present

## 2022-09-21 DIAGNOSIS — U071 COVID-19: Secondary | ICD-10-CM | POA: Diagnosis not present

## 2022-09-25 ENCOUNTER — Other Ambulatory Visit: Payer: Self-pay

## 2022-09-25 ENCOUNTER — Emergency Department (HOSPITAL_COMMUNITY): Payer: BC Managed Care – PPO

## 2022-09-25 ENCOUNTER — Emergency Department (HOSPITAL_COMMUNITY)
Admission: EM | Admit: 2022-09-25 | Discharge: 2022-09-25 | Disposition: A | Discharge status: Home / Self Care | Payer: BC Managed Care – PPO | Attending: Emergency Medicine | Admitting: Emergency Medicine

## 2022-09-25 ENCOUNTER — Encounter (HOSPITAL_COMMUNITY): Payer: Self-pay

## 2022-09-25 DIAGNOSIS — I3139 Other pericardial effusion (noninflammatory): Secondary | ICD-10-CM | POA: Diagnosis not present

## 2022-09-25 DIAGNOSIS — R0602 Shortness of breath: Secondary | ICD-10-CM | POA: Diagnosis not present

## 2022-09-25 DIAGNOSIS — U071 COVID-19: Secondary | ICD-10-CM | POA: Diagnosis not present

## 2022-09-25 DIAGNOSIS — R079 Chest pain, unspecified: Secondary | ICD-10-CM | POA: Diagnosis not present

## 2022-09-25 DIAGNOSIS — I10 Essential (primary) hypertension: Secondary | ICD-10-CM | POA: Diagnosis not present

## 2022-09-25 LAB — I-STAT CHEM 8, ED
BUN: 7 mg/dL (ref 6–20)
Calcium, Ion: 1.15 mmol/L (ref 1.15–1.40)
Chloride: 101 mmol/L (ref 98–111)
Creatinine, Ser: 0.8 mg/dL (ref 0.44–1.00)
Glucose, Bld: 100 mg/dL — ABNORMAL HIGH (ref 70–99)
HCT: 40 % (ref 36.0–46.0)
Hemoglobin: 13.6 g/dL (ref 12.0–15.0)
Potassium: 3.8 mmol/L (ref 3.5–5.1)
Sodium: 139 mmol/L (ref 135–145)
TCO2: 31 mmol/L (ref 22–32)

## 2022-09-25 MED ORDER — SODIUM CHLORIDE 0.9 % IV BOLUS
250.0000 mL | Freq: Once | INTRAVENOUS | Status: AC
  Administered 2022-09-25: 250 mL via INTRAVENOUS

## 2022-09-25 MED ORDER — IOHEXOL 350 MG/ML SOLN
100.0000 mL | Freq: Once | INTRAVENOUS | Status: AC | PRN
  Administered 2022-09-25: 100 mL via INTRAVENOUS

## 2022-09-25 MED ORDER — SODIUM CHLORIDE (PF) 0.9 % IJ SOLN
INTRAMUSCULAR | Status: AC
  Filled 2022-09-25: qty 50

## 2022-09-25 NOTE — ED Triage Notes (Signed)
Pt BIB EMS with reports of cough and SHOB x 3 weeks after testing positive for covid.

## 2022-09-25 NOTE — ED Provider Notes (Signed)
[facility] DEPT Provider Note   CSN: [id number] Arrival date & time: 09/25/22  0405     History  Chief Complaint  Patient presents with   Cough   Shortness of Breath    Ashley Key is a 56 y.o. female.  The history is provided by the patient.  Cough Severity:  Moderate Onset quality:  Gradual Duration:  3 weeks Timing:  Constant Progression:  Unchanged Context comment:  Currently has covid Relieved by:  Nothing Worsened by:  Nothing Ineffective treatments:  None tried Associated symptoms: no chest pain, no fever and no wheezing   Risk factors: no chemical exposure   Patient currently has covid for the last 3 weeks and has not felt well most of the time.  No chest pain.  Also fatigue and ear pain that she is on an antibiotic for.       Home Medications Prior to Admission medications   Medication Sig Start Date End Date Taking? Authorizing Provider  amLODipine (NORVASC) 5 MG tablet Take 5 mg by mouth daily.  12/01/15   [provider]  buPROPion (WELLBUTRIN XL) 150 MG 24 hr tablet Take 150 mg by mouth every morning. 11/08/20   [provider]  CALCIUM-VITAMIN D PO Take 1 tablet by mouth daily.    [provider]  cyclobenzaprine (FLEXERIL) 5 MG tablet Take 5 mg by mouth at bedtime as needed. 06/14/22   [provider]  ibuprofen (ADVIL) 800 MG tablet Take 800 mg by mouth 3 (three) times daily as needed. 04/22/22   [provider]  Multiple Vitamins-Minerals (MULTIVITAMIN PO) Take 1 tablet by mouth daily.    [provider]  ocrelizumab 600 mg in sodium chloride 0.9 % 500 mL Inject 600 mg into the vein every 6 (six) months.     [provider]  PREMARIN vaginal cream Place 1 application vaginally 2 (two) times a week. 11/12/21   [provider]  rosuvastatin (CRESTOR) 5 MG tablet TAKE 1 TABLET(5 MG) BY MOUTH DAILY 06/12/17   Hilty, Nadean Corwin, MD  SYNTHROID 112 MCG tablet Take  112 mcg by mouth. Alternates daily between 110 and 143mg 04/02/16   [provider]  TURMERIC PO Take 1 tablet by mouth daily.    [provider]      Allergies    Patient has no known allergies.    Review of Systems   Review of Systems  Constitutional:  Positive for fatigue. Negative for fever.  HENT:  Negative for facial swelling.   Eyes:  Negative for redness.  Respiratory:  Positive for cough. Negative for wheezing and stridor.   Cardiovascular:  Negative for chest pain.  All other systems reviewed and are negative.   Physical Exam Updated Vital Signs BP (!) 138/96   Pulse 71   Temp 99.1 F (37.3 C) (Oral)   Resp 17   Ht '5\' 6"'$  (1.676 m)   Wt 74.8 kg   SpO2 97%   BMI 26.63 kg/m  Physical Exam Vitals and nursing note reviewed.  Constitutional:      General: She is not in acute distress.    Appearance: Normal appearance. She is well-developed.  HENT:     Head: Normocephalic and atraumatic.     Right Ear: Tympanic membrane and ear canal normal.     Left Ear: Tympanic membrane and ear canal normal.     Nose: Nose normal.  Eyes:     Pupils: Pupils are equal, round,  and reactive to light.  Cardiovascular:     Rate and Rhythm: Normal rate and regular rhythm.     Pulses: Normal pulses.     Heart sounds: Normal heart sounds.  Pulmonary:     Effort: Pulmonary effort is normal. No respiratory distress.     Breath sounds: Normal breath sounds.  Abdominal:     General: Bowel sounds are normal. There is no distension.     Palpations: Abdomen is soft.     Tenderness: There is no abdominal tenderness. There is no guarding or rebound.  Genitourinary:    Vagina: No vaginal discharge.  Musculoskeletal:        General: Normal range of motion.     Cervical back: Neck supple.  Skin:    General: Skin is warm and dry.     Capillary Refill: Capillary refill takes less than 2 seconds.     Findings: No erythema or rash.  Neurological:     General: No focal  deficit present.     Mental Status: She is alert and oriented to person, place, and time.     Deep Tendon Reflexes: Reflexes normal.  Psychiatric:        Mood and Affect: Mood normal.        Behavior: Behavior normal.     ED Results / Procedures / Treatments   Labs (all labs ordered are listed, but only abnormal results are displayed) Results for orders placed or performed during the hospital encounter of 09/25/22  I-stat chem 8, ED (not at [facility] or [facility])  Result Value Ref Range   Sodium 139 135 - 145 mmol/L   Potassium 3.8 3.5 - 5.1 mmol/L   Chloride 101 98 - 111 mmol/L   BUN 7 6 - 20 mg/dL   Creatinine, Ser 0.80 0.44 - 1.00 mg/dL   Glucose, Bld 100 (H) 70 - 99 mg/dL   Calcium, Ion 1.15 1.15 - 1.40 mmol/L   TCO2 31 22 - 32 mmol/L   Hemoglobin 13.6 12.0 - 15.0 g/dL   HCT 40.0 36.0 - 46.0 %   No results found.  EKG EKG Interpretation  Date/Time:  Wednesday September 25 2022 04:16:06 EDT Ventricular Rate:  75 PR Interval:  190 QRS Duration: 86 QT Interval:  414 QTC Calculation: 463 R Axis:   63 Text Interpretation: Sinus rhythm Low voltage, precordial leads Confirmed by Randal Buba, Adelai Achey ([id number]) on 09/25/2022 5:19:07 AM  Radiology No results found.  Procedures Procedures    Medications Ordered in ED Medications  sodium chloride 0.9 % bolus 250 mL (has no administration in time range)  sodium chloride (PF) 0.9 % injection (has no administration in time range)  iohexol (OMNIPAQUE) 350 MG/ML injection 100 mL (has no administration in time range)    ED Course/ Medical Decision Making/ A&P                           Medical Decision Making Patient with covid for 3 weeks with cough and fatigue   Amount and/or Complexity of Data Reviewed Labs: ordered.    Details: All labs reviewed: normal sodium 139, normal sodium 3.8, normal creatinine 1.15, elevated glucose  Radiology: ordered.  Risk Prescription drug management. Risk Details: I doubt findings on CT are bacterial  as patient has active covid but is also currently on an antibiotic at this time. Even if this is pneumonitis it is likely viral from covid.  Patient has a RR of 14 in the  room and is saturating 99% on room air and is very comfortable.  Patient is well appearing with normal vital signs and is stable for discharge.  Strict return   Final Clinical Impression(s) / ED Diagnoses Final diagnoses:  None   Return for intractable cough, coughing up blood, fevers > 100.4 unrelieved by medication, shortness of breath, intractable vomiting, chest pain, shortness of breath, weakness, numbness, changes in speech, facial asymmetry, abdominal pain, passing out, Inability to tolerate liquids or food, cough, altered mental status or any concerns. No signs of systemic illness or infection. The patient is nontoxic-appearing on exam and vital signs are within normal limits.  I have reviewed the triage vital signs and the nursing notes. Pertinent labs & imaging results that were available during my care of the patient were reviewed by me and considered in my medical decision making (see chart for details). After history, exam, and medical workup I feel the patient has been appropriately medically screened and is safe for discharge home. Pertinent diagnoses were discussed with the patient. Patient was given return precautions.  Rx / DC Orders ED Discharge Orders     None         Dave Mannes, MD 09/25/22 0710

## 2022-10-03 DIAGNOSIS — J988 Other specified respiratory disorders: Secondary | ICD-10-CM | POA: Diagnosis not present

## 2022-10-03 DIAGNOSIS — J019 Acute sinusitis, unspecified: Secondary | ICD-10-CM | POA: Diagnosis not present

## 2022-10-03 DIAGNOSIS — H9209 Otalgia, unspecified ear: Secondary | ICD-10-CM | POA: Diagnosis not present

## 2022-10-18 DIAGNOSIS — D693 Immune thrombocytopenic purpura: Secondary | ICD-10-CM | POA: Diagnosis not present

## 2022-10-18 DIAGNOSIS — E890 Postprocedural hypothyroidism: Secondary | ICD-10-CM | POA: Diagnosis not present

## 2022-10-18 DIAGNOSIS — Z0000 Encounter for general adult medical examination without abnormal findings: Secondary | ICD-10-CM | POA: Diagnosis not present

## 2022-10-18 DIAGNOSIS — E538 Deficiency of other specified B group vitamins: Secondary | ICD-10-CM | POA: Diagnosis not present

## 2022-10-18 DIAGNOSIS — I10 Essential (primary) hypertension: Secondary | ICD-10-CM | POA: Diagnosis not present

## 2022-10-18 DIAGNOSIS — Z23 Encounter for immunization: Secondary | ICD-10-CM | POA: Diagnosis not present

## 2022-10-18 DIAGNOSIS — Z13220 Encounter for screening for lipoid disorders: Secondary | ICD-10-CM | POA: Diagnosis not present

## 2022-11-24 DIAGNOSIS — H1089 Other conjunctivitis: Secondary | ICD-10-CM | POA: Diagnosis not present

## 2022-12-08 DIAGNOSIS — Z20822 Contact with and (suspected) exposure to covid-19: Secondary | ICD-10-CM | POA: Diagnosis not present

## 2022-12-08 DIAGNOSIS — J019 Acute sinusitis, unspecified: Secondary | ICD-10-CM | POA: Diagnosis not present

## 2022-12-08 DIAGNOSIS — R059 Cough, unspecified: Secondary | ICD-10-CM | POA: Diagnosis not present

## 2022-12-16 DIAGNOSIS — R051 Acute cough: Secondary | ICD-10-CM | POA: Diagnosis not present

## 2022-12-16 DIAGNOSIS — U071 COVID-19: Secondary | ICD-10-CM | POA: Diagnosis not present

## 2022-12-16 DIAGNOSIS — R059 Cough, unspecified: Secondary | ICD-10-CM | POA: Diagnosis not present

## 2022-12-25 ENCOUNTER — Other Ambulatory Visit: Payer: Self-pay | Admitting: Family Medicine

## 2022-12-25 ENCOUNTER — Ambulatory Visit
Admission: RE | Admit: 2022-12-25 | Discharge: 2022-12-25 | Disposition: A | Discharge status: Home / Self Care | Payer: Medicare Other | Source: Ambulatory Visit | Attending: Family Medicine | Admitting: Family Medicine

## 2022-12-25 DIAGNOSIS — U071 COVID-19: Secondary | ICD-10-CM

## 2022-12-25 DIAGNOSIS — R0602 Shortness of breath: Secondary | ICD-10-CM | POA: Diagnosis not present

## 2022-12-25 DIAGNOSIS — R053 Chronic cough: Secondary | ICD-10-CM | POA: Diagnosis not present

## 2022-12-31 DIAGNOSIS — J988 Other specified respiratory disorders: Secondary | ICD-10-CM | POA: Diagnosis not present

## 2022-12-31 DIAGNOSIS — R0981 Nasal congestion: Secondary | ICD-10-CM | POA: Diagnosis not present

## 2022-12-31 DIAGNOSIS — U071 COVID-19: Secondary | ICD-10-CM | POA: Diagnosis not present

## 2023-01-16 DIAGNOSIS — R350 Frequency of micturition: Secondary | ICD-10-CM | POA: Diagnosis not present

## 2023-01-16 DIAGNOSIS — R059 Cough, unspecified: Secondary | ICD-10-CM | POA: Diagnosis not present

## 2023-01-16 DIAGNOSIS — N390 Urinary tract infection, site not specified: Secondary | ICD-10-CM | POA: Diagnosis not present

## 2023-01-17 DIAGNOSIS — R350 Frequency of micturition: Secondary | ICD-10-CM | POA: Diagnosis not present

## 2023-01-23 ENCOUNTER — Telehealth: Payer: Self-pay | Admitting: Neurology

## 2023-01-23 DIAGNOSIS — R058 Other specified cough: Secondary | ICD-10-CM | POA: Diagnosis not present

## 2023-01-23 DIAGNOSIS — R140 Abdominal distension (gaseous): Secondary | ICD-10-CM | POA: Diagnosis not present

## 2023-01-23 NOTE — Telephone Encounter (Signed)
Pt is calling. Stated she would like an appointment with Dr. Felecia Shelling to take a look at her MS medication. Said she went to her PCP and was told the medication my be effecting her lungs. Pt is requesting a call back from nurse.

## 2023-01-23 NOTE — Telephone Encounter (Signed)
Called pt and told her that Dr. 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 Pt said that she had been sick lately and that her PCP tested her for Flu and everything else and her results showed she has long term Covid. Pt was offered an appointment for 02/12/2023 @9am$  and she accepted it. Pt verbalized understanding.

## 2023-01-24 NOTE — Telephone Encounter (Signed)
I reviewed the office note from Dr. Lindell Noe 01/23/2023.  Ashley Key has a chronic cough.  Etiology is uncertain.  Of note, she had COVID around Christmas time.  At that time the chest x-ray was normal.    Differential diagnosis included pneumonitis or sequela of COVID she was going to be referred to pulmonology.  I do not believe that the Ocrevus would play a role in this.  It has been associated with a higher risk of pneumonia and upper respiratory infections.

## 2023-02-11 DIAGNOSIS — U071 COVID-19: Secondary | ICD-10-CM | POA: Diagnosis not present

## 2023-02-11 DIAGNOSIS — R059 Cough, unspecified: Secondary | ICD-10-CM | POA: Diagnosis not present

## 2023-02-12 ENCOUNTER — Ambulatory Visit: Payer: Medicare Other | Admitting: Neurology

## 2023-02-12 ENCOUNTER — Encounter: Payer: Self-pay | Admitting: Neurology

## 2023-02-17 DIAGNOSIS — J988 Other specified respiratory disorders: Secondary | ICD-10-CM | POA: Diagnosis not present

## 2023-02-17 DIAGNOSIS — R059 Cough, unspecified: Secondary | ICD-10-CM | POA: Diagnosis not present

## 2023-02-18 ENCOUNTER — Telehealth: Payer: Self-pay | Admitting: Neurology

## 2023-02-18 NOTE — Telephone Encounter (Signed)
VM left asking pt to call and confirm if she can accept the 04-02- appointment '@930'$ 

## 2023-02-18 NOTE — Telephone Encounter (Signed)
Please call pt back and offer sooner appt on 03/11/23 at 930am wit Dr. Felecia Shelling

## 2023-02-18 NOTE — Telephone Encounter (Signed)
Pt said, let Dr. Felecia Shelling know have had Covid 3 times, in January, February, and March. Would like a call from the nurse to discuss concerns about my immune system and a sooner appt.

## 2023-02-20 DIAGNOSIS — E890 Postprocedural hypothyroidism: Secondary | ICD-10-CM | POA: Diagnosis not present

## 2023-02-20 DIAGNOSIS — E0500 Thyrotoxicosis with diffuse goiter without thyrotoxic crisis or storm: Secondary | ICD-10-CM | POA: Diagnosis not present

## 2023-02-20 NOTE — Telephone Encounter (Signed)
Confirmed that pt scheduled for 930a, not 9am.

## 2023-02-20 NOTE — Telephone Encounter (Signed)
Called pt and scheduled her appointment with Dr. Felecia Shelling on 4/2 @ 9am.

## 2023-03-11 ENCOUNTER — Encounter: Payer: Self-pay | Admitting: Neurology

## 2023-03-11 ENCOUNTER — Ambulatory Visit (INDEPENDENT_AMBULATORY_CARE_PROVIDER_SITE_OTHER): Payer: BC Managed Care – PPO | Admitting: Neurology

## 2023-03-11 VITALS — BP 120/81 | HR 80 | Ht 66.0 in | Wt 157.0 lb

## 2023-03-11 DIAGNOSIS — Z79899 Other long term (current) drug therapy: Secondary | ICD-10-CM

## 2023-03-11 DIAGNOSIS — G35 Multiple sclerosis: Secondary | ICD-10-CM

## 2023-03-11 DIAGNOSIS — F09 Unspecified mental disorder due to known physiological condition: Secondary | ICD-10-CM

## 2023-03-11 DIAGNOSIS — E559 Vitamin D deficiency, unspecified: Secondary | ICD-10-CM

## 2023-03-11 DIAGNOSIS — R260 Ataxic gait: Secondary | ICD-10-CM

## 2023-03-11 DIAGNOSIS — R41840 Attention and concentration deficit: Secondary | ICD-10-CM

## 2023-03-11 MED ORDER — MODAFINIL 200 MG PO TABS: 200.0000 mg | ORAL_TABLET | Freq: Every day | ORAL | 5 refills | Status: DC

## 2023-03-11 NOTE — Progress Notes (Addendum)
[facility]  PATIENT: Ashley Key DOB: 12/04/1966  REFERRING DOCTOR OR PCP:  London Pepper (PCP); Amy Johnnye Sima (ADD) SOURCE: patient, notes from Dr. Orland Mustard, imaging/lab reports, MRI images on PACS  _________________________________   HISTORICAL  CHIEF COMPLAINT:  Chief Complaint  Patient presents with   Room 10    Pt is here Alone. Pt states that things have been going good. Pt states that she had Covid from January to March.     HISTORY OF PRESENT ILLNESS:  Ashley Key is a 57 y.o. woman with relapsing remitting MS diagnosed 10/2017.  Her main issues involve cognition with reduced ability to learn and focus  Update 03/11/2023 She is on Ocrevus.    She has not had any exacerbation but notes some worsening, especially with memory and fatigue.     Gait and strength are ok.   She notes numbness in her right foot.  It is less than last year.   Gait is mildly off balanced at times an she uses the bannister going downstairs.  Bladder is doing ok.     She has fatigue and reduced focus/attention.   She has neurocognitive deficits noted on neurocognitive testing.  She notes memory issues. She stopped the Adderall.   She has hypothyroid and is on Synthroid.   Due to fatigue, she can only walk 1/2 mile instead of 1-2 miles as she did last year.   She feels winded easily.  She notes her fatigue worsened after COVID in January 2024.  Memory is doing worse.  She needs to rely on lists more.    Last year, she scored 24/30 on the MoCA losing 5 points for STM - 0/5 with no hints but 4/5 with multiple choice.   She scored 25/30 in 2018 losing all points for STM at that time  She has depression and is on Wellbutrin.   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  Her son was in juvenile detention so there is less stress in the house but she is still concerned about how he is going to do when he gets out next month.    After starting  Ocrevus, her thrombocytopenia has resolved.       06/17/2022   10:03 AM 10/15/2017    9:52 AM  Montreal Cognitive Assessment   Visuospatial/ Executive (0/5) 5 5  Naming (0/3) 3 3  Attention: Read list of digits (0/2) 2 2  Attention: Read list of letters (0/1) 1 1  Attention: Serial 7 subtraction starting at 100 (0/3) 3 3  Language: Repeat phrase (0/2) 1 2  Language : Fluency (0/1) 1 1  Abstraction (0/2) 2 2  Delayed Recall (0/5) 0 0  Orientation (0/6) 6 6  Total 24 25  Adjusted Score (based on education)  25     MS History: She noted  difficulties with her memory, focus and attention in 2016/2017.   She noted more difficulties with her work.   MRI showed changes c/w MS.  Physically she had mild gait disturbance    Sh started Ocrevus in 2018 and she has tolerated it well.   She had ITP 06/2014 with a platelet count of 12.  She was treated with steroids and improved (Dr. Benay Spice) [name] there was persistent thrombocytopenia (2018  plt count = 79) and it resolved with OCrevus.    IMAGING REVIEW:  MRI of the cervical spine 10/21/2017 showed a T2 hyperintense focus at the cervicomedullary  junction and mild degenerative changes at C3-C4 and C4-C5.  Normal enhancement pattern.  MRI of the brain 10/10/2017 showed multiple T2/FLAIR hyperintense foci in the juxtacortical, periventricular and deep white matter. Additionally there is a focus in the left thalamus, right midbrain, right pons and a subtle focus in the right cerebellar hemisphere and possibly at the cervicomedullary junction.  One of the juxtacortical foci on the right is mildly hyperintense on diffusion-weighted images and may be more acute or be artifact.   MRI of the brain 08/18/2019 and MRI 02/04/2019 showed no new lesions.   MRI of the brain 05/30/2021 showed T2 hyperintense foci within the brainstem, thalamus and hemispheres in a pattern and configuration consistent with chronic demyelinating plaque associated with multiple  sclerosis.  None of the foci appears to be acute.  They do not enhance.  Compared to the MRI dated 08/18/2019, there are no new lesions.  MRI of the brain 07/02/2022 was unchanged.   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 Based on my clinical interview with the patient and her history, I concur with the diagnostic impressions described in her previous neuropsychological evaluation by Dr. Norton Pastel (dated 05/19/2018). Specifically, it appears that the patient is experiencing mild neurocognitive disorder due to MS and preexisting ADHD.  Psychological testing did not reveal any indication of additional psychiatric component.  Ashley Key is a 57 year old, right-handed, Caucasian female, who reported experiencing cognitive deficits that began approximately 2 years ago. This is in the context of recently diagnosed relapsing-remitting multiple sclerosis.  Test results revealed intact functioning across most cognitive domains and thinking skills assessed during this evaluation with the exception of reduced aspects of verbal learning and memory as well as mildly slowed processing speed. From an emotional standpoint, there appears to be at least a moderate degree of recent depression and mild-to-moderate anxiety.  Ashley Key's performance is consistent with a diagnosis of Mild Neurocognitive Disorder (i.e., mild cognitive impairment). With regard to etiology, these results are likely due to the cognitive consequences of multiple sclerosis and mood disruption.     REVIEW OF SYSTEMS: Constitutional: No fevers, chills, sweats, or change in appetite.   She has fatigue.   Mild insomnia.    Eyes: No visual changes, double vision, eye pain Ear, nose and throat: No hearing loss, ear pain, nasal congestion, sore throat Cardiovascular: No chest pain, palpitations Respiratory:  No shortness of breath at rest or with exertion.   No  wheezes GastrointestinaI: No nausea, vomiting, diarrhea, abdominal pain, fecal incontinence Genitourinary:  Urinary urgency and nocturia. Musculoskeletal:  No neck pain, back pain Integumentary: No rash, pruritus, skin lesions Neurological: as above Psychiatric: mild depression Endocrine: No palpitations, diaphoresis, change in appetite, change in weigh or increased thirst Hematologic/Lymphatic:  No anemia, purpura, petechiae. Allergic/Immunologic: No itchy/runny eyes, nasal congestion, recent allergic reactions, rashes  ALLERGIES: No Known Allergies  HOME MEDICATIONS:  Current Outpatient Medications:    amLODipine (NORVASC) 5 MG tablet, Take 5 mg by mouth daily. , Disp: , Rfl: 2   buPROPion (WELLBUTRIN XL) 150 MG 24 hr tablet, Take 150 mg by mouth every morning., Disp: , Rfl:    CALCIUM-VITAMIN D PO, Take 1 tablet by mouth daily., Disp: , Rfl:    cyclobenzaprine (FLEXERIL) 5 MG tablet, Take 5 mg by mouth at bedtime as needed., Disp: , Rfl:    modafinil (PROVIGIL) 200 MG tablet, Take 1 tablet (200 mg total) by mouth daily., Disp: 30 tablet,  Rfl: 5   Multiple Vitamins-Minerals (MULTIVITAMIN PO), Take 1 tablet by mouth daily., Disp: , Rfl:    PREMARIN vaginal cream, Place 1 application vaginally 2 (two) times a week., Disp: , Rfl:    rosuvastatin (CRESTOR) 5 MG tablet, TAKE 1 TABLET(5 MG) BY MOUTH DAILY, Disp: 30 tablet, Rfl: 0   SYNTHROID 112 MCG tablet, Take 112 mcg by mouth. Alternates daily between 110 and 17mcg, Disp: , Rfl: 5   TURMERIC PO, Take 1 tablet by mouth daily., Disp: , Rfl:    ibuprofen (ADVIL) 800 MG tablet, Take 800 mg by mouth 3 (three) times daily as needed. (Patient not taking: Reported on 03/11/2023), Disp: , Rfl:    ocrelizumab 600 mg in sodium chloride 0.9 % 500 mL, Inject 600 mg into the vein every 6 (six) months.  (Patient not taking: Reported on 03/11/2023), Disp: , Rfl:  No current facility-administered medications for this visit.  Facility-Administered  Medications Ordered in Other Visits:    gadopentetate dimeglumine (MAGNEVIST) injection 15 mL, 15 mL, Intravenous, Once PRN, Naseer Hearn, Nanine Means, MD  PAST MEDICAL HISTORY: Past Medical History:  Diagnosis Date   ADHD (attention deficit hyperactivity disorder)    Arch pain    Arthritis    FEET   Ataxic gait 10/15/2017   Attention deficit 10/15/2017   Blood dyscrasia    NORMAL PLATLETS 50-70'S   Bunion    Chest pain, unspecified 01/18/2016   Cognitive deficit secondary to multiple sclerosis 01/13/2018   Diplopia 08/10/2019   Essential hypertension 01/18/2016   Graves disease    Headache    HISTORY MIGRAINE   Hypertension    Hypothyroidism 01/18/2016   Idiopathic thrombocytopenic purpura 06/27/2014   ITP (idiopathic thrombocytopenic purpura)    Memory loss 10/15/2017   Multiple sclerosis 10/23/2017   Numbness 10/15/2017   Pelvic fracture    Pneumonia    Screening, lipid 01/18/2016   Thrombocytopenia 06/27/2014   White matter abnormality on MRI of brain 10/15/2017    PAST SURGICAL HISTORY: Past Surgical History:  Procedure Laterality Date   DILITATION & CURRETTAGE/HYSTROSCOPY WITH HYDROTHERMAL ABLATION N/A 09/27/2015   Procedure: DILATATION & CURETTAGE/HYSTEROSCOPY WITH HYDROTHERMAL ABLATION;  Surgeon: Thurnell Lose, MD;  Location: [facility];  Service: Gynecology;  Laterality: N/A;  ultrasound guidance    FOOT SURGERY     THYROID RADATION 2016     WISDOM TOOTH EXTRACTION      FAMILY HISTORY: Family History  Problem Relation Age of Onset   CAD Mother    Heart attack Mother    Stroke Father    Heart attack Father    CAD Brother    Heart attack Brother    Cancer Brother        prostate cancer   Heart attack Maternal Grandmother    Stroke Maternal Grandmother    Stroke Paternal Grandmother     SOCIAL HISTORY:  Social History   Socioeconomic History   Marital status: Married    Spouse name: Collier Salina   Number of children: 1   Years of education: Not on file   Highest education  level: Not on file  Occupational History   Occupation: Clinical research associate: [employer]  Tobacco Use   Smoking status: Never   Smokeless tobacco: Never  Substance and Sexual Activity   Alcohol use: Yes    Comment: social   Drug use: No   Sexual activity: Yes  Other Topics Concern   Not on file  Social History Narrative  Married, husband Collier Salina   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   General: The patient is well-developed and well-nourished and in no acute distress.   No erythema in joints.   No heat.    Has mild tenderness to deep palpation over CMC joint at base of thumbs.  Lungs were clear.  Heart had regular rate and rhythm normal S1-S2.  No murmurs  Neurologic Exam  Mental status: The patient is alert and oriented x 3 at the time of the examination.  She appears to have mildly reduced short-term memory and focus today.  (she writes things down).  Speech is normal.  Cranial nerves: Extraocular movements are full. Facial strength and sensation is normal.   No obvious hearing deficits are noted.  Motor:  Muscle bulk is normal.   Muscle tone is normal.. Strength is  5 / 5 in all 4 extremities.   Sensory:   She has symetric sensation today to touch/vibration  Coordination: There is good finger-nose-finger and mildly reduced heel-to-shin, right worse than left.  Gait and station: Station is normal.   The gait is  mildly wide and the tandem gait is wide..  Romberg is mildly positive.  Reflexes: Deep tendon reflexes are symmetric and 2 in arms and 3 in legs bilaterally with spread at the knees.  No ankle clonus.   DIAGNOSTIC DATA (LABS, IMAGING, TESTING) - I reviewed patient records, labs, notes, testing and imaging myself where available.  Lab Results  Component Value Date   WBC 3.3 (L) 06/17/2022   HGB 13.6 09/25/2022   HCT 40.0 09/25/2022   MCV 89 06/17/2022   PLT 225 06/17/2022      Component Value Date/Time   NA 139 09/25/2022 0550   K 3.8 09/25/2022 0550   CL 101 09/25/2022 0550   CO2 25 04/14/2020 1827   GLUCOSE 100 (H) 09/25/2022 0550   BUN 7 09/25/2022 0550   CREATININE 0.80 09/25/2022 0550   CALCIUM 8.7 (L) 04/14/2020 1827   PROT 6.8 04/14/2020 1827   PROT 6.7 08/10/2019 1409   ALBUMIN 3.8 04/14/2020 1827   ALBUMIN 4.7 08/10/2019 1409   AST 28 04/14/2020 1827   ALT 22 04/14/2020 1827   ALKPHOS 62 04/14/2020 1827   BILITOT 0.5 04/14/2020 1827   BILITOT <0.2 08/10/2019 1409   GFRNONAA >60 04/14/2020 1827   GFRAA >60 04/14/2020 1827        ASSESSMENT AND PLAN  High risk medication use - Plan: IgG, IgA, IgM, CBC with Differential/Platelet, CD20 B Cells  Multiple sclerosis - Plan: IgG, IgA, IgM, CBC with Differential/Platelet, CD20 B Cells  Vitamin D deficiency - Plan: VITAMIN D 25 Hydroxy (Vit-D Deficiency, Fractures)  Ataxic gait  Attention deficit  Cognitive deficit secondary to multiple sclerosis   1.   Continue Ocrevus.  Check IgG/IgM, CBC/Diff     Check MRI to determine if subclinical progression and consider switch if occurring.   2.   Will do a trial of modafinil for her fatigue.  If not better, consider going back on Adderall may be at a lower dose  3.   Stay active physically and mentally.  4.   We discussed that her cognitive issues are likely combination of the MS combined with mood and possibly poor sleep.  She is advised to try to get 7 hours of  sleep nightly. 5.   Vitamin D supplementation.  We will check a level today. 6   Return to see me in 6 months or sooner with any new or worsening neurologic symptoms.  40-minute office visit with the majority of the time spent face-to-face for history and physical, discussion/counseling and decision-making.  Additional time with record review and documentation.   Octavis Sheeler A. Felecia Shelling, MD, PhD, FAAN Certified in Neurology, Clinical Neurophysiology, Sleep Medicine, Pain Medicine and Neuroimaging Director, [facility] [address], [place], [place] [address] [phone]

## 2023-03-12 ENCOUNTER — Telehealth: Payer: Self-pay | Admitting: *Deleted

## 2023-03-12 LAB — CBC WITH DIFFERENTIAL/PLATELET
Basophils Absolute: 0.1 10*3/uL (ref 0.0–0.2)
Basos: 1 %
EOS (ABSOLUTE): 0.3 10*3/uL (ref 0.0–0.4)
Eos: 5 %
Hematocrit: 40.3 % (ref 34.0–46.6)
Hemoglobin: 12.9 g/dL (ref 11.1–15.9)
Immature Grans (Abs): 0 10*3/uL (ref 0.0–0.1)
Immature Granulocytes: 0 %
Lymphocytes Absolute: 0.9 10*3/uL (ref 0.7–3.1)
Lymphs: 16 %
MCH: 28 pg (ref 26.6–33.0)
MCHC: 32 g/dL (ref 31.5–35.7)
MCV: 88 fL (ref 79–97)
Monocytes Absolute: 0.6 10*3/uL (ref 0.1–0.9)
Monocytes: 10 %
Neutrophils Absolute: 3.8 10*3/uL (ref 1.4–7.0)
Neutrophils: 68 %
Platelets: 394 10*3/uL (ref 150–450)
RBC: 4.6 x10E6/uL (ref 3.77–5.28)
RDW: 12.3 % (ref 11.7–15.4)
WBC: 5.6 10*3/uL (ref 3.4–10.8)

## 2023-03-12 LAB — VITAMIN D 25 HYDROXY (VIT D DEFICIENCY, FRACTURES): Vit D, 25-Hydroxy: 27.5 ng/mL — ABNORMAL LOW (ref 30.0–100.0)

## 2023-03-12 LAB — IGG, IGA, IGM
IgA/Immunoglobulin A, Serum: 31 mg/dL — ABNORMAL LOW (ref 87–352)
IgG (Immunoglobin G), Serum: 644 mg/dL (ref 586–1602)
IgM (Immunoglobulin M), Srm: 35 mg/dL (ref 26–217)

## 2023-03-12 LAB — CD20 B CELLS
% CD19-B Cells: 0.1 % — ABNORMAL LOW (ref 4.6–22.1)
% CD20-B Cells: 0 % — ABNORMAL LOW (ref 5.0–22.3)

## 2023-03-12 NOTE — Telephone Encounter (Signed)
Submitted PA Modafinil on covermymeds. Key: [id number]. Received instant approval: "[id number];Review Type:Prior Auth;Coverage Start Date:02/10/2023;Coverage End Date:03/11/2024;"

## 2023-03-19 DIAGNOSIS — G35 Multiple sclerosis: Secondary | ICD-10-CM | POA: Diagnosis not present

## 2023-03-24 ENCOUNTER — Ambulatory Visit
Admission: RE | Admit: 2023-03-24 | Discharge: 2023-03-24 | Disposition: A | Discharge status: Home / Self Care | Payer: Medicare Other | Source: Ambulatory Visit | Attending: Family Medicine | Admitting: Family Medicine

## 2023-03-24 ENCOUNTER — Other Ambulatory Visit: Payer: Self-pay | Admitting: Family Medicine

## 2023-03-24 DIAGNOSIS — R079 Chest pain, unspecified: Secondary | ICD-10-CM | POA: Diagnosis not present

## 2023-03-24 DIAGNOSIS — R0781 Pleurodynia: Secondary | ICD-10-CM

## 2023-03-24 DIAGNOSIS — N644 Mastodynia: Secondary | ICD-10-CM | POA: Diagnosis not present

## 2023-03-26 ENCOUNTER — Other Ambulatory Visit: Payer: Self-pay | Admitting: Family Medicine

## 2023-03-26 DIAGNOSIS — N644 Mastodynia: Secondary | ICD-10-CM

## 2023-04-14 NOTE — Progress Notes (Unsigned)
Synopsis: Referred for abnormal x ray by Farris Has, MD  Subjective:   PATIENT ID: Ashley Key GENDER: female DOB: [birth year], MRN: [id number]  No chief complaint on file.  57yF with history of HTN, hypothyroid, MS, ITP referred for scar on XR ribs  Otherwise pertinent review of systems is negative.  Past Medical History:  Diagnosis Date   ADHD (attention deficit hyperactivity disorder)    Arch pain    Arthritis    FEET   Ataxic gait 10/15/2017   Attention deficit 10/15/2017   Blood dyscrasia    NORMAL PLATLETS 50-70'S   Bunion    Chest pain, unspecified 01/18/2016   Cognitive deficit secondary to multiple sclerosis (HCC) 01/13/2018   Diplopia 08/10/2019   Essential hypertension 01/18/2016   Graves disease    Headache    HISTORY MIGRAINE   Hypertension    Hypothyroidism 01/18/2016   Idiopathic thrombocytopenic purpura (HCC) 06/27/2014   ITP (idiopathic thrombocytopenic purpura)    Memory loss 10/15/2017   Multiple sclerosis (HCC) 10/23/2017   Numbness 10/15/2017   Pelvic fracture (HCC)    Pneumonia    Screening, lipid 01/18/2016   Thrombocytopenia (HCC) 06/27/2014   White matter abnormality on MRI of brain 10/15/2017     Family History  Problem Relation Age of Onset   CAD Mother    Heart attack Mother    Stroke Father    Heart attack Father    CAD Brother    Heart attack Brother    Cancer Brother        prostate cancer   Heart attack Maternal Grandmother    Stroke Maternal Grandmother    Stroke Paternal Grandmother      Past Surgical History:  Procedure Laterality Date   DILITATION & CURRETTAGE/HYSTROSCOPY WITH HYDROTHERMAL ABLATION N/A 09/27/2015   Procedure: DILATATION & CURETTAGE/HYSTEROSCOPY WITH HYDROTHERMAL ABLATION;  Surgeon: Geryl Rankins, MD;  Location: [facility];  Service: Gynecology;  Laterality: N/A;  ultrasound guidance    FOOT SURGERY     THYROID RADATION 2016     WISDOM TOOTH EXTRACTION      Social History   Socioeconomic History   Marital  status: Married    Spouse name: Theron Arista   Number of children: 1   Years of education: Not on file   Highest education level: Not on file  Occupational History   Occupation: Engineer, maintenance (IT): [employer]  Tobacco Use   Smoking status: Never   Smokeless tobacco: Never  Substance and Sexual Activity   Alcohol use: Yes    Comment: social   Drug use: No   Sexual activity: Yes  Other Topics Concern   Not on file  Social History Narrative   Married, husband Theron Arista   Pharmacist, community in [place]   Children- #1 son (10)   Pet cat and getting new dog today            Epworth Sleepiness Scale = 9 (as of 01/22/2016)   Social Determinants of Health   Financial Resource Strain: Not on file  Food Insecurity: Not on file  Transportation Needs: Not on file  Physical Activity: Not on file  Stress: Not on file  Social Connections: Not on file  Intimate Partner Violence: Not on file     No Known Allergies   Outpatient Medications Prior to Visit  Medication Sig Dispense Refill   amLODipine (NORVASC) 5 MG tablet Take 5 mg by mouth daily.   2   buPROPion [facility]  XL) 150 MG 24 hr tablet Take 150 mg by mouth every morning.     CALCIUM-VITAMIN D PO Take 1 tablet by mouth daily.     cyclobenzaprine (FLEXERIL) 5 MG tablet Take 5 mg by mouth at bedtime as needed.     ibuprofen (ADVIL) 800 MG tablet Take 800 mg by mouth 3 (three) times daily as needed. (Patient not taking: Reported on 03/11/2023)     modafinil (PROVIGIL) 200 MG tablet Take 1 tablet (200 mg total) by mouth daily. 30 tablet 5   Multiple Vitamins-Minerals (MULTIVITAMIN PO) Take 1 tablet by mouth daily.     ocrelizumab 600 mg in sodium chloride 0.9 % 500 mL Inject 600 mg into the vein every 6 (six) months.  (Patient not taking: Reported on 03/11/2023)     PREMARIN vaginal cream Place 1 application vaginally 2 (two) times a week.     rosuvastatin (CRESTOR) 5 MG tablet TAKE 1 TABLET(5 MG) BY MOUTH DAILY 30 tablet  0   SYNTHROID 112 MCG tablet Take 112 mcg by mouth. Alternates daily between 110 and  5   TURMERIC PO Take 1 tablet by mouth daily.     Facility-Administered Medications Prior to Visit  Medication Dose Route Frequency Provider Last Rate Last Admin   gadopentetate dimeglumine (MAGNEVIST) injection 15 mL  15 mL Intravenous Once PRN [name], Pearletha Furl, MD           Objective:   Physical Exam:  General appearance: 57 y.o., female, NAD, conversant  Eyes: anicteric sclerae; PERRL, tracking appropriately HENT: NCAT; MMM Neck: Trachea midline; no lymphadenopathy, no JVD Lungs: CTAB, no crackles, no wheeze, with normal respiratory effort CV: RRR, no murmur  Abdomen: Soft, non-tender; non-distended, BS present  Extremities: No peripheral edema, warm Skin: Normal turgor and texture; no rash Psych: Appropriate affect Neuro: Alert and oriented to person and place, no focal deficit     There were no vitals filed for this visit.   on *** LPM *** RA BMI Readings from Last 3 Encounters:  03/11/23 25.34 kg/m  09/25/22 26.63 kg/m  06/17/22 26.79 kg/m   Wt Readings from Last 3 Encounters:  03/11/23 157 lb (71.2 kg)  09/25/22 165 lb (74.8 kg)  06/17/22 166 lb (75.3 kg)     CBC    Component Value Date/Time   WBC 5.6 03/11/2023 1022   WBC 3.4 (L) 04/14/2020 1827   RBC 4.60 03/11/2023 1022   RBC 4.78 04/14/2020 1827   HGB 12.9 03/11/2023 1022   HGB 13.4 06/30/2017 1507   HCT 40.3 03/11/2023 1022   HCT 40.1 06/30/2017 1507   PLT 394 03/11/2023 1022   MCV 88 03/11/2023 1022   MCV 89.7 06/30/2017 1507   MCH 28.0 03/11/2023 1022   MCH 29.7 04/14/2020 1827   MCHC 32.0 03/11/2023 1022   MCHC 32.9 04/14/2020 1827   RDW 12.3 03/11/2023 1022   RDW 12.6 06/30/2017 1507   LYMPHSABS 0.9 03/11/2023 1022   LYMPHSABS 1.2 06/30/2017 1507   MONOABS 0.4 12/29/2017 0925   MONOABS 0.3 06/30/2017 1507   EOSABS 0.3 03/11/2023 1022   BASOSABS 0.1 03/11/2023 1022   BASOSABS 0.0  06/30/2017 1507    ***  Chest Imaging: CXR ribs with plate-like atelectasis that probably corresponds to area seen on CTA Chest 09/25/22  CT Cardiac scoring 02/20/16 calcium score 0  Pulmonary Functions Testing Results:     No data to display            Echocardiogram:  ECG treadmill 2017: The patient exercised following the Bruce protocol.  The patient reported fatigue during the stress test. The patient experienced no angina during the stress test.   The test was stopped because the patient complained of fatigue and shortness of breath.   Blood pressure and heart rate demonstrated a normal response to exercise. Overall, the patient's exercise capacity was normal.   85% of maximum heart rate was achieved after 9 minutes. Recovery time: 7 minutes.  [facility] Treadmill Score: low risk The patient's response to exercise was adequate for diagnosis.   Heart Catheterization: ***    Assessment & Plan:    Plan:      Omar Person, MD [facility] 04/14/2023 7:26 AM

## 2023-04-16 ENCOUNTER — Ambulatory Visit (INDEPENDENT_AMBULATORY_CARE_PROVIDER_SITE_OTHER): Payer: BC Managed Care – PPO | Admitting: Student

## 2023-04-16 ENCOUNTER — Encounter: Payer: Self-pay | Admitting: Student

## 2023-04-16 VITALS — BP 120/88 | HR 87 | Temp 97.8°F | Ht 66.0 in | Wt 155.6 lb

## 2023-04-16 DIAGNOSIS — J9811 Atelectasis: Secondary | ICD-10-CM | POA: Diagnosis not present

## 2023-04-16 DIAGNOSIS — R0781 Pleurodynia: Secondary | ICD-10-CM

## 2023-04-16 NOTE — Patient Instructions (Addendum)
-   If still noticing limitation with your exercise in the next 3-4 months below your pre-covid-19 baseline, or if very frequent cough with phlegm then make an appointment

## 2023-04-30 ENCOUNTER — Encounter: Payer: Self-pay | Admitting: Student

## 2023-04-30 ENCOUNTER — Ambulatory Visit (INDEPENDENT_AMBULATORY_CARE_PROVIDER_SITE_OTHER): Payer: BC Managed Care – PPO | Admitting: Student

## 2023-04-30 VITALS — BP 106/70 | HR 95 | Temp 98.0°F | Ht 66.0 in | Wt 153.8 lb

## 2023-04-30 DIAGNOSIS — R053 Chronic cough: Secondary | ICD-10-CM | POA: Diagnosis not present

## 2023-04-30 DIAGNOSIS — R0609 Other forms of dyspnea: Secondary | ICD-10-CM | POA: Diagnosis not present

## 2023-04-30 MED ORDER — ALBUTEROL SULFATE HFA 108 (90 BASE) MCG/ACT IN AERS: 1.0000 | INHALATION_SPRAY | Freq: Four times a day (QID) | RESPIRATORY_TRACT | 6 refills | Status: DC | PRN

## 2023-04-30 NOTE — Progress Notes (Signed)
Synopsis: Referred for abnormal x ray by Farris Has, MD  Subjective:   PATIENT ID: Ashley Key GENDER: female DOB: 1966/06/06, MRN: [id number]  Chief Complaint  Patient presents with   Follow-up    She c/o cough x 2 days- worse when she is lying down. Cough is non prod.    57yF with history of HTN, hypothyroid, MS, ITP referred for scar on XR ribs  Had covid about 6 months ago - wasn't hospitalized-  and ever since then she has had some right sided CP. Worse with deep breath and cough.   Tried going back for aerobics about a month ago and she still feels winded with less than usual intensity aerobic exercis - it is improving. Doesn't have much of a cough currently.   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 Wasn [place] most of her adult life. No international travel before this. Has 3 cats. She never smoked, no MJ/vaping.   Interval HPI Increased cough especially at night. Coughing up some phlegm over last couple of days. She still has some limitation with DOE relative to her baseline, not able to exercise with same intensity during aerobic exercise.   Minor extent of sinus congestion/postnasal drainage. Does think she may have some reflux sensation.   Otherwise pertinent review of systems is negative.  Past Medical History:  Diagnosis Date   ADHD (attention deficit hyperactivity disorder)    Arch pain    Arthritis    FEET   Ataxic gait 10/15/2017   Attention deficit 10/15/2017   Blood dyscrasia    NORMAL PLATLETS 50-70'S   Bunion    Chest pain, unspecified 01/18/2016   Cognitive deficit secondary to multiple sclerosis (HCC) 01/13/2018   Diplopia 08/10/2019   Essential hypertension 01/18/2016   Graves disease    Headache    HISTORY MIGRAINE   Hypertension    Hypothyroidism 01/18/2016   Idiopathic thrombocytopenic purpura (HCC) 06/27/2014    ITP (idiopathic thrombocytopenic purpura)    Memory loss 10/15/2017   Multiple sclerosis (HCC) 10/23/2017   Numbness 10/15/2017   Pelvic fracture (HCC)    Pneumonia    Screening, lipid 01/18/2016   Thrombocytopenia (HCC) 06/27/2014   White matter abnormality on MRI of brain 10/15/2017     Family History  Problem Relation Age of Onset   CAD Mother    Heart attack Mother    Stroke Father    Heart attack Father    CAD Brother    Heart attack Brother    Cancer Brother        prostate cancer   Heart attack Maternal Grandmother    Stroke Maternal Grandmother    Stroke Paternal Grandmother      Past Surgical History:  Procedure Laterality Date   DILITATION & CURRETTAGE/HYSTROSCOPY WITH HYDROTHERMAL ABLATION N/A 09/27/2015   Procedure: DILATATION & CURETTAGE/HYSTEROSCOPY WITH HYDROTHERMAL ABLATION;  Surgeon: Geryl Rankins, MD;  Location: [facility];  Service: Gynecology;  Laterality: N/A;  ultrasound guidance    FOOT SURGERY     THYROID RADATION 2016     WISDOM TOOTH EXTRACTION      Social History   Socioeconomic History   Marital status: Married    Spouse name: Theron Arista   Number of children: 1   Years of education: Not on file   Highest education level: Not on file  Occupational History  Occupation: Engineer, maintenance (IT): [employer]  Tobacco Use   Smoking status: Never   Smokeless tobacco: Never  Substance and Sexual Activity   Alcohol use: Yes    Comment: social   Drug use: No   Sexual activity: Yes  Other Topics Concern   Not on file  Social History Narrative   Married, husband Theron Arista   Pharmacist, community in [place]   Children- #1 son (10)   Pet cat and getting new dog today            Epworth Sleepiness Scale = 9 (as of 01/22/2016)   Social Determinants of Health   Financial Resource Strain: Not on file  Food Insecurity: Not on file  Transportation Needs: Not on file  Physical Activity: Not on file  Stress: Not on file  Social Connections:  Not on file  Intimate Partner Violence: Not on file     No Known Allergies   Outpatient Medications Prior to Visit  Medication Sig Dispense Refill   amLODipine (NORVASC) 5 MG tablet Take 5 mg by mouth daily.   2   buPROPion (WELLBUTRIN XL) 150 MG 24 hr tablet Take 150 mg by mouth every morning.     CALCIUM-VITAMIN D PO Take 1 tablet by mouth daily.     Multiple Vitamins-Minerals (MULTIVITAMIN PO) Take 1 tablet by mouth daily.     ocrelizumab 600 mg in sodium chloride 0.9 % 500 mL Inject 600 mg into the vein every 6 (six) months.     rosuvastatin (CRESTOR) 5 MG tablet TAKE 1 TABLET(5 MG) BY MOUTH DAILY 30 tablet 0   SYNTHROID 112 MCG tablet Take 112 mcg by mouth. Alternates daily between 110 and  5   TURMERIC PO Take 1 tablet by mouth daily.     modafinil (PROVIGIL) 200 MG tablet Take 1 tablet (200 mg total) by mouth daily. 30 tablet 5   PREMARIN vaginal cream Place 1 application  vaginally 2 (two) times a week. (Patient not taking: Reported on 04/30/2023)     cyclobenzaprine (FLEXERIL) 5 MG tablet Take 5 mg by mouth at bedtime as needed.     ibuprofen (ADVIL) 800 MG tablet Take 800 mg by mouth 3 (three) times daily as needed.     Facility-Administered Medications Prior to Visit  Medication Dose Route Frequency Provider Last Rate Last Admin   gadopentetate dimeglumine (MAGNEVIST) injection 15 mL  15 mL Intravenous Once PRN [name], Pearletha Furl, MD           Objective:   Physical Exam:  General appearance: 57 y.o., female, NAD, conversant  Eyes: anicteric sclerae; PERRL, tracking appropriately HENT: NCAT; MMM Neck: Trachea midline; no lymphadenopathy, no JVD Lungs: CTAB, no crackles, no wheeze, with normal respiratory effort CV: RRR, no murmur  Abdomen: Soft, non-tender; non-distended, BS present  Extremities: No peripheral edema, warm Skin: Normal turgor and texture; no rash Psych: Appropriate affect Neuro: Alert and oriented to person and place, no focal deficit      Vitals:   04/30/23 1315  BP: 106/70  Pulse: 95  Temp: 98 F (36.7 C)  TempSrc: Oral  SpO2: 98%  Weight: 153 lb 12.8 oz (69.8 kg)  Height: 5\' 6"  (1.676 m)    98% on RA BMI Readings from Last 3 Encounters:  04/30/23 24.82 kg/m  04/16/23 25.11 kg/m  03/11/23 25.34 kg/m   Wt Readings from Last 3 Encounters:  04/30/23 153 lb 12.8 oz (69.8 kg)  04/16/23 155 lb 9.6  oz (70.6 kg)  03/11/23 157 lb (71.2 kg)     CBC    Component Value Date/Time   WBC 5.6 03/11/2023 1022   WBC 3.4 (L) 04/14/2020 1827   RBC 4.60 03/11/2023 1022   RBC 4.78 04/14/2020 1827   HGB 12.9 03/11/2023 1022   HGB 13.4 06/30/2017 1507   HCT 40.3 03/11/2023 1022   HCT 40.1 06/30/2017 1507   PLT 394 03/11/2023 1022   MCV 88 03/11/2023 1022   MCV 89.7 06/30/2017 1507   MCH 28.0 03/11/2023 1022   MCH 29.7 04/14/2020 1827   MCHC 32.0 03/11/2023 1022   MCHC 32.9 04/14/2020 1827   RDW 12.3 03/11/2023 1022   RDW 12.6 06/30/2017 1507   LYMPHSABS 0.9 03/11/2023 1022   LYMPHSABS 1.2 06/30/2017 1507   MONOABS 0.4 12/29/2017 0925   MONOABS 0.3 06/30/2017 1507   EOSABS 0.3 03/11/2023 1022   BASOSABS 0.1 03/11/2023 1022   BASOSABS 0.0 06/30/2017 1507      Chest Imaging: CXR ribs with plate-like atelectasis that probably corresponds to area seen on CTA Chest 09/25/22  CT Cardiac scoring 02/20/16 calcium score 0  Pulmonary Functions Testing Results:     No data to display            Echocardiogram:   ECG treadmill 2017: The patient exercised following the Bruce protocol.  The patient reported fatigue during the stress test. The patient experienced no angina during the stress test.   The test was stopped because the patient complained of fatigue and shortness of breath.   Blood pressure and heart rate demonstrated a normal response to exercise. Overall, the patient's exercise capacity was normal.   85% of maximum heart rate was achieved after 9 minutes. Recovery time: 7 minutes.   Duke Treadmill Score: low risk The patient's response to exercise was adequate for diagnosis.      Assessment & Plan:   # Plate-like atelectasis Scar related to covid infection that corresponds to area seen on CTA Chest 09/2022  # Pleuritic R CP Decent chance she may have had nondisplaced rib fx related to severity/persistence of cough, now improved  # Chronic cough # DOE Cough may reflect post infectious reactive airways and/or LPR.   Plan: - albuterol 1-2 puffs up to 4 times daily as needed, and would use 1-2 puffs 5-20 minutes before exercise - declines trial of ppi - PFTs on same day as next visit in 8 weeks  RTC 8 weeks with Worthy Flank, MD [facility] 04/30/2023 3:18 PM

## 2023-04-30 NOTE — Patient Instructions (Signed)
-   albuterol 1-2 puffs up to 4 times daily as needed, and would use 1-2 puffs 5-20 minutes before exercise - PFTs on same day as next visit in 8 weeks

## 2023-05-06 ENCOUNTER — Telehealth: Payer: Self-pay | Admitting: Student

## 2023-05-06 NOTE — Telephone Encounter (Signed)
Pt has been seeing Dr.Meier for SOB last seen 04/30/23 She is experiencing sob and coughing, no other symptoms.  Became worse then her baseline Friday 05/02/23 Pt has been taking medasinil 200mg  for cough and her albuterol inhaler in the morning.   Dr. Sherene Sires please advise

## 2023-05-06 NOTE — Telephone Encounter (Signed)
Called and spoke with patient. She will come in tomorrow at 830am.   Nothing further needed at time of call.

## 2023-05-06 NOTE — Telephone Encounter (Signed)
Patient still having symptoms of cough and shortness of breath. Pharmacy is [facility] [address]. Patient phone number is [phone].

## 2023-05-06 NOTE — Telephone Encounter (Signed)
   Let her know my Grandmother lived in [place] and I'd enjoy meeting her if she'll come in and let me see if can help but little to over over the phone    See if tried delsym 2 tsp bid otc yet and offer to work in tomorrow at 830 am with all meds in hand to regroup  or sometime on Friday May 30th but must bring all active meds to ov.

## 2023-05-07 ENCOUNTER — Encounter: Payer: Self-pay | Admitting: Internal Medicine

## 2023-05-07 ENCOUNTER — Ambulatory Visit (INDEPENDENT_AMBULATORY_CARE_PROVIDER_SITE_OTHER): Payer: BC Managed Care – PPO | Admitting: Internal Medicine

## 2023-05-07 ENCOUNTER — Ambulatory Visit (INDEPENDENT_AMBULATORY_CARE_PROVIDER_SITE_OTHER): Payer: BC Managed Care – PPO

## 2023-05-07 VITALS — BP 110/76 | HR 98 | Temp 97.8°F | Ht 66.0 in | Wt 151.8 lb

## 2023-05-07 DIAGNOSIS — R059 Cough, unspecified: Secondary | ICD-10-CM | POA: Diagnosis not present

## 2023-05-07 DIAGNOSIS — R058 Other specified cough: Secondary | ICD-10-CM

## 2023-05-07 DIAGNOSIS — J189 Pneumonia, unspecified organism: Secondary | ICD-10-CM | POA: Diagnosis not present

## 2023-05-07 MED ORDER — PANTOPRAZOLE SODIUM 40 MG PO TBEC
40.0000 mg | DELAYED_RELEASE_TABLET | Freq: Every day | ORAL | 2 refills | Status: DC
Start: 2023-05-07 — End: 2023-05-23

## 2023-05-07 MED ORDER — TRAMADOL HCL 50 MG PO TABS
50.0000 mg | ORAL_TABLET | ORAL | 2 refills | Status: AC | PRN
Start: 2023-05-07 — End: 2023-05-22

## 2023-05-07 MED ORDER — FAMOTIDINE 20 MG PO TABS
ORAL_TABLET | ORAL | 11 refills | Status: DC
Start: 2023-05-07 — End: 2023-05-23

## 2023-05-07 MED ORDER — PREDNISONE 10 MG PO TABS
ORAL_TABLET | ORAL | 0 refills | Status: DC
Start: 2023-05-07 — End: 2023-05-23

## 2023-05-07 NOTE — Progress Notes (Unsigned)
Ashley Key, female    DOB: 04-Aug-1966   MRN: [id number]   Brief patient profile:  27 yowf from [facility] [place]  never smoker referred to pulmonary clinic 05/07/2023 for refractory cough since 3rd week in May  though saw Dr Thora Lance 04/30/23  with main concern "not right since covid 19" Sep 25 2022 with R cp and doe and main rec was saba prn plus schedule pfts/ declined rx for gerd.    History of Present Illness  05/07/2023  Pulmonary/ 1st office eval/Algernon Mundie / only on prn alb "helps a little"  Chief Complaint  Patient presents with   Acute Visit    Cough and chest pain on right side.  Dyspnea  breathing heavy x 5 days  Dyspnea:  worse since onset of cough assoc with worsening R flank pain as well esp with cough  Cough: abrupt onset dry worse after supper and at hs  Sleep: 30 degrees electric  SABA use: some better    No obvious day to day or daytime pattern/variability or assoc excess/ purulent sputum or mucus plugs or hemoptysis or  chest tightness, subjective wheeze or overt sinus or hb symptoms.    Also denies any obvious fluctuation of symptoms with weather or environmental changes or other aggravating or alleviating factors except as outlined above   No unusual exposure hx or h/o childhood pna/ asthma or knowledge of premature birth.  Current Allergies, Complete Past Medical History, Past Surgical History, Family History, and Social History were reviewed in [employer] record.  ROS  The following are not active complaints unless bolded Hoarseness, sore throat, dysphagia, dental problems, itching, sneezing,  nasal congestion or discharge of excess mucus or purulent secretions, ear ache,   fever, chills, sweats, unintended wt loss or wt gain, classically  exertional cp,  orthopnea pnd or arm/hand swelling  or leg swelling, presyncope, palpitations, abdominal pain, anorexia, nausea, vomiting, diarrhea  or change in bowel habits or change in bladder habits, change in  stools or change in urine, dysuria, hematuria,  rash, arthralgias, visual complaints, headache, numbness, weakness or ataxia or problems with walking or coordination,  change in mood or  memory.            Past Medical History:  Diagnosis Date   ADHD (attention deficit hyperactivity disorder)    Arch pain    Arthritis    FEET   Ataxic gait 10/15/2017   Attention deficit 10/15/2017   Blood dyscrasia    NORMAL PLATLETS 50-70'S   Bunion    Chest pain, unspecified 01/18/2016   Cognitive deficit secondary to multiple sclerosis (HCC) 01/13/2018   Diplopia 08/10/2019   Essential hypertension 01/18/2016   Graves disease    Headache    HISTORY MIGRAINE   Hypertension    Hypothyroidism 01/18/2016   Idiopathic thrombocytopenic purpura (HCC) 06/27/2014   ITP (idiopathic thrombocytopenic purpura)    Memory loss 10/15/2017   Multiple sclerosis (HCC) 10/23/2017   Numbness 10/15/2017   Pelvic fracture (HCC)    Pneumonia    Screening, lipid 01/18/2016   Thrombocytopenia (HCC) 06/27/2014   White matter abnormality on MRI of brain 10/15/2017    Outpatient Medications Prior to Visit  Medication Sig Dispense Refill   albuterol (VENTOLIN HFA) 108 (90 Base) MCG/ACT inhaler Inhale 1-2 puffs into the lungs every 6 (six) hours as needed for wheezing or shortness of breath. 8 g 6   amLODipine (NORVASC) 5 MG tablet Take 5 mg by mouth daily.  2   buPROPion (WELLBUTRIN XL) 150 MG 24 hr tablet Take 150 mg by mouth every morning.     CALCIUM-VITAMIN D PO Take 1 tablet by mouth daily.     modafinil (PROVIGIL) 200 MG tablet Take 1 tablet (200 mg total) by mouth daily. 30 tablet 5   Multiple Vitamins-Minerals (MULTIVITAMIN PO) Take 1 tablet by mouth daily.     ocrelizumab 600 mg in sodium chloride 0.9 % 500 mL Inject 600 mg into the vein every 6 (six) months.     PREMARIN vaginal cream Place 1 application  vaginally 2 (two) times a week.     rosuvastatin (CRESTOR) 5 MG tablet TAKE 1 TABLET(5 MG) BY MOUTH DAILY 30 tablet 0    SYNTHROID 112 MCG tablet Take 112 mcg by mouth. Alternates daily between 110 and  5   TURMERIC PO Take 1 tablet by mouth daily.     Facility-Administered Medications Prior to Visit  Medication Dose Route Frequency Provider Last Rate Last Admin   gadopentetate dimeglumine (MAGNEVIST) injection 15 mL  15 mL Intravenous Once PRN [name], Pearletha Furl, MD         Objective:     BP 110/76 (BP Location: Left Arm, Patient Position: Sitting, Cuff Size: Normal)   Pulse 98   Temp 97.8 F (36.6 C) (Oral)   Ht 5\' 6"  (1.676 m)   Wt 151 lb 12.8 oz (68.9 kg)   SpO2 97%   BMI 24.50 kg/m   SpO2: 97 % RA  Animated pleasant amb wf nad    HEENT : Oropharynx  clear     Nasal turbinates nl    NECK :  without  apparent JVD/ palpable Nodes/TM    LUNGS: no acc muscle use,  Nl contour chest which is clear to A and P bilaterally without cough on insp or exp maneuvers   CV:  RRR  no s3 or murmur or increase in P2, and no edema   ABD:  soft and nontender with nl inspiratory excursion in the supine position. No bruits or organomegaly appreciated   MS:  Nl gait/ ext warm without deformities Or obvious joint restrictions  calf tenderness, cyanosis or clubbing    SKIN: warm and dry without lesions    NEURO:  alert, approp, nl sensorium with  no motor or cerebellar deficits apparent.    CXR PA and Lateral:   05/07/2023 :    I personally reviewed images and impression is as follows:     R hazy as dz likely RUL     Assessment   Upper airway cough syndrome Onset in mid May 2024 - cyclical cough rx  05/07/2023 >>>   Of the three most common causes of  Sub-acute / recurrent or chronic cough, only one (GERD)  can actually contribute to/ trigger  the other two (asthma and post nasal drip syndrome)  and perpetuate the cylce of cough.  While not intuitively obvious, many patients with chronic low grade reflux do not cough until there is a primary insult that disturbs the protective epithelial  barrier and exposes sensitive nerve endings.   This is typically viral but can due to PNDS and  either may apply here.     The point is that once this occurs, it is difficult to eliminate the cycle  using anything but a maximally effective acid suppression regimen at least in the short run, accompanied by an appropriate diet to address non acid GERD and control / eliminate the cough  itself for at least 3 days. With tramadol, eliminate pnds with 1st gen H1 blockers per guidelines >>> also  added 6 day taper off  Prednisone starting at 40 mg per day in case of component of Th-2 driven upper or lower airways inflammation (if cough responds short term only to relapse before return while will on full rx for uacs (as above), then  that would point to allergic rhinitis/ asthma or eos bronchitis as alternative dx)    >>> f/u with Dr Thora Lance as planned and here prn   CAP vs BOOP See cxr 05/07/2023  - levaquin 500 mg daily x 7 days then cxr at 2 weeks  She has had R cp with coughing since boop but now has hazy as dz on same side c/w CAP vs BOOP - BOOP does not usually cause cp unless cough induces MSCP so rx for now with levaquin then regroup in 2 weeks         Each maintenance medication was reviewed in detail including emphasizing most importantly the difference between maintenance and prns and under what circumstances the prns are to be triggered using an action plan format where appropriate.  Total time for H and P, chart review, counseling,  and generating customized AVS unique to this office visit / same day charting > 40 min with pt new to me          Sandrea Hughs, MD 05/07/2023

## 2023-05-07 NOTE — Assessment & Plan Note (Signed)
Onset in mid May 2024 - cyclical cough rx  05/07/2023 >>>   Of the three most common causes of  Sub-acute / recurrent or chronic cough, only one (GERD)  can actually contribute to/ trigger  the other two (asthma and post nasal drip syndrome)  and perpetuate the cylce of cough.  While not intuitively obvious, many patients with chronic low grade reflux do not cough until there is a primary insult that disturbs the protective epithelial barrier and exposes sensitive nerve endings.   This is typically viral but can due to PNDS and  either may apply here.     The point is that once this occurs, it is difficult to eliminate the cycle  using anything but a maximally effective acid suppression regimen at least in the short run, accompanied by an appropriate diet to address non acid GERD and control / eliminate the cough itself for at least 3 days. With tramadol, eliminate pnds with 1st gen H1 blockers per guidelines >>> also  added 6 day taper off  Prednisone starting at 40 mg per day in case of component of Th-2 driven upper or lower airways inflammation (if cough responds short term only to relapse before return while will on full rx for uacs (as above), then  that would point to allergic rhinitis/ asthma or eos bronchitis as alternative dx)    >>> f/u with Dr Thora Lance as planned and here prn

## 2023-05-07 NOTE — Patient Instructions (Addendum)
The key to effective treatment for your cough is eliminating the non-stop cycle of cough you're stuck in long enough to let your airway heal completely and then see if there is anything still making you cough once you stop the cough suppression, but this should take no more than 5 days to figure out  First take delsym two tsp every 12 hours and supplement if needed with  tramadol 50 mg up to 2 every 4 hours to suppress the urge to cough at all or even clear your throat. Swallowing water or using ice chips/non mint and menthol containing candies (such as lifesavers or sugarless jolly ranchers) are also effective.  You should rest your voice and avoid activities that you know make you cough.  Once you have eliminated the cough for 3 straight days try reducing the tramadol first,  then the delsym as tolerated.    Prednisone 10 mg take  4 each am x 2 days,   2 each am x 2 days,  1 each am x 2 days and stop (this is to eliminate allergies and inflammation from coughing)  Protonix (pantoprazole) Take 30-60 min before first meal of the day and Pepcid 20 mg one bedtime plus chlorpheniramine 4 mg x 2 an hour before (available over the counter)  until cough is completely gone for at least a week without the need for cough suppression  GERD (REFLUX)  is an extremely common cause of respiratory symptoms, many times with no significant heartburn at all.    It can be treated with medication, but also with lifestyle changes including avoidance of late meals, excessive alcohol, smoking cessation, and avoid fatty foods, chocolate, peppermint, colas, red wine, and acidic juices such as orange juice.  NO MINT OR MENTHOL PRODUCTS SO NO COUGH DROPS  USE HARD CANDY INSTEAD (jolley ranchers or Stover's or Lifesavers (all available in sugarless versions) NO OIL BASED VITAMINS - use powdered substitutes.   Please remember to go to the  x-ray department  for your tests - we will call you with the results when they are  available   Keep follow up with Dr Thora Lance - see me as needed - bring all medications  Add:  new as dz R mid lung > rx levaquin 500 mg daily x 7 days and recheck cxr in 2 weeks

## 2023-05-08 ENCOUNTER — Telehealth: Payer: Self-pay

## 2023-05-08 ENCOUNTER — Encounter: Payer: Self-pay | Admitting: Internal Medicine

## 2023-05-08 ENCOUNTER — Telehealth: Payer: Self-pay | Admitting: Internal Medicine

## 2023-05-08 DIAGNOSIS — J189 Pneumonia, unspecified organism: Secondary | ICD-10-CM | POA: Insufficient documentation

## 2023-05-08 DIAGNOSIS — Z01419 Encounter for gynecological examination (general) (routine) without abnormal findings: Secondary | ICD-10-CM | POA: Diagnosis not present

## 2023-05-08 DIAGNOSIS — Z124 Encounter for screening for malignant neoplasm of cervix: Secondary | ICD-10-CM | POA: Diagnosis not present

## 2023-05-08 NOTE — Telephone Encounter (Signed)
Per Dr. Sherene Sires: See addendum chart note from [facility] 05/07/2023.  Please call patient and notify of result and abt called in to pharmacy.  Per chart note:   Add:  new as dz R mid lung > rx levaquin 500 mg daily x 7 days and recheck cxr in 2 weeks      See cxr 05/07/2023  - levaquin 500 mg daily x 7 days then cxr at 2 weeks   She has had R cp with coughing since boop but now has hazy as dz on same side c/w CAP vs BOOP - BOOP does not usually cause cp unless cough induces MSCP so rx for now with levaquin then regroup in 2 weeks.  05/08/2023  Called patient.  Gave all information and notified patient rx called into [facility].  Patient verbalized understanding.  Patient wants a note to be out of work due to increased fatigue.  Wants this printed and will pick up when available.  Will ask Dr. Sherene Sires regarding work note.

## 2023-05-08 NOTE — Assessment & Plan Note (Signed)
See cxr 05/07/2023  - levaquin 500 mg daily x 7 days then cxr at 2 weeks  She has had R cp with coughing since boop but now has hazy as dz on same side c/w CAP vs BOOP - BOOP does not usually cause cp unless cough induces MSCP so rx for now with levaquin then regroup in 2 weeks         Each maintenance medication was reviewed in detail including emphasizing most importantly the difference between maintenance and prns and under what circumstances the prns are to be triggered using an action plan format where appropriate.  Total time for H and P, chart review, counseling,  and generating customized AVS unique to this office visit / same day charting > 40 min with pt new to me

## 2023-05-08 NOTE — Telephone Encounter (Signed)
Closing encounter. NFN 

## 2023-05-08 NOTE — Telephone Encounter (Signed)
PT was called and told he has pneumonia. She needs a Dr.'s note and advise on how many days she needs to stay off of work. Please call her @ [phone]

## 2023-05-08 NOTE — Telephone Encounter (Signed)
disregard

## 2023-05-08 NOTE — Telephone Encounter (Signed)
Fine with me

## 2023-05-09 NOTE — Telephone Encounter (Signed)
LM on VM for patient to call to let us know how many days patient will need to be out of work per Dr. Sherene Sires.  Will need to make work note in [employer] and print for patient to pick up.

## 2023-05-14 ENCOUNTER — Telehealth: Payer: Self-pay | Admitting: Student

## 2023-05-14 NOTE — Telephone Encounter (Signed)
Please see last signed encounters. PT called saying she does NOT need the Dr's note. NFN.

## 2023-05-14 NOTE — Telephone Encounter (Signed)
Left message x 2 for patient to call clinic regarding work note.  Will send MyChart note and close encounter.

## 2023-05-15 ENCOUNTER — Telehealth: Payer: Self-pay | Admitting: Student

## 2023-05-15 NOTE — Telephone Encounter (Signed)
Pt called in bc she has pneumonia and she is still feeling just as bad before taking any meds

## 2023-05-16 NOTE — Telephone Encounter (Signed)
I spoke to pt and she wanted to inform us of how she was feeling as of a week of being dx with pneumonia. Pt doesn't have any sob, wheezing or cough, she is just still tired and still not feeling like herself.  She is taking her medicine as prescribed. And has a f/up appt with Dr Thora Lance on 05/23/23. I informed pt that pneumonia takes time to recover from. Pt verbalized understanding. Nothing further needed.

## 2023-05-20 NOTE — Progress Notes (Signed)
Synopsis: Referred for abnormal x ray by Farris Has, MD  Subjective:   PATIENT ID: Ashley Key GENDER: female DOB: 1966/11/20, MRN: [id number]  Chief Complaint  Patient presents with   Follow-up    CXR repeated.  Cough is much improved. Her breathing is doing well    57yF with history of HTN, hypothyroid, MS, ITP referred for scar on XR ribs  Had covid about 6 months ago - wasn't hospitalized-  and ever since then she has had some right sided CP. Worse with deep breath and cough.   Tried going back for aerobics about a month ago and she still feels winded with less than usual intensity aerobic exercise - it is improving. Doesn't have much of a cough currently.   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 Wasn [place] most of her adult life. No international travel before this. Has 3 cats. She never smoked, no MJ/vaping.   Interval HPI  Came into clinic again with increased cough, dyspnea 5/30 saw MW and had multifocal alveolar/interstitial opacities on CXR that day - concern for CAP vs OP, started on levaquin, steroid course, ppi, h2b  She took all of this, stopped ppi and h2b when her course of steroids was completed.   Still has some DOE with moderate exertion though cough essentially resolved.   Otherwise pertinent review of systems is negative.  Past Medical History:  Diagnosis Date   ADHD (attention deficit hyperactivity disorder)    Arch pain    Arthritis    FEET   Ataxic gait 10/15/2017   Attention deficit 10/15/2017   Blood dyscrasia    NORMAL PLATLETS 50-70'S   Bunion    Chest pain, unspecified 01/18/2016   Cognitive deficit secondary to multiple sclerosis (HCC) 01/13/2018   Diplopia 08/10/2019   Essential hypertension 01/18/2016   Graves disease    Headache    HISTORY MIGRAINE   Hypertension    Hypothyroidism 01/18/2016    Idiopathic thrombocytopenic purpura (HCC) 06/27/2014   ITP (idiopathic thrombocytopenic purpura)    Memory loss 10/15/2017   Multiple sclerosis (HCC) 10/23/2017   Numbness 10/15/2017   Pelvic fracture (HCC)    Pneumonia    Screening, lipid 01/18/2016   Thrombocytopenia (HCC) 06/27/2014   White matter abnormality on MRI of brain 10/15/2017     Family History  Problem Relation Age of Onset   CAD Mother    Heart attack Mother    Stroke Father    Heart attack Father    CAD Brother    Heart attack Brother    Cancer Brother        prostate cancer   Heart attack Maternal Grandmother    Stroke Maternal Grandmother    Stroke Paternal Grandmother      Past Surgical History:  Procedure Laterality Date   DILITATION & CURRETTAGE/HYSTROSCOPY WITH HYDROTHERMAL ABLATION N/A 09/27/2015   Procedure: DILATATION & CURETTAGE/HYSTEROSCOPY WITH HYDROTHERMAL ABLATION;  Surgeon: Geryl Rankins, MD;  Location: [facility];  Service: Gynecology;  Laterality: N/A;  ultrasound guidance    FOOT SURGERY     THYROID RADATION 2016     WISDOM TOOTH EXTRACTION      Social History   Socioeconomic History   Marital status: Married    Spouse name: Theron Arista   Number of children: 1   Years of education: Not on file   Highest  education level: Not on file  Occupational History   Occupation: Engineer, maintenance (IT): [employer]  Tobacco Use   Smoking status: Never   Smokeless tobacco: Never  Substance and Sexual Activity   Alcohol use: Yes    Comment: social   Drug use: No   Sexual activity: Yes  Other Topics Concern   Not on file  Social History Narrative   Married, husband Theron Arista   Pharmacist, community in [place]   Children- #1 son (10)   Pet cat and getting new dog today            Epworth Sleepiness Scale = 9 (as of 01/22/2016)   Social Determinants of Health   Financial Resource Strain: Not on file  Food Insecurity: Not on file  Transportation Needs: Not on file  Physical Activity:  Not on file  Stress: Not on file  Social Connections: Not on file  Intimate Partner Violence: Not on file     No Known Allergies   Outpatient Medications Prior to Visit  Medication Sig Dispense Refill   albuterol (VENTOLIN HFA) 108 (90 Base) MCG/ACT inhaler Inhale 1-2 puffs into the lungs every 6 (six) hours as needed for wheezing or shortness of breath. 8 g 6   amLODipine (NORVASC) 5 MG tablet Take 5 mg by mouth daily.   2   buPROPion (WELLBUTRIN XL) 150 MG 24 hr tablet Take 150 mg by mouth every morning.     CALCIUM-VITAMIN D PO Take 1 tablet by mouth daily.     Multiple Vitamins-Minerals (MULTIVITAMIN PO) Take 1 tablet by mouth daily.     ocrelizumab 600 mg in sodium chloride 0.9 % 500 mL Inject 600 mg into the vein every 6 (six) months.     rosuvastatin (CRESTOR) 5 MG tablet TAKE 1 TABLET(5 MG) BY MOUTH DAILY 30 tablet 0   SYNTHROID 112 MCG tablet Take 112 mcg by mouth. Alternates daily between 110 and  5   TURMERIC PO Take 1 tablet by mouth daily.     famotidine (PEPCID) 20 MG tablet One after supper 30 tablet 11   modafinil (PROVIGIL) 200 MG tablet Take 1 tablet (200 mg total) by mouth daily. 30 tablet 5   pantoprazole (PROTONIX) 40 MG tablet Take 1 tablet (40 mg total) by mouth daily. Take 30-60 min before first meal of the day 30 tablet 2   predniSONE (DELTASONE) 10 MG tablet Take  4 each am x 2 days,   2 each am x 2 days,  1 each am x 2 days and stop 14 tablet 0   PREMARIN vaginal cream Place 1 application  vaginally 2 (two) times a week.     Facility-Administered Medications Prior to Visit  Medication Dose Route Frequency Provider Last Rate Last Admin   gadopentetate dimeglumine (MAGNEVIST) injection 15 mL  15 mL Intravenous Once PRN [name], Pearletha Furl, MD           Objective:   Physical Exam:  General appearance: 57 y.o., female, NAD, conversant  Eyes: anicteric sclerae; PERRL, tracking appropriately HENT: NCAT; MMM Neck: Trachea midline; no lymphadenopathy, no  JVD Lungs: CTAB, no crackles, no wheeze, with normal respiratory effort CV: RRR, no murmur  Abdomen: Soft, non-tender; non-distended, BS present  Extremities: No peripheral edema, warm Skin: Normal turgor and texture; no rash Psych: Appropriate affect Neuro: Alert and oriented to person and place, no focal deficit     Vitals:   05/23/23 1118  BP: 112/72  Pulse: 71  Temp: 97.8 F (36.6 C)  TempSrc: Oral  SpO2: 98%  Weight: 153 lb 6.4 oz (69.6 kg)     98% on RA BMI Readings from Last 3 Encounters:  05/23/23 24.76 kg/m  05/07/23 24.50 kg/m  04/30/23 24.82 kg/m   Wt Readings from Last 3 Encounters:  05/23/23 153 lb 6.4 oz (69.6 kg)  05/07/23 151 lb 12.8 oz (68.9 kg)  04/30/23 153 lb 12.8 oz (69.8 kg)     CBC    Component Value Date/Time   WBC 5.6 03/11/2023 1022   WBC 3.4 (L) 04/14/2020 1827   RBC 4.60 03/11/2023 1022   RBC 4.78 04/14/2020 1827   HGB 12.9 03/11/2023 1022   HGB 13.4 06/30/2017 1507   HCT 40.3 03/11/2023 1022   HCT 40.1 06/30/2017 1507   PLT 394 03/11/2023 1022   MCV 88 03/11/2023 1022   MCV 89.7 06/30/2017 1507   MCH 28.0 03/11/2023 1022   MCH 29.7 04/14/2020 1827   MCHC 32.0 03/11/2023 1022   MCHC 32.9 04/14/2020 1827   RDW 12.3 03/11/2023 1022   RDW 12.6 06/30/2017 1507   LYMPHSABS 0.9 03/11/2023 1022   LYMPHSABS 1.2 06/30/2017 1507   MONOABS 0.4 12/29/2017 0925   MONOABS 0.3 06/30/2017 1507   EOSABS 0.3 03/11/2023 1022   BASOSABS 0.1 03/11/2023 1022   BASOSABS 0.0 06/30/2017 1507      Chest Imaging: CXR ribs with plate-like atelectasis that probably corresponds to area seen on CTA Chest 09/25/22  CXR 5/29 with multifocal alveolar opacities  CXR 6/14 decreased burden of multifocal alveolar opacities  CT Cardiac scoring 02/20/16 calcium score 0  Pulmonary Functions Testing Results:     No data to display            Echocardiogram:   ECG treadmill 2017: The patient exercised following the Bruce protocol.  The  patient reported fatigue during the stress test. The patient experienced no angina during the stress test.   The test was stopped because the patient complained of fatigue and shortness of breath.   Blood pressure and heart rate demonstrated a normal response to exercise. Overall, the patient's exercise capacity was normal.   85% of maximum heart rate was achieved after 9 minutes. Recovery time: 7 minutes.  Duke Treadmill Score: low risk The patient's response to exercise was adequate for diagnosis.      Assessment & Plan:   # Chronic cough # Resolved CAP vs OP  # DOE More recent acute cough with multifocal alveolar opacities may have been either viral or bacterial CAP or OP. Chronic component of cough may reflect post infectious reactive airways and/or LPR - this is improved relative to earlier visits.   # Plate-like atelectasis Scar related to covid infection that corresponds to area seen on CTA Chest 09/2022  # Pleuritic R CP Decent chance she may have had nondisplaced rib fx related to severity/persistence of cough, now improved  Plan: - if cough, trouble breathing come back then call us and we'll check CT Chest and make clinic visit soon afterward to review results - if she has findings suggestive of CAP or OP would recommend bronchoscopy/BAL for cell count/diff and infectious workup. If felt to have OP would check CTD serologies. If felt to have OP but not caused by autoimmune process or if felt to have recurrent pneumonia then may warrant consideration of alternative anti-MS medication as there are case reports of ocrelizumab associated OP and there is increased risk as well for  lower respiratory tract infection.  RTC 07/07/23 with PFT   Omar Person, MD [facility] 05/23/2023 12:01 PM

## 2023-05-23 ENCOUNTER — Telehealth: Payer: Self-pay | Admitting: Student

## 2023-05-23 ENCOUNTER — Ambulatory Visit (INDEPENDENT_AMBULATORY_CARE_PROVIDER_SITE_OTHER): Payer: BC Managed Care – PPO | Admitting: Student

## 2023-05-23 ENCOUNTER — Encounter: Payer: Self-pay | Admitting: Student

## 2023-05-23 ENCOUNTER — Ambulatory Visit (INDEPENDENT_AMBULATORY_CARE_PROVIDER_SITE_OTHER): Payer: BC Managed Care – PPO

## 2023-05-23 VITALS — BP 112/72 | HR 71 | Temp 97.8°F | Wt 153.4 lb

## 2023-05-23 DIAGNOSIS — J189 Pneumonia, unspecified organism: Secondary | ICD-10-CM

## 2023-05-23 NOTE — Patient Instructions (Addendum)
-   You may have had a pneumonia (whether viral or bacterial infection) and ocrelizumab can increase your risk for lung infection. Alternatively you may have had organizing pneumonia (inflammatory condition that can just happen or can be related to viral/bacterial infection, drugs like ocrelizumab though rare, autoimmune conditions) - if cough, trouble breathing come back then call us and we'll check CT Chest and make clinic visit soon afterward to review results - PFTs and clinic visit in 3 months

## 2023-05-28 ENCOUNTER — Telehealth: Payer: Self-pay | Admitting: Neurology

## 2023-05-28 NOTE — Telephone Encounter (Signed)
Pt request changing pharmacy to [facility] for all medications prescribed by [facility] and pt confirmed.  [phone] Fax: [phone]

## 2023-05-28 NOTE — Telephone Encounter (Signed)
Pt last seen 03/11/23 and next f/u 09/18/23.   I do not see any meds on med list that we prescribe that would need [facility].

## 2023-06-05 ENCOUNTER — Telehealth: Payer: Self-pay | Admitting: Neurology

## 2023-06-05 NOTE — Telephone Encounter (Signed)
Called and LVM returning pt call. 

## 2023-06-05 NOTE — Telephone Encounter (Signed)
Took call from Ashley Key/phone room and spoke with pt. She had pneumonia about 3-4 wk ago. Went to respiratory specialist for further evaluation. Waiting to complete further testing. However, she was told the Pneumonia she had was not acquired from another person. One of the hypothesis the respiratory doctor suggested was that she could have developed this from being on her MS medication: Ocrevus. Last infusion date: 03/19/2023. Next infusion date: 09/17/2023. She denies being around anyone that was sick prior to developing pneumonia. Did go to graduation but distanced from others.   I did bring up that Ocrevus can lower immune system and make her more at risk for infection. She would like Dr. Epimenio Foot to review and see if he feels this is cause of recent infection and what he recommends.

## 2023-06-05 NOTE — Telephone Encounter (Signed)
Pt called wanting to discuss with MD a possible issue with her gadopentetate dimeglumine (MAGNEVIST) injection 15 mL and the side affects it is causing. Please advise.

## 2023-06-09 ENCOUNTER — Encounter: Payer: Self-pay | Admitting: Neurology

## 2023-06-18 NOTE — Telephone Encounter (Signed)
Pt has called back stating she has not heard back re: what Dr Epimenio Foot has to say about information she provided Kara Mead, RN on 6-27, please call.

## 2023-06-18 NOTE — Telephone Encounter (Signed)
Pt called back and got her scheduled with Dr. Epimenio Foot for 07/03/2023 @ 1:30pm.

## 2023-06-18 NOTE — Telephone Encounter (Signed)
LVM for pt to call back before 5pm today to schedule her to see Dr. Epimenio Foot 07/03/2023 @ 1:30pm

## 2023-07-03 ENCOUNTER — Ambulatory Visit (INDEPENDENT_AMBULATORY_CARE_PROVIDER_SITE_OTHER): Payer: BC Managed Care – PPO | Admitting: Neurology

## 2023-07-03 ENCOUNTER — Encounter: Payer: Self-pay | Admitting: Neurology

## 2023-07-03 VITALS — BP 106/73 | HR 80 | Ht 65.0 in | Wt 155.5 lb

## 2023-07-03 DIAGNOSIS — F09 Unspecified mental disorder due to known physiological condition: Secondary | ICD-10-CM

## 2023-07-03 DIAGNOSIS — R260 Ataxic gait: Secondary | ICD-10-CM | POA: Diagnosis not present

## 2023-07-03 DIAGNOSIS — Z79899 Other long term (current) drug therapy: Secondary | ICD-10-CM

## 2023-07-03 DIAGNOSIS — G35 Multiple sclerosis: Secondary | ICD-10-CM

## 2023-07-03 DIAGNOSIS — H04129 Dry eye syndrome of unspecified lacrimal gland: Secondary | ICD-10-CM

## 2023-07-03 DIAGNOSIS — F32A Depression, unspecified: Secondary | ICD-10-CM

## 2023-07-03 NOTE — Progress Notes (Signed)
[facility]  PATIENT: Ashley Key DOB: [birth year]  REFERRING DOCTOR OR PCP:  Farris Has (PCP); Amy Elisabeth Most (ADD) SOURCE: patient, notes from Dr. Kateri Plummer, imaging/lab reports, MRI images on PACS  _________________________________   HISTORICAL  CHIEF COMPLAINT:  Chief Complaint  Patient presents with   Follow-up    Pt in room 11. Here for MS follow up, on  Ocrevus. Pt reports she had pneumonia about 5 weeks ago. Pt reports having a deep cough, has to clear her throat several times per day. Pt said memory is getting worse.  Pt reports dry eye, using OTC eye drops twice per day which helps.     HISTORY OF PRESENT ILLNESS:  Ashley Key is a 57 y.o. woman with relapsing remitting MS diagnosed 10/2017.  Her main issues involve cognition with reduced ability to learn and focus  Update 07/03/2023 She is on Ocrevus.   She had pneumonia in May 2025.  CXR showed atelectasis.    Pneumonia was resolved 05/23/2023  She was treated with Levaquin, steroid and GI prophylaxis.  Her last   Her MS has been stable.   She has not had any exacerbation   Gait and strength are ok.   She notes numbness in her right foot.  It is less than last year.   Gait is mildly off balanced at times an she uses the bannister going downstairs.  Bladder is doing ok.     She has fatigue and reduced focus/attention.   She has neurocognitive deficits noted on neurocognitive testing.  She notes memory issues. She stopped the Adderall.   She has hypothyroid and is on Synthroid.   Due to fatigue, she can only walk 1/2 mile instead of 1-2 miles as she did last year.   She feels winded easily.  She notes her fatigue worsened after COVID in January 2024.  Memory is doing worse.  She needs to rely on lists more.    Last year, she scored 24/30 on the MoCA losing 5 points for STM - 0/5 with no hints but 4/5 with multiple choice.   She scored 25/30 in 2018 losing all points for STM at that time  She has trouble  with following recipes or other complex tasks.  Her son was arrested and spent time in jail now on house arrest at night/probation  She has depression and is on Wellbutrin.   She feels mood is doing ok now.     After starting Ocrevus, her thrombocytopenia (had ITP))  resolved.       06/17/2022   10:03 AM 10/15/2017    9:52 AM  Montreal Cognitive Assessment   Visuospatial/ Executive (0/5) 5 5  Naming (0/3) 3 3  Attention: Read list of digits (0/2) 2 2  Attention: Read list of letters (0/1) 1 1  Attention: Serial 7 subtraction starting at 100 (0/3) 3 3  Language: Repeat phrase (0/2) 1 2  Language : Fluency (0/1) 1 1  Abstraction (0/2) 2 2  Delayed Recall (0/5) 0 0  Orientation (0/6) 6 6  Total 24 25  Adjusted Score (based on education)  25     MS History: She noted  difficulties with her memory, focus and attention in 2016/2017.   She noted more difficulties with her work.   MRI showed changes c/w MS.  Physically she had mild gait disturbance    Sh started Ocrevus in 2018 and she has tolerated it well.   She had ITP 06/2014 with a platelet  count of 12.  She was treated with steroids and improved (Dr. Truett Perna) Anette Riedel there was persistent thrombocytopenia (2018  plt count = 79) and it resolved with OCrevus.    IMAGING REVIEW:  MRI of the cervical spine 10/21/2017 showed a T2 hyperintense focus at the cervicomedullary junction and mild degenerative changes at C3-C4 and C4-C5.  Normal enhancement pattern.  MRI of the brain 10/10/2017 showed multiple T2/FLAIR hyperintense foci in the juxtacortical, periventricular and deep white matter. Additionally there is a focus in the left thalamus, right midbrain, right pons and a subtle focus in the right cerebellar hemisphere and possibly at the cervicomedullary junction.  One of the juxtacortical foci on the right is mildly hyperintense on diffusion-weighted images and may be more acute or be artifact.   MRI of the brain 08/18/2019 and MRI  02/04/2019 showed no new lesions.   MRI of the brain 05/30/2021 showed T2 hyperintense foci within the brainstem, thalamus and hemispheres in a pattern and configuration consistent with chronic demyelinating plaque associated with multiple sclerosis.  None of the foci appears to be acute.  They do not enhance.  Compared to the MRI dated 08/18/2019, there are no new lesions.  MRI of the brain 07/02/2022 was unchanged.   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 Based on my clinical interview with the patient and her history, I concur with the diagnostic impressions described in her previous neuropsychological evaluation by Dr. Orie Fisherman (dated 05/19/2018). Specifically, it appears that the patient is experiencing mild neurocognitive disorder due to MS and preexisting ADHD.  Psychological testing did not reveal any indication of additional psychiatric component.  Ashley Key is a 57 year old, right-handed, Caucasian female, who reported experiencing cognitive deficits that began approximately 2 years ago. This is in the context of recently diagnosed relapsing-remitting multiple sclerosis.  Test results revealed intact functioning across most cognitive domains and thinking skills assessed during this evaluation with the exception of reduced aspects of verbal learning and memory as well as mildly slowed processing speed. From an emotional standpoint, there appears to be at least a moderate degree of recent depression and mild-to-moderate anxiety.  Ashley Key's performance is consistent with a diagnosis of Mild Neurocognitive Disorder (i.e., mild cognitive impairment). With regard to etiology, these results are likely due to the cognitive consequences of multiple sclerosis and mood disruption.     REVIEW OF SYSTEMS: Constitutional: No fevers, chills, sweats, or change in appetite.   She has fatigue.   Mild insomnia.    Eyes: No  visual changes, double vision, eye pain Ear, nose and throat: No hearing loss, ear pain, nasal congestion, sore throat Cardiovascular: No chest pain, palpitations Respiratory:  No shortness of breath at rest or with exertion.   No wheezes GastrointestinaI: No nausea, vomiting, diarrhea, abdominal pain, fecal incontinence Genitourinary:  Urinary urgency and nocturia. Musculoskeletal:  No neck pain, back pain Integumentary: No rash, pruritus, skin lesions Neurological: as above Psychiatric: mild depression Endocrine: No palpitations, diaphoresis, change in appetite, change in weigh or increased thirst Hematologic/Lymphatic:  No anemia, purpura, petechiae. Allergic/Immunologic: No itchy/runny eyes, nasal congestion, recent allergic reactions, rashes  ALLERGIES: No Known Allergies  HOME MEDICATIONS:  Current Outpatient Medications:    albuterol (VENTOLIN HFA) 108 (90 Base) MCG/ACT inhaler, Inhale 1-2 puffs into the lungs every 6 (six) hours as needed for wheezing or shortness of breath., Disp: 8 g, Rfl: 6   amLODipine (NORVASC) 5 MG tablet, Take 5 mg by  mouth daily. , Disp: , Rfl: 2   buPROPion (WELLBUTRIN XL) 150 MG 24 hr tablet, Take 150 mg by mouth every morning., Disp: , Rfl:    CALCIUM-VITAMIN D PO, Take 1 tablet by mouth daily., Disp: , Rfl:    modafinil (PROVIGIL) 200 MG tablet, Take 200 mg by mouth daily., Disp: , Rfl:    Multiple Vitamins-Minerals (MULTIVITAMIN PO), Take 1 tablet by mouth daily., Disp: , Rfl:    ocrelizumab 600 mg in sodium chloride 0.9 % 500 mL, Inject 600 mg into the vein every 6 (six) months., Disp: , Rfl:    rosuvastatin (CRESTOR) 5 MG tablet, TAKE 1 TABLET(5 MG) BY MOUTH DAILY, Disp: 30 tablet, Rfl: 0   SYNTHROID 112 MCG tablet, Take 112 mcg by mouth. Alternates daily between 110 and , Disp: , Rfl: 5   TURMERIC PO, Take 1 tablet by mouth daily., Disp: , Rfl:  No current facility-administered medications for this visit.  Facility-Administered  Medications Ordered in Other Visits:    gadopentetate dimeglumine (MAGNEVIST) injection 15 mL, 15 mL, Intravenous, Once PRN, Aliegha Paullin, Pearletha Furl, MD  PAST MEDICAL HISTORY: Past Medical History:  Diagnosis Date   ADHD (attention deficit hyperactivity disorder)    Arch pain    Arthritis    FEET   Ataxic gait 10/15/2017   Attention deficit 10/15/2017   Blood dyscrasia    NORMAL PLATLETS 50-70'S   Bunion    Chest pain, unspecified 01/18/2016   Cognitive deficit secondary to multiple sclerosis (HCC) 01/13/2018   Diplopia 08/10/2019   Essential hypertension 01/18/2016   Graves disease    Headache    HISTORY MIGRAINE   Hypertension    Hypothyroidism 01/18/2016   Idiopathic thrombocytopenic purpura (HCC) 06/27/2014   ITP (idiopathic thrombocytopenic purpura)    Memory loss 10/15/2017   Multiple sclerosis (HCC) 10/23/2017   Numbness 10/15/2017   Pelvic fracture (HCC)    Pneumonia    Screening, lipid 01/18/2016   Thrombocytopenia (HCC) 06/27/2014   White matter abnormality on MRI of brain 10/15/2017    PAST SURGICAL HISTORY: Past Surgical History:  Procedure Laterality Date   DILITATION & CURRETTAGE/HYSTROSCOPY WITH HYDROTHERMAL ABLATION N/A 09/27/2015   Procedure: DILATATION & CURETTAGE/HYSTEROSCOPY WITH HYDROTHERMAL ABLATION;  Surgeon: Geryl Rankins, MD;  Location: [facility];  Service: Gynecology;  Laterality: N/A;  ultrasound guidance    FOOT SURGERY     THYROID RADATION 2016     WISDOM TOOTH EXTRACTION      FAMILY HISTORY: Family History  Problem Relation Age of Onset   CAD Mother    Heart attack Mother    Stroke Father    Heart attack Father    CAD Brother    Heart attack Brother    Cancer Brother        prostate cancer   Heart attack Maternal Grandmother    Stroke Maternal Grandmother    Stroke Paternal Grandmother     SOCIAL HISTORY:  Social History   Socioeconomic History   Marital status: Married    Spouse name: Theron Arista   Number of children: 1   Years of education: Not on  file   Highest education level: Not on file  Occupational History   Occupation: Engineer, maintenance (IT): [employer]  Tobacco Use   Smoking status: Never   Smokeless tobacco: Never  Substance and Sexual Activity   Alcohol use: Yes    Comment: social   Drug use: No   Sexual activity: Yes  Other Topics Concern  Not on file  Social History Narrative   Married, husband Theron Arista   Pharmacist, community in [place]   Children- #1 son (10)   Pet cat and getting new dog today            Epworth Sleepiness Scale = 9 (as of 01/22/2016)   Social Determinants of Health   Financial Resource Strain: Not on file  Food Insecurity: No Food Insecurity (08/16/2021)   Received from [facility]   Hunger Vital Sign    Worried About Running Out of Food in the Last Year: Never true    Ran Out of Food in the Last Year: Never true  Transportation Needs: Not on file  Physical Activity: Not on file  Stress: Not on file  Social Connections: Unknown (04/19/2022)   Received from [facility]   Social Network    Social Network: Not on file  Intimate Partner Violence: Unknown (03/11/2022)   Received from [facility]   HITS    Physically Hurt: Not on file    Insult or Talk Down To: Not on file    Threaten Physical Harm: Not on file    Scream or Curse: Not on file     PHYSICAL EXAM  Vitals:   07/03/23 1305  BP: 106/73  Pulse: 80  Weight: 155 lb 8 oz (70.5 kg)  Height: 5\' 5"  (1.651 m)    Body mass index is 25.88 kg/m.   General: The patient is well-developed and well-nourished and in no acute distress.   No erythema in joints.   No heat.    Has mild tenderness to deep palpation over CMC joint at base of thumbs.  Lungs were clear.  Heart had regular rate and rhythm normal S1-S2.  No murmurs  Neurologic Exam  Mental status: The patient is alert and oriented x 3 at the time of the examination.  She appears to have mildly reduced short-term memory and focus today.  (she writes  things down).  Speech is normal.  Cranial nerves: Extraocular movements are full. Facial strength and sensation is normal.   No obvious hearing deficits are noted.  Motor:  Muscle bulk is normal.   Muscle tone is normal.. Strength is  5 / 5 in all 4 extremities.   Sensory:   She has symetric sensation today to touch/vibration  Coordination: There is good finger-nose-finger and mildly reduced heel-to-shin, right worse than left.  Gait and station: Station is normal.   The gait is mildly wide and the tandem gait is wide..  Romberg is mildly positive.  Reflexes: Deep tendon reflexes are symmetric and 2 in arms and 3 in legs bilaterally with spread at the knees.  No ankle clonus.   DIAGNOSTIC DATA (LABS, IMAGING, TESTING) - I reviewed patient records, labs, notes, testing and imaging myself where available.  Lab Results  Component Value Date   WBC 5.6 03/11/2023   HGB 12.9 03/11/2023   HCT 40.3 03/11/2023   MCV 88 03/11/2023   PLT 394 03/11/2023      Component Value Date/Time   NA 139 09/25/2022 0550   K 3.8 09/25/2022 0550   CL 101 09/25/2022 0550   CO2 25 04/14/2020 1827   GLUCOSE 100 (H) 09/25/2022 0550   BUN 7 09/25/2022 0550   CREATININE 0.80 09/25/2022 0550   CALCIUM 8.7 (L) 04/14/2020 1827   PROT 6.8 04/14/2020 1827   PROT 6.7 08/10/2019 1409   ALBUMIN 3.8 04/14/2020 1827   ALBUMIN 4.7 08/10/2019 1409   AST  28 04/14/2020 1827   ALT 22 04/14/2020 1827   ALKPHOS 62 04/14/2020 1827   BILITOT 0.5 04/14/2020 1827   BILITOT <0.2 08/10/2019 1409   GFRNONAA >60 04/14/2020 1827   GFRAA >60 04/14/2020 1827        ASSESSMENT AND PLAN  Multiple sclerosis (HCC)  High risk medication use  Ataxic gait  Cognitive deficit secondary to multiple sclerosis (HCC)  Depression, unspecified depression type   1.   Continue Ocrevus for now.  If any other significant infection, consider change in therapy (we discussed [facility] and [facility].  If she switches she would be most  interested in [place]).  Will check labs next visit 2.   Continue modafinil for her fatigue.    3.   Stay active physically and mentally.  4.   We discussed that her cognitive issues are likely combination of the MS combined with mood and possibly poor sleep.  She is advised to try to get 7 hours of sleep nightly.   She had scored 25/30 on the MoCA.   5.   Vitamin D supplementation.daily 6    Return to see me in 6 months or sooner with any new or worsening neurologic symptoms.  40-minute office visit with the majority of the time spent face-to-face for history and physical, discussion/counseling and decision-making.  Additional time with record review and documentation.  This visit is part of a comprehensive longitudinal care medical relationship regarding the patients primary diagnosis of MS and related concerns.     Sachit Gilman A. Epimenio Foot, MD, PhD, FAAN Certified in Neurology, Clinical Neurophysiology, Sleep Medicine, Pain Medicine and Neuroimaging Director, [facility] at [facility]  [facility] [address], [address] [place], [place] [address] [phone]

## 2023-07-07 ENCOUNTER — Ambulatory Visit (INDEPENDENT_AMBULATORY_CARE_PROVIDER_SITE_OTHER): Payer: BC Managed Care – PPO | Admitting: Pulmonary Disease

## 2023-07-07 ENCOUNTER — Encounter: Payer: Self-pay | Admitting: Pulmonary Disease

## 2023-07-07 ENCOUNTER — Ambulatory Visit: Payer: BC Managed Care – PPO | Admitting: Pulmonary Disease

## 2023-07-07 VITALS — BP 118/80 | HR 73 | Ht 66.6 in | Wt 157.2 lb

## 2023-07-07 DIAGNOSIS — R053 Chronic cough: Secondary | ICD-10-CM | POA: Diagnosis not present

## 2023-07-07 DIAGNOSIS — J984 Other disorders of lung: Secondary | ICD-10-CM | POA: Diagnosis not present

## 2023-07-07 DIAGNOSIS — J189 Pneumonia, unspecified organism: Secondary | ICD-10-CM

## 2023-07-07 LAB — PULMONARY FUNCTION TEST
DL/VA % pred: 93 %
DL/VA: 3.87 ml/min/mmHg/L
DLCO cor % pred: 66 %
DLCO cor: 14.68 ml/min/mmHg
DLCO unc % pred: 66 %
DLCO unc: 14.68 ml/min/mmHg
FEF 25-75 Post: 2.08 L/sec
FEF 25-75 Pre: 2.26 L/sec
FEF2575-%Change-Post: -7 %
FEF2575-%Pred-Post: 80 %
FEF2575-%Pred-Pre: 87 %
FEV1-%Change-Post: 2 %
FEV1-%Pred-Post: 76 %
FEV1-%Pred-Pre: 74 %
FEV1-Post: 2.18 L
FEV1-Pre: 2.13 L
FEV1FVC-%Change-Post: 0 %
FEV1FVC-%Pred-Pre: 102 %
FEV6-%Change-Post: 5 %
FEV6-%Pred-Post: 76 %
FEV6-%Pred-Pre: 72 %
FEV6-Post: 2.71 L
FEV6-Pre: 2.56 L
FEV6FVC-%Change-Post: 0 %
FEV6FVC-%Pred-Post: 103 %
FEV6FVC-%Pred-Pre: 102 %
FVC-%Change-Post: 2 %
FVC-%Pred-Post: 73 %
FVC-%Pred-Pre: 71 %
FVC-Post: 2.71 L
FVC-Pre: 2.63 L
Post FEV1/FVC ratio: 81 %
Post FEV6/FVC ratio: 100 %
Pre FEV1/FVC ratio: 81 %
Pre FEV6/FVC Ratio: 99 %
RV % pred: 98 %
RV: 2.04 L
TLC % pred: 80 %
TLC: 4.35 L

## 2023-07-07 NOTE — Progress Notes (Signed)
Full PFT performed today. °

## 2023-07-07 NOTE — Patient Instructions (Signed)
Full PFT performed today. °

## 2023-07-07 NOTE — Patient Instructions (Addendum)
You breathing tests show mild restriction and mild diffusion defect (oxygen having a harder time moving through the lungs into the blood stream)  We will check a high resolution CT chest in 3 months  Follow up in 3-4 months after CT Chest

## 2023-07-07 NOTE — Progress Notes (Signed)
Synopsis: Referred in July 2024 for pneumonia follow up, former Dr. Thora Lance patient  Subjective:   PATIENT ID: Ashley Reining GENDER: female DOB: 1966-01-05, MRN: [id number]   HPI  Chief Complaint  Patient presents with   Establish Care    Former [facility] pt for PNA and cough. Completed PFT today. States her breathing has improved since last visit.    57yF with history of HTN, hypothyroid, MS, ITP referred for scar on XR ribs   Had covid about 6 months ago - wasn't hospitalized-  and ever since then she has had some right sided CP. Worse with deep breath and cough.    Tried going back for aerobics about a month ago and she still feels winded with less than usual intensity aerobic exercise - it is improving. Doesn't have much of a cough currently.    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 Wasn [place] most of her adult life. No international travel before this. Has 3 cats. She never smoked, no MJ/vaping.     OV 05/23/23  Came into clinic again with increased cough, dyspnea 5/30 saw MW and had multifocal alveolar/interstitial opacities on CXR that day - concern for CAP vs OP, started on levaquin, steroid course, ppi, h2b   She took all of this, stopped ppi and h2b when her course of steroids was completed.    Still has some DOE with moderate exertion though cough essentially resolved.    Otherwise pertinent review of systems is negative.  Today 07/07/23 She reports her breathing has improved since last visit.  No specific treatment given at that time.  X-ray at last visit also showed interval resolution of multifocal pneumonia.  PFTs today show mild restriction and mild diffusion defect.  Recent labs from her neurology team show elevated SSA antibody titer.  Past Medical History:  Diagnosis Date   ADHD (attention deficit hyperactivity disorder)     Arch pain    Arthritis    FEET   Ataxic gait 10/15/2017   Attention deficit 10/15/2017   Blood dyscrasia    NORMAL PLATLETS 50-70'S   Bunion    Chest pain, unspecified 01/18/2016   Cognitive deficit secondary to multiple sclerosis (HCC) 01/13/2018   Diplopia 08/10/2019   Essential hypertension 01/18/2016   Graves disease    Headache    HISTORY MIGRAINE   Hypertension    Hypothyroidism 01/18/2016   Idiopathic thrombocytopenic purpura (HCC) 06/27/2014   ITP (idiopathic thrombocytopenic purpura)    Memory loss 10/15/2017   Multiple sclerosis (HCC) 10/23/2017   Numbness 10/15/2017   Pelvic fracture (HCC)    Pneumonia    Screening, lipid 01/18/2016   Thrombocytopenia (HCC) 06/27/2014   White matter abnormality on MRI of brain 10/15/2017     Family History  Problem Relation Age of Onset   CAD Mother    Heart attack Mother    Stroke Father    Heart attack Father    CAD Brother    Heart attack Brother    Cancer Brother        prostate cancer   Heart attack Maternal Grandmother    Stroke Maternal Grandmother    Stroke Paternal Grandmother      Social History   Socioeconomic History   Marital status: Married    Spouse name: Theron Arista   Number of children: 1   Years  of education: Not on file   Highest education level: Not on file  Occupational History   Occupation: Wellsite geologist    Employer: [employer]  Tobacco Use   Smoking status: Never   Smokeless tobacco: Never  Substance and Sexual Activity   Alcohol use: Yes    Comment: social   Drug use: No   Sexual activity: Yes  Other Topics Concern   Not on file  Social History Narrative   Married, husband Market researcher in [place]   Children- #1 son (10)   Pet cat and getting new dog today            Epworth Sleepiness Scale = 9 (as of 01/22/2016)   Social Determinants of Health   Financial Resource Strain: Not on file  Food Insecurity: No Food Insecurity (08/16/2021)   Received from [facility]    Hunger Vital Sign    Worried About Running Out of Food in the Last Year: Never true    Ran Out of Food in the Last Year: Never true  Transportation Needs: Not on file  Physical Activity: Not on file  Stress: Not on file  Social Connections: Unknown (04/19/2022)   [facility]   Social Network    Social Network: Not on file  Intimate Partner Violence: Unknown (03/11/2022)   Received from [facility]   HITS    Physically Hurt: Not on file    Insult or Talk Down To: Not on file    Threaten Physical Harm: Not on file    Scream or Curse: Not on file     No Known Allergies   Outpatient Medications Prior to Visit  Medication Sig Dispense Refill   albuterol (VENTOLIN HFA) 108 (90 Base) MCG/ACT inhaler Inhale 1-2 puffs into the lungs every 6 (six) hours as needed for wheezing or shortness of breath. 8 Key 6   amLODipine (NORVASC) 5 MG tablet Take 5 mg by mouth daily.   2   buPROPion (WELLBUTRIN XL) 150 MG 24 hr tablet Take 150 mg by mouth every morning.     CALCIUM-VITAMIN D PO Take 1 tablet by mouth daily.     modafinil (PROVIGIL) 200 MG tablet Take 200 mg by mouth daily.     Multiple Vitamins-Minerals (MULTIVITAMIN PO) Take 1 tablet by mouth daily.     rosuvastatin (CRESTOR) 5 MG tablet TAKE 1 TABLET(5 MG) BY MOUTH DAILY 30 tablet 0   SYNTHROID 112 MCG tablet Take 112 mcg by mouth. Alternates daily between 110 and  5   TURMERIC PO Take 1 tablet by mouth daily.     ocrelizumab 600 mg in sodium chloride 0.9 % 500 mL Inject 600 mg into the vein every 6 (six) months.     Facility-Administered Medications Prior to Visit  Medication Dose Route Frequency Provider Last Rate Last Admin   gadopentetate dimeglumine (MAGNEVIST) injection 15 mL  15 mL Intravenous Once PRN [name], Pearletha Furl, MD        Review of Systems  Constitutional:  Negative for chills, fever, malaise/fatigue and weight loss.  HENT:  Negative for congestion, sinus pain and sore throat.   Eyes: Negative.    Respiratory:  Negative for cough, hemoptysis, sputum production, shortness of breath and wheezing.   Cardiovascular:  Negative for chest pain, palpitations, orthopnea, claudication and leg swelling.  Gastrointestinal:  Negative for abdominal pain, heartburn, nausea and vomiting.  Genitourinary: Negative.   Musculoskeletal:  Negative for joint pain and  myalgias.  Skin:  Negative for rash.  Neurological:  Negative for weakness.  Endo/Heme/Allergies: Negative.   Psychiatric/Behavioral: Negative.     Objective:   Vitals:   07/07/23 1316  BP: 118/80  Pulse: 73  SpO2: 100%  Weight: 157 lb 3.2 oz (71.3 kg)  Height: 5' 6.6" (1.692 m)   Physical Exam Constitutional:      General: She is not in acute distress.    Appearance: Normal appearance.  Eyes:     General: No scleral icterus.    Conjunctiva/sclera: Conjunctivae normal.  Cardiovascular:     Rate and Rhythm: Normal rate and regular rhythm.  Pulmonary:     Breath sounds: No wheezing, rhonchi or rales.  Musculoskeletal:     Right lower leg: No edema.     Left lower leg: No edema.  Skin:    General: Skin is warm and dry.  Neurological:     General: No focal deficit present.    CBC    Component Value Date/Time   WBC 5.6 03/11/2023 1022   WBC 3.4 (L) 04/14/2020 1827   RBC 4.60 03/11/2023 1022   RBC 4.78 04/14/2020 1827   HGB 12.9 03/11/2023 1022   HGB 13.4 06/30/2017 1507   HCT 40.3 03/11/2023 1022   HCT 40.1 06/30/2017 1507   PLT 394 03/11/2023 1022   MCV 88 03/11/2023 1022   MCV 89.7 06/30/2017 1507   MCH 28.0 03/11/2023 1022   MCH 29.7 04/14/2020 1827   MCHC 32.0 03/11/2023 1022   MCHC 32.9 04/14/2020 1827   RDW 12.3 03/11/2023 1022   RDW 12.6 06/30/2017 1507   LYMPHSABS 0.9 03/11/2023 1022   LYMPHSABS 1.2 06/30/2017 1507   MONOABS 0.4 12/29/2017 0925   MONOABS 0.3 06/30/2017 1507   EOSABS 0.3 03/11/2023 1022   BASOSABS 0.1 03/11/2023 1022   BASOSABS 0.0 06/30/2017 1507      Latest Ref Rng & Units  07/03/2023    2:27 PM 09/25/2022    5:50 AM 04/14/2020    6:27 PM  BMP  Glucose 70 - 99 mg/dL 90  161  096   BUN 6 - 24 mg/dL 19  7  12    Creatinine 0.57 - 1.00 mg/dL 0.45  4.09  8.11   BUN/Creat Ratio 9 - 23 22     Sodium 134 - 144 mmol/L 141  139  137   Potassium 3.5 - 5.2 mmol/L 4.3  3.8  4.2   Chloride 96 - 106 mmol/L 101  101  101   CO2 20 - 29 mmol/L 24   25   Calcium 8.7 - 10.2 mg/dL 9.5   8.7    Chest imaging: CXR 05/23/23 Frontal and lateral views of the chest demonstrate an unremarkable cardiac silhouette. Interval resolution of the multifocal airspace disease seen on prior exam. No acute airspace disease, effusion, or pneumothorax. No acute bony abnormalities.   PFT:    Latest Ref Rng & Units 07/07/2023   10:43 AM  PFT Results  FVC-Pre L 2.63  P  FVC-Predicted Pre % 71  P  FVC-Post L 2.71  P  FVC-Predicted Post % 73  P  Pre FEV1/FVC % % 81  P  Post FEV1/FCV % % 81  P  FEV1-Pre L 2.13  P  FEV1-Predicted Pre % 74  P  FEV1-Post L 2.18  P  DLCO uncorrected ml/min/mmHg 14.68  P  DLCO UNC% % 66  P  DLCO corrected ml/min/mmHg 14.68  P  DLCO COR %Predicted % 66  P  DLVA Predicted % 93  P  TLC L 4.35  P  TLC % Predicted % 80  P  RV % Predicted % 98  P    P Preliminary result    Labs:  Path:  Echo:  Heart Catheterization:       Assessment & Plan:   Community acquired pneumonia of right lung, unspecified part of lung - Plan: CT CHEST HIGH RESOLUTION  Restrictive airway disease  Discussion: Ashley Key is a 57 year old woman, never smoker with history of hypertension, hypothyroidism, idiopathic thrombocytopenic purpura and multiple sclerosis who returns to pulmonary clinic for chronic cough, dyspnea on exertion and resolved community-acquired pneumonia versus organizing pneumonia.  Her breathing has been doing well since last visit.  Pulmonary function test show mild restriction and mild diffusion defect today.  We will check high-resolution CT chest  scan in 3 months for further evaluation of her abnormal pulmonary function tests.  She does have elevated SSA antibody levels based on recent testing by her neurology team.  We will continue to monitor for possible interstitial lung disease.  Follow-up in 3 months after CT chest scan.  Melody Comas, MD  Pulmonary & Critical Care Office: [phone]   Current Outpatient Medications:    albuterol (VENTOLIN HFA) 108 (90 Base) MCG/ACT inhaler, Inhale 1-2 puffs into the lungs every 6 (six) hours as needed for wheezing or shortness of breath., Disp: 8 Key, Rfl: 6   amLODipine (NORVASC) 5 MG tablet, Take 5 mg by mouth daily. , Disp: , Rfl: 2   buPROPion (WELLBUTRIN XL) 150 MG 24 hr tablet, Take 150 mg by mouth every morning., Disp: , Rfl:    CALCIUM-VITAMIN D PO, Take 1 tablet by mouth daily., Disp: , Rfl:    modafinil (PROVIGIL) 200 MG tablet, Take 200 mg by mouth daily., Disp: , Rfl:    Multiple Vitamins-Minerals (MULTIVITAMIN PO), Take 1 tablet by mouth daily., Disp: , Rfl:    rosuvastatin (CRESTOR) 5 MG tablet, TAKE 1 TABLET(5 MG) BY MOUTH DAILY, Disp: 30 tablet, Rfl: 0   SYNTHROID 112 MCG tablet, Take 112 mcg by mouth. Alternates daily between 110 and , Disp: , Rfl: 5   TURMERIC PO, Take 1 tablet by mouth daily., Disp: , Rfl:    Teriflunomide 14 MG TABS, One po qd, Disp: 90 tablet, Rfl: 3 No current facility-administered medications for this visit.  Facility-Administered Medications Ordered in Other Visits:    gadopentetate dimeglumine (MAGNEVIST) injection 15 mL, 15 mL, Intravenous, Once PRN, [name], Pearletha Furl, MD

## 2023-07-09 ENCOUNTER — Other Ambulatory Visit: Payer: Self-pay | Admitting: *Deleted

## 2023-07-09 ENCOUNTER — Other Ambulatory Visit: Payer: Self-pay | Admitting: Neurology

## 2023-07-09 DIAGNOSIS — G35 Multiple sclerosis: Secondary | ICD-10-CM

## 2023-07-09 DIAGNOSIS — M3500 Sicca syndrome, unspecified: Secondary | ICD-10-CM

## 2023-07-09 DIAGNOSIS — H04129 Dry eye syndrome of unspecified lacrimal gland: Secondary | ICD-10-CM

## 2023-07-09 MED ORDER — TERIFLUNOMIDE 14 MG PO TABS: ORAL_TABLET | ORAL | 3 refills | Status: DC

## 2023-07-13 ENCOUNTER — Encounter: Payer: Self-pay | Admitting: Pulmonary Disease

## 2023-07-15 ENCOUNTER — Telehealth: Payer: Self-pay | Admitting: Pulmonary Disease

## 2023-07-15 NOTE — Telephone Encounter (Signed)
Left message for patient to call back  

## 2023-07-17 ENCOUNTER — Ambulatory Visit: Payer: Medicare Other | Admitting: Neurology

## 2023-07-21 DIAGNOSIS — H2513 Age-related nuclear cataract, bilateral: Secondary | ICD-10-CM | POA: Diagnosis not present

## 2023-07-21 DIAGNOSIS — H5203 Hypermetropia, bilateral: Secondary | ICD-10-CM | POA: Diagnosis not present

## 2023-08-02 ENCOUNTER — Other Ambulatory Visit: Payer: Self-pay | Admitting: Internal Medicine

## 2023-08-02 DIAGNOSIS — R058 Other specified cough: Secondary | ICD-10-CM

## 2023-08-12 DIAGNOSIS — U071 COVID-19: Secondary | ICD-10-CM | POA: Diagnosis not present

## 2023-09-18 ENCOUNTER — Ambulatory Visit: Payer: Medicare Other | Admitting: Neurology

## 2023-09-19 ENCOUNTER — Ambulatory Visit: Payer: Medicare Other | Admitting: Neurology

## 2023-10-08 ENCOUNTER — Ambulatory Visit (HOSPITAL_COMMUNITY): Payer: Medicare Other

## 2023-10-15 ENCOUNTER — Ambulatory Visit (HOSPITAL_COMMUNITY)
Admission: RE | Admit: 2023-10-15 | Discharge: 2023-10-15 | Disposition: A | Discharge status: Home / Self Care | Payer: BC Managed Care – PPO | Source: Ambulatory Visit | Attending: Pulmonary Disease | Admitting: Pulmonary Disease

## 2023-10-15 ENCOUNTER — Telehealth: Payer: Self-pay | Admitting: Pulmonary Disease

## 2023-10-15 DIAGNOSIS — J189 Pneumonia, unspecified organism: Secondary | ICD-10-CM | POA: Insufficient documentation

## 2023-10-15 DIAGNOSIS — R918 Other nonspecific abnormal finding of lung field: Secondary | ICD-10-CM | POA: Diagnosis not present

## 2023-10-15 DIAGNOSIS — R590 Localized enlarged lymph nodes: Secondary | ICD-10-CM | POA: Diagnosis not present

## 2023-10-15 NOTE — Telephone Encounter (Signed)
Patient would like a call when her results from her CT Scan become available.

## 2023-10-17 NOTE — Telephone Encounter (Signed)
CT has not been read. Once available patient will be contacted.

## 2023-10-27 ENCOUNTER — Encounter: Payer: Self-pay | Admitting: Pulmonary Disease

## 2023-10-27 ENCOUNTER — Ambulatory Visit (INDEPENDENT_AMBULATORY_CARE_PROVIDER_SITE_OTHER): Payer: BC Managed Care – PPO | Admitting: Pulmonary Disease

## 2023-10-27 VITALS — BP 112/64 | HR 60 | Temp 97.9°F | Ht 66.0 in | Wt 156.0 lb

## 2023-10-27 DIAGNOSIS — R053 Chronic cough: Secondary | ICD-10-CM

## 2023-10-27 DIAGNOSIS — J984 Other disorders of lung: Secondary | ICD-10-CM

## 2023-10-27 NOTE — Progress Notes (Signed)
Synopsis: Referred in July 2024 for pneumonia follow up, former Dr. Thora Lance patient  Subjective:   PATIENT ID: Ashley Key GENDER: female DOB: 06/17/1966, MRN: [id number]   HPI  Chief Complaint  Patient presents with   Follow-up    Breathing has improved since the last visit. She has occ dry cough at bedtime. She would like a flu vaccine today.    57yF with history of HTN, hypothyroid, MS, ITP referred for scar on XR ribs   Had covid about 6 months ago - wasn't hospitalized-  and ever since then she has had some right sided CP. Worse with deep breath and cough.    Tried going back for aerobics about a month ago and she still feels winded with less than usual intensity aerobic exercise - it is improving. Doesn't have much of a cough currently.    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 Wasn [place] most of her adult life. No international travel before this. Has 3 cats. She never smoked, no MJ/vaping.     OV 05/23/23  Came into clinic again with increased cough, dyspnea 5/30 saw MW and had multifocal alveolar/interstitial opacities on CXR that day - concern for CAP vs OP, started on levaquin, steroid course, ppi, h2b   She took all of this, stopped ppi and h2b when her course of steroids was completed.    Still has some DOE with moderate exertion though cough essentially resolved.    Otherwise pertinent review of systems is negative.  OV 07/07/23 She reports her breathing has improved since last visit.  No specific treatment given at that time.  X-ray at last visit also showed interval resolution of multifocal pneumonia.  PFTs today show mild restriction and mild diffusion defect.  Recent labs from her neurology team show elevated SSA antibody titer.  Today OV 10/27/23 The patient, with a history of bronchiectasis, reports improved breathing  which he attributes to regular attendance at aerobics classes. She notes that her breathing is much better compared to his last visit when she was experiencing heavy breathing. She also mentions a recent upper respiratory infection, which has caused nasal congestion and red eyes. The patient uses a Ventolin inhaler as needed, typically at night if he starts coughing.  Past Medical History:  Diagnosis Date   ADHD (attention deficit hyperactivity disorder)    Arch pain    Arthritis    FEET   Ataxic gait 10/15/2017   Attention deficit 10/15/2017   Blood dyscrasia    NORMAL PLATLETS 50-70'S   Bunion    Chest pain, unspecified 01/18/2016   Cognitive deficit secondary to multiple sclerosis (HCC) 01/13/2018   Diplopia 08/10/2019   Essential hypertension 01/18/2016   Graves disease    Headache    HISTORY MIGRAINE   Hypertension    Hypothyroidism 01/18/2016   Idiopathic thrombocytopenic purpura (HCC) 06/27/2014   ITP (idiopathic thrombocytopenic purpura)    Memory loss 10/15/2017   Multiple sclerosis (HCC) 10/23/2017   Numbness 10/15/2017   Pelvic fracture (HCC)    Pneumonia    Screening, lipid 01/18/2016   Thrombocytopenia (HCC) 06/27/2014   White matter abnormality on MRI of brain 10/15/2017     Family History  Problem Relation Age of Onset   CAD Mother    Heart attack Mother    Stroke Father    Heart attack  Father    CAD Brother    Heart attack Brother    Cancer Brother        prostate cancer   Heart attack Maternal Grandmother    Stroke Maternal Grandmother    Stroke Paternal Grandmother      Social History   Socioeconomic History   Marital status: Married    Spouse name: Theron Arista   Number of children: 1   Years of education: Not on file   Highest education level: Not on file  Occupational History   Occupation: Engineer, maintenance (IT): [employer]  Tobacco Use   Smoking status: Never   Smokeless tobacco: Never  Substance and Sexual Activity   Alcohol use: Yes     Comment: social   Drug use: No   Sexual activity: Yes  Other Topics Concern   Not on file  Social History Narrative   Married, husband Theron Arista   Pharmacist, community in [place]   Children- #1 son (10)   Pet cat and getting new dog today            Epworth Sleepiness Scale = 9 (as of 01/22/2016)   Social Determinants of Health   Financial Resource Strain: Not on file  Food Insecurity: No Food Insecurity (08/16/2021)   Received from [facility], [facility]   Hunger Vital Sign    Worried About Running Out of Food in the Last Year: Never true    Ran Out of Food in the Last Year: Never true  Transportation Needs: Not on file  Physical Activity: Not on file  Stress: Not on file  Social Connections: Unknown (04/19/2022)   [facility], [facility]   Social Network    Social Network: Not on file  Intimate Partner Violence: Unknown (03/11/2022)   [facility] from [facility], [facility]   HITS    Physically Hurt: Not on file    Insult or Talk Down To: Not on file    Threaten Physical Harm: Not on file    Scream or Curse: Not on file     No Known Allergies   Outpatient Medications Prior to Visit  Medication Sig Dispense Refill   albuterol (VENTOLIN HFA) 108 (90 Base) MCG/ACT inhaler Inhale 1-2 puffs into the lungs every 6 (six) hours as needed for wheezing or shortness of breath. 8 g 6   amLODipine (NORVASC) 5 MG tablet Take 5 mg by mouth daily.   2   CALCIUM-VITAMIN D PO Take 1 tablet by mouth daily.     Multiple Vitamins-Minerals (MULTIVITAMIN PO) Take 1 tablet by mouth daily.     rosuvastatin (CRESTOR) 5 MG tablet TAKE 1 TABLET(5 MG) BY MOUTH DAILY 30 tablet 0   SYNTHROID 112 MCG tablet Take 112 mcg by mouth. Alternates daily between 110 and  5   TURMERIC PO Take 1 tablet by mouth daily.     buPROPion (WELLBUTRIN XL) 150 MG 24 hr tablet Take 150 mg by mouth every morning.     modafinil (PROVIGIL) 200 MG tablet Take 200 mg by mouth daily.      Teriflunomide 14 MG TABS One po qd 90 tablet 3   Facility-Administered Medications Prior to Visit  Medication Dose Route Frequency Provider Last Rate Last Admin   gadopentetate dimeglumine (MAGNEVIST) injection 15 mL  15 mL Intravenous Once PRN [name], Pearletha Furl, MD        Review of Systems  Constitutional:  Negative for chills, fever, malaise/fatigue and  weight loss.  HENT:  Negative for congestion, sinus pain and sore throat.   Eyes: Negative.   Respiratory:  Negative for cough, hemoptysis, sputum production, shortness of breath and wheezing.   Cardiovascular:  Negative for chest pain, palpitations, orthopnea, claudication and leg swelling.  Gastrointestinal:  Negative for abdominal pain, heartburn, nausea and vomiting.  Genitourinary: Negative.   Musculoskeletal:  Negative for joint pain and myalgias.  Skin:  Negative for rash.  Neurological:  Negative for weakness.  Endo/Heme/Allergies: Negative.   Psychiatric/Behavioral: Negative.     Objective:   Vitals:   10/27/23 1302  BP: 112/64  Pulse: 60  Temp: 97.9 F (36.6 C)  TempSrc: Oral  SpO2: 99%  Weight: 156 lb (70.8 kg)  Height: 5\' 6"  (1.676 m)   Physical Exam Constitutional:      General: She is not in acute distress.    Appearance: Normal appearance.  Eyes:     General: No scleral icterus.    Conjunctiva/sclera: Conjunctivae normal.  Cardiovascular:     Rate and Rhythm: Normal rate and regular rhythm.  Pulmonary:     Breath sounds: No wheezing, rhonchi or rales.  Musculoskeletal:     Right lower leg: No edema.     Left lower leg: No edema.  Skin:    General: Skin is warm and dry.  Neurological:     General: No focal deficit present.    CBC    Component Value Date/Time   WBC 5.6 03/11/2023 1022   WBC 3.4 (L) 04/14/2020 1827   RBC 4.60 03/11/2023 1022   RBC 4.78 04/14/2020 1827   HGB 12.9 03/11/2023 1022   HGB 13.4 06/30/2017 1507   HCT 40.3 03/11/2023 1022   HCT 40.1 06/30/2017 1507   PLT 394  03/11/2023 1022   MCV 88 03/11/2023 1022   MCV 89.7 06/30/2017 1507   MCH 28.0 03/11/2023 1022   MCH 29.7 04/14/2020 1827   MCHC 32.0 03/11/2023 1022   MCHC 32.9 04/14/2020 1827   RDW 12.3 03/11/2023 1022   RDW 12.6 06/30/2017 1507   LYMPHSABS 0.9 03/11/2023 1022   LYMPHSABS 1.2 06/30/2017 1507   MONOABS 0.4 12/29/2017 0925   MONOABS 0.3 06/30/2017 1507   EOSABS 0.3 03/11/2023 1022   BASOSABS 0.1 03/11/2023 1022   BASOSABS 0.0 06/30/2017 1507      Latest Ref Rng & Units 07/03/2023    2:27 PM 09/25/2022    5:50 AM 04/14/2020    6:27 PM  BMP  Glucose 70 - 99 mg/dL 90  161  096   BUN 6 - 24 mg/dL 19  7  12    Creatinine 0.57 - 1.00 mg/dL 0.45  4.09  8.11   BUN/Creat Ratio 9 - 23 22     Sodium 134 - 144 mmol/L 141  139  137   Potassium 3.5 - 5.2 mmol/L 4.3  3.8  4.2   Chloride 96 - 106 mmol/L 101  101  101   CO2 20 - 29 mmol/L 24   25   Calcium 8.7 - 10.2 mg/dL 9.5   8.7    Chest imaging: CXR 05/23/23 Frontal and lateral views of the chest demonstrate an unremarkable cardiac silhouette. Interval resolution of the multifocal airspace disease seen on prior exam. No acute airspace disease, effusion, or pneumothorax. No acute bony abnormalities.  HRCT Chest 10/27/23 Lungs/Pleura: High-resolution images demonstrate no significant regions of ground-glass attenuation, septal thickening, subpleural reticulation, parenchymal banding, traction bronchiectasis or frank honeycombing to indicate interstitial lung disease. Inspiratory  and expiratory imaging is unremarkable. No acute consolidative airspace disease. No pleural effusions. 3 mm right upper lobe nodule associated with the minor fissure (axial image 75 of series 6), nonspecific, but statistically likely a subpleural lymph node) unchanged, considered definitively benign requiring no future imaging follow-up). No other larger more suspicious appearing pulmonary nodules or masses are noted.   PFT:    Latest Ref Rng & Units  07/07/2023   10:43 AM  PFT Results  FVC-Pre L 2.63   FVC-Predicted Pre % 71   FVC-Post L 2.71   FVC-Predicted Post % 73   Pre FEV1/FVC % % 81   Post FEV1/FCV % % 81   FEV1-Pre L 2.13   FEV1-Predicted Pre % 74   FEV1-Post L 2.18   DLCO uncorrected ml/min/mmHg 14.68   DLCO UNC% % 66   DLCO corrected ml/min/mmHg 14.68   DLCO COR %Predicted % 66   DLVA Predicted % 93   TLC L 4.35   TLC % Predicted % 80   RV % Predicted % 98     Labs:  Path:  Echo:  Heart Catheterization:       Assessment & Plan:   Restrictive airway disease  Chronic cough  Discussion: Ludean Amsbaugh is a 57 year old woman, never smoker with history of hypertension, hypothyroidism, idiopathic thrombocytopenic purpura and multiple sclerosis who returns to pulmonary clinic for chronic cough, dyspnea on exertion and resolved community-acquired pneumonia versus organizing pneumonia.  Her breathing has been doing well since last visit.  Pulmonary function test show mild restriction and mild diffusion defect.  High-resolution CT chest scan 11/6 is unremarkable. No concern for ILD.   Follow up as needed.  Melody Comas, MD [place] [facility] Office: [phone]   Current Outpatient Medications:    albuterol (VENTOLIN HFA) 108 (90 Base) MCG/ACT inhaler, Inhale 1-2 puffs into the lungs every 6 (six) hours as needed for wheezing or shortness of breath., Disp: 8 g, Rfl: 6   amLODipine (NORVASC) 5 MG tablet, Take 5 mg by mouth daily. , Disp: , Rfl: 2   CALCIUM-VITAMIN D PO, Take 1 tablet by mouth daily., Disp: , Rfl:    Multiple Vitamins-Minerals (MULTIVITAMIN PO), Take 1 tablet by mouth daily., Disp: , Rfl:    rosuvastatin (CRESTOR) 5 MG tablet, TAKE 1 TABLET(5 MG) BY MOUTH DAILY, Disp: 30 tablet, Rfl: 0   SYNTHROID 112 MCG tablet, Take 112 mcg by mouth. Alternates daily between 110 and , Disp: , Rfl: 5   TURMERIC PO, Take 1 tablet by mouth daily., Disp: , Rfl:  No current  facility-administered medications for this visit.  Facility-Administered Medications Ordered in Other Visits:    gadopentetate dimeglumine (MAGNEVIST) injection 15 mL, 15 mL, Intravenous, Once PRN, [name], Pearletha Furl, MD

## 2023-10-27 NOTE — Patient Instructions (Signed)
Your CT Chest scan looks good  Continue albuterol inhaler as needed  Follow up as needed

## 2023-11-04 DIAGNOSIS — E538 Deficiency of other specified B group vitamins: Secondary | ICD-10-CM | POA: Diagnosis not present

## 2023-11-04 DIAGNOSIS — D693 Immune thrombocytopenic purpura: Secondary | ICD-10-CM | POA: Diagnosis not present

## 2023-11-04 DIAGNOSIS — R350 Frequency of micturition: Secondary | ICD-10-CM | POA: Diagnosis not present

## 2023-11-04 DIAGNOSIS — E890 Postprocedural hypothyroidism: Secondary | ICD-10-CM | POA: Diagnosis not present

## 2023-11-04 DIAGNOSIS — Z23 Encounter for immunization: Secondary | ICD-10-CM | POA: Diagnosis not present

## 2023-11-04 DIAGNOSIS — Z0000 Encounter for general adult medical examination without abnormal findings: Secondary | ICD-10-CM | POA: Diagnosis not present

## 2023-11-04 DIAGNOSIS — I10 Essential (primary) hypertension: Secondary | ICD-10-CM | POA: Diagnosis not present

## 2023-11-05 DIAGNOSIS — R350 Frequency of micturition: Secondary | ICD-10-CM | POA: Diagnosis not present

## 2023-11-12 ENCOUNTER — Encounter: Payer: Self-pay | Admitting: Neurology

## 2023-11-12 ENCOUNTER — Ambulatory Visit (INDEPENDENT_AMBULATORY_CARE_PROVIDER_SITE_OTHER): Payer: BC Managed Care – PPO | Admitting: Neurology

## 2023-11-12 VITALS — BP 135/92 | HR 67 | Ht 65.0 in | Wt 157.5 lb

## 2023-11-12 DIAGNOSIS — R41840 Attention and concentration deficit: Secondary | ICD-10-CM | POA: Diagnosis not present

## 2023-11-12 DIAGNOSIS — R260 Ataxic gait: Secondary | ICD-10-CM | POA: Diagnosis not present

## 2023-11-12 DIAGNOSIS — G35 Multiple sclerosis: Secondary | ICD-10-CM

## 2023-11-12 DIAGNOSIS — F09 Unspecified mental disorder due to known physiological condition: Secondary | ICD-10-CM

## 2023-11-12 DIAGNOSIS — Z79899 Other long term (current) drug therapy: Secondary | ICD-10-CM

## 2023-11-12 MED ORDER — PHENTERMINE HCL 37.5 MG PO CAPS: 37.5000 mg | ORAL_CAPSULE | ORAL | 5 refills | Status: DC
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
## 2023-11-13 LAB — CBC WITH DIFFERENTIAL/PLATELET
Basophils Absolute: 0.1 10*3/uL (ref 0.0–0.2)
Basos: 2 %
EOS (ABSOLUTE): 0.1 10*3/uL (ref 0.0–0.4)
Eos: 3 %
Hematocrit: 44.3 % (ref 34.0–46.6)
Hemoglobin: 14.3 g/dL (ref 11.1–15.9)
Immature Grans (Abs): 0 10*3/uL (ref 0.0–0.1)
Immature Granulocytes: 0 %
Lymphocytes Absolute: 1.2 10*3/uL (ref 0.7–3.1)
Lymphs: 26 %
MCH: 29.1 pg (ref 26.6–33.0)
MCHC: 32.3 g/dL (ref 31.5–35.7)
MCV: 90 fL (ref 79–97)
Monocytes Absolute: 0.5 10*3/uL (ref 0.1–0.9)
Monocytes: 12 %
Neutrophils Absolute: 2.6 10*3/uL (ref 1.4–7.0)
Neutrophils: 57 %
Platelets: 254 10*3/uL (ref 150–450)
RBC: 4.92 x10E6/uL (ref 3.77–5.28)
RDW: 12.6 % (ref 11.7–15.4)
WBC: 4.4 10*3/uL (ref 3.4–10.8)

## 2023-11-13 LAB — IGG, IGA, IGM
IgA/Immunoglobulin A, Serum: 32 mg/dL — ABNORMAL LOW (ref 87–352)
IgG (Immunoglobin G), Serum: 874 mg/dL (ref 586–1602)
IgM (Immunoglobulin M), Srm: 44 mg/dL (ref 26–217)

## 2023-11-14 NOTE — Progress Notes (Signed)
Office Visit Note  Patient: Ashley Key             Date of Birth: 06/14/1966           MRN: [id number]             PCP: Farris Has, MD Referring: Asa Lente, MD Visit Date: 11/28/2023 Occupation: @GUAROCC @  Subjective:  Dry mouth and dry eyes, pain in hands  History of Present Illness: Ashley Key is a 57 y.o. female seen in consultation per request of Dr. Sherol Dade for the evaluation of Sjogren's.  According the patient she has had dry mouth and dry eyes for at least 2 years.  She had seen an ophthalmologist and has been using eyedrops for the last 6 to 7 months.  She also uses an oral spray.  She denies any history of shortness of breath or any rash.  There is no history of oral ulcers, nasal ulcers, malar rash, photosensitivity, Raynaud's or lymphadenopathy.  There is no history of inflammatory arthritis.  She states she had right foot bunionectomy about 2 years ago and has noticed hammertoes.  She does not have much discomfort in her feet.  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  She has noticed increased discomfort in her hands for the last 1 year.  She describes discomfort mostly over her CMC joints.  None of the other joints are painful.  There is no family history of autoimmune disease.  She is gravida 0.  She has 1 adopted child.  She walks for exercise.    Activities of Daily Living:  Patient reports morning stiffness for 0 minutes.   Patient Denies nocturnal pain.  Difficulty dressing/grooming: Denies Difficulty climbing stairs: Denies Difficulty getting out of chair: Denies Difficulty using hands for taps, buttons, cutlery, and/or writing: Reports  Review of Systems  Constitutional:  Positive for fatigue.  HENT:  Negative for mouth sores and mouth dryness.   Eyes:  Positive for dryness.  Respiratory:  Negative for shortness of breath and wheezing.   Cardiovascular:  Negative for chest pain and palpitations.  Gastrointestinal:  Negative for  blood in stool, constipation and diarrhea.  Endocrine: Negative for increased urination.  Genitourinary:  Positive for nocturia. Negative for involuntary urination.  Musculoskeletal:  Positive for joint pain and joint pain. Negative for gait problem, joint swelling, myalgias, muscle weakness, morning stiffness, muscle tenderness and myalgias.  Skin:  Negative for color change, rash, hair loss and sensitivity to sunlight.  Allergic/Immunologic: Positive for susceptible to infections.  Neurological:  Negative for dizziness and headaches.  Hematological:  Negative for swollen glands.  Psychiatric/Behavioral:  Positive for sleep disturbance. The patient is nervous/anxious.     PMFS History:  Patient Active Problem List   Diagnosis Date Noted   CAP vs BOOP 05/08/2023   Upper airway cough syndrome 05/07/2023   Depression 12/11/2021   Diplopia 08/10/2019   Cognitive deficit secondary to multiple sclerosis (HCC) 01/13/2018   Multiple sclerosis (HCC) 10/23/2017   High risk medication use 10/23/2017   White matter abnormality on MRI of brain 10/15/2017   Numbness 10/15/2017   Memory loss 10/15/2017   Ataxic gait 10/15/2017   Attention deficit 10/15/2017   Chest pain, unspecified 01/18/2016   Screening, lipid 01/18/2016   Essential hypertension 01/18/2016   Hypothyroidism 01/18/2016   Idiopathic thrombocytopenic purpura (HCC) 06/27/2014   Thrombocytopenia (HCC) 06/27/2014    Past Medical History:  Diagnosis Date   ADHD (attention deficit hyperactivity disorder)  Arch pain    Arthritis    FEET   Ataxic gait 10/15/2017   Attention deficit 10/15/2017   Blood dyscrasia    NORMAL PLATLETS 50-70'S   Bunion    Chest pain, unspecified 01/18/2016   Cognitive deficit secondary to multiple sclerosis (HCC) 01/13/2018   Diplopia 08/10/2019   Essential hypertension 01/18/2016   Graves disease    Headache    HISTORY MIGRAINE   Hypertension    Hypothyroidism 01/18/2016   Idiopathic thrombocytopenic  purpura (HCC) 06/27/2014   ITP (idiopathic thrombocytopenic purpura)    Memory loss 10/15/2017   Multiple sclerosis (HCC) 10/23/2017   Numbness 10/15/2017   Pelvic fracture (HCC)    Pneumonia    Screening, lipid 01/18/2016   Thrombocytopenia (HCC) 06/27/2014   White matter abnormality on MRI of brain 10/15/2017    Family History  Problem Relation Age of Onset   CAD Mother    Heart attack Mother    Stroke Father    Heart attack Father    CAD Brother    Heart attack Brother    Cancer Brother        prostate cancer   Heart attack Maternal Grandmother    Stroke Maternal Grandmother    Stroke Paternal Grandmother    Past Surgical History:  Procedure Laterality Date   DILITATION & CURRETTAGE/HYSTROSCOPY WITH HYDROTHERMAL ABLATION N/A 09/27/2015   Procedure: DILATATION & CURETTAGE/HYSTEROSCOPY WITH HYDROTHERMAL ABLATION;  Surgeon: Geryl Rankins, MD;  Location: [facility];  Service: Gynecology;  Laterality: N/A;  ultrasound guidance    FOOT SURGERY Right    THYROID RADATION 2016     WISDOM TOOTH EXTRACTION     Social History   Social History Narrative   Married, 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  Constitutional:      Appearance: She is well-developed.  HENT:     Head: Normocephalic and atraumatic.  Eyes:     Conjunctiva/sclera: Conjunctivae normal.  Cardiovascular:     Rate and Rhythm: Normal rate and regular  rhythm.     Heart sounds: Normal heart sounds.  Pulmonary:     Effort: Pulmonary effort is normal.     Breath sounds: Normal breath sounds.  Abdominal:     General: Bowel sounds are normal.     Palpations: Abdomen is soft.  Musculoskeletal:     Cervical back: Normal range of motion.  Lymphadenopathy:     Cervical: No cervical adenopathy.  Skin:    General: Skin is warm and dry.     Capillary Refill: Capillary refill takes less than 2 seconds.  Neurological:     Mental Status: She is alert and oriented to person, place, and time.     Comments: Patient had unstable gait.  She had difficulty walking on her tiptoes.  Psychiatric:  Behavior: Behavior normal.      Musculoskeletal Exam: Cervical, thoracic and lumbar spine 1 good range of motion.  Shoulder joints, elbow joints, wrist joints with good range of motion.  She had bilateral CMC thickening, more prominent in the left hand.  No MCP, PIP or DIP thickening was noted.  Hip joints and knee joints in good range of motion.  She had bilateral pes cavus and hammertoes more prominent in the right foot.  CDAI Exam: CDAI Score: -- Patient Global: --; Provider Global: -- Swollen: --; Tender: -- Joint Exam 11/28/2023   No joint exam has been documented for this visit   There is currently no information documented on the homunculus. Go to the Rheumatology activity and complete the homunculus joint exam.  Investigation: No additional findings.  Imaging: No results found.  Recent Labs: Lab Results  Component Value Date   WBC 4.4 11/12/2023   HGB 14.3 11/12/2023   PLT 254 11/12/2023   NA 141 07/03/2023   K 4.3 07/03/2023   CL 101 07/03/2023   CO2 24 07/03/2023   GLUCOSE 90 07/03/2023   BUN 19 07/03/2023   CREATININE 0.88 07/03/2023   BILITOT <0.2 07/03/2023   ALKPHOS 96 07/03/2023   AST 20 07/03/2023   ALT 9 07/03/2023   PROT 7.0 07/03/2023   ALBUMIN 4.6 07/03/2023   CALCIUM 9.5 07/03/2023   GFRAA >60 04/14/2020    QFTBGOLDPLUS Negative 07/03/2023    Speciality Comments: No specialty comments available.  Procedures:  No procedures performed Allergies: Patient has no known allergies.   Assessment / Plan:     Visit Diagnoses: Sjogren's syndrome with other organ involvement (HCC) - 07/03/23: Ro 1.3, La negative, IgA 32-low, rest of immunoglobulins WNL, TB gold negative -patient complains of dry mouth and dry eyes.  She has been using over-the-counter eyedrops.  Over-the-counter products for dry mouth were discussed.  I also discussed option of pilocarpine which she declined.  She denies any shortness of breath.  She states she developed COVID-19 virus infection about 2 years ago and had pulmonary symptoms which resolved over time.  No lymphadenopathy was noted.  Increased risk of lymphoma and ILD with Sjogren's was discussed.  She has no history of inflammatory arthritis.  She continues to have some joint pain.  Plan: Urinalysis, Routine w reflex microscopic, Rheumatoid factor, Serum protein electrophoresis with reflex, ANA, RNP Antibody, Anti-Smith antibody, Anti-DNA antibody, double-stranded, C3 and C4.  A handout on Sjogren's was provided.  She was advised to contact us if she develops any new symptoms.  I will discuss results at the follow-up visit.  Other fatigue - plan: CK  Pain in both hands -she complains of discomfort in the bilateral hands.  The discomfort is mostly over her CMC joints.  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  She is right-hand dominant.  She had more prominence of the left CMC joint.  A prescription for left CMC brace was given.  Joint protection muscle strengthening was discussed.  A handout on hand exercises was given.  Plan: XR Hand 2 View Right, XR Hand 2 View Left.  X-rays of bilateral hands were suggestive of osteoarthritis.  Severe CMC PIP and DIP narrowing was noted.  Left CMC severe narrowing and subluxation was noted.  Primary osteoarthritis of both feet-she  has bilateral hammertoes and pes cavus.  She had first MTP bunionectomy.  Plan for fitting shoes with arch support were advised.  Hypermobility of joint-she is happy mobility in almost  all of her joints.  Multiple sclerosis (HCC)-diagnosed 5 years ago.  She has been followed by Dr. Sherol Dade.  Cognitive deficit secondary to multiple sclerosis (HCC)-patient Raynauds of short-term memory loss.  Idiopathic thrombocytopenic purpura (HCC)-platelets were normal in December 2024.  Essential hypertension-blood pressure is normal today.  Postoperative hypothyroidism  Ataxic gait-due to multiple sclerosis.  A handout on lower extremity muscle strength exercises was given.  Patient walks daily for exercise.  Attention deficit  History of depression  Stress-patient states she has been under a lot of stress due to family situation.  Orders: Orders Placed This Encounter  Procedures   XR Hand 2 View Right   XR Hand 2 View Left   Urinalysis, Routine w reflex microscopic   CK   Rheumatoid factor   Serum protein electrophoresis with reflex   ANA   RNP Antibody   Anti-Smith antibody   Anti-DNA antibody, double-stranded   C3 and C4   No orders of the defined types were placed in this encounter.    Follow-Up Instructions: Return for Sjogren's, Osteoarthritis.   Pollyann Savoy, MD  Note - This record has been created using Animal nutritionist.  Chart creation errors have been sought, but may not always  have been located. Such creation errors do not reflect on  the standard of medical care.

## 2023-11-26 DIAGNOSIS — G35 Multiple sclerosis: Secondary | ICD-10-CM | POA: Diagnosis not present

## 2023-11-28 ENCOUNTER — Ambulatory Visit: Payer: BC Managed Care – PPO

## 2023-11-28 ENCOUNTER — Encounter: Payer: Self-pay | Admitting: Rheumatology

## 2023-11-28 ENCOUNTER — Ambulatory Visit: Payer: BC Managed Care – PPO | Attending: Rheumatology | Admitting: Rheumatology

## 2023-11-28 VITALS — BP 117/81 | HR 74 | Resp 16 | Ht 65.5 in | Wt 156.8 lb

## 2023-11-28 DIAGNOSIS — E890 Postprocedural hypothyroidism: Secondary | ICD-10-CM

## 2023-11-28 DIAGNOSIS — M79642 Pain in left hand: Secondary | ICD-10-CM | POA: Diagnosis not present

## 2023-11-28 DIAGNOSIS — M79641 Pain in right hand: Secondary | ICD-10-CM

## 2023-11-28 DIAGNOSIS — Z8659 Personal history of other mental and behavioral disorders: Secondary | ICD-10-CM

## 2023-11-28 DIAGNOSIS — M19072 Primary osteoarthritis, left ankle and foot: Secondary | ICD-10-CM

## 2023-11-28 DIAGNOSIS — R260 Ataxic gait: Secondary | ICD-10-CM

## 2023-11-28 DIAGNOSIS — F09 Unspecified mental disorder due to known physiological condition: Secondary | ICD-10-CM

## 2023-11-28 DIAGNOSIS — R5383 Other fatigue: Secondary | ICD-10-CM

## 2023-11-28 DIAGNOSIS — G35 Multiple sclerosis: Secondary | ICD-10-CM

## 2023-11-28 DIAGNOSIS — R41840 Attention and concentration deficit: Secondary | ICD-10-CM

## 2023-11-28 DIAGNOSIS — F439 Reaction to severe stress, unspecified: Secondary | ICD-10-CM

## 2023-11-28 DIAGNOSIS — D693 Immune thrombocytopenic purpura: Secondary | ICD-10-CM

## 2023-11-28 DIAGNOSIS — M19071 Primary osteoarthritis, right ankle and foot: Secondary | ICD-10-CM | POA: Diagnosis not present

## 2023-11-28 DIAGNOSIS — M3509 Sicca syndrome with other organ involvement: Secondary | ICD-10-CM

## 2023-11-28 DIAGNOSIS — I10 Essential (primary) hypertension: Secondary | ICD-10-CM

## 2023-11-28 DIAGNOSIS — M249 Joint derangement, unspecified: Secondary | ICD-10-CM

## 2023-11-28 DIAGNOSIS — H04123 Dry eye syndrome of bilateral lacrimal glands: Secondary | ICD-10-CM

## 2023-11-28 NOTE — Patient Instructions (Addendum)
Hand Exercises Hand exercises can be helpful for almost anyone. They can strengthen your hands and improve flexibility and movement. The exercises can also increase blood flow to the hands. These results can make your work and daily tasks easier for you. Hand exercises can be especially helpful for people who have joint pain from arthritis or nerve damage from using their hands over and over. These exercises can also help people who injure a hand. Exercises Most of these hand exercises are gentle stretching and motion exercises. It is usually safe to do them often throughout the day. Warming up your hands before exercise may help reduce stiffness. You can do this with gentle massage or by placing your hands in warm water for 10-15 minutes. It is normal to feel some stretching, pulling, tightness, or mild discomfort when you begin new exercises. In time, this will improve. Remember to always be careful and stop right away if you feel sudden, very bad pain or your pain gets worse. You want to get better and be safe. Ask your health care provider which exercises are safe for you. Do exercises exactly as told by your provider and adjust them as told. Do not begin these exercises until told by your provider. Knuckle bend or "claw" fist  Stand or sit with your arm, hand, and all five fingers pointed straight up. Make sure to keep your wrist straight. Gently bend your fingers down toward your palm until the tips of your fingers are touching your palm. Keep your big knuckle straight and only bend the small knuckles in your fingers. Hold this position for 10 seconds. Straighten your fingers back to your starting position. Repeat this exercise 5-10 times with each hand. Full finger fist  Stand or sit with your arm, hand, and all five fingers pointed straight up. Make sure to keep your wrist straight. Gently bend your fingers into your palm until the tips of your fingers are touching the middle of your  palm. Hold this position for 10 seconds. Extend your fingers back to your starting position, stretching every joint fully. Repeat this exercise 5-10 times with each hand. Straight fist  Stand or sit with your arm, hand, and all five fingers pointed straight up. Make sure to keep your wrist straight. Gently bend your fingers at the big knuckle, where your fingers meet your hand, and at the middle knuckle. Keep the knuckle at the tips of your fingers straight and try to touch the bottom of your palm. Hold this position for 10 seconds. Extend your fingers back to your starting position, stretching every joint fully. Repeat this exercise 5-10 times with each hand. Tabletop  Stand or sit with your arm, hand, and all five fingers pointed straight up. Make sure to keep your wrist straight. Gently bend your fingers at the big knuckle, where your fingers meet your hand, as far down as you can. Keep the small knuckles in your fingers straight. Think of forming a tabletop with your fingers. Hold this position for 10 seconds. Extend your fingers back to your starting position, stretching every joint fully. Repeat this exercise 5-10 times with each hand. Finger spread  Place your hand flat on a table with your palm facing down. Make sure your wrist stays straight. Spread your fingers and thumb apart from each other as far as you can until you feel a gentle stretch. Hold this position for 10 seconds. Bring your fingers and thumb tight together again. Hold this position for 10 seconds. Repeat  this exercise 5-10 times with each hand. Making circles  Stand or sit with your arm, hand, and all five fingers pointed straight up. Make sure to keep your wrist straight. Make a circle by touching the tip of your thumb to the tip of your index finger. Hold for 10 seconds. Then open your hand wide. Repeat this motion with your thumb and each of your fingers. Repeat this exercise 5-10 times with each hand. Thumb  motion  Sit with your forearm resting on a table and your wrist straight. Your thumb should be facing up toward the ceiling. Keep your fingers relaxed as you move your thumb. Lift your thumb up as high as you can toward the ceiling. Hold for 10 seconds. Bend your thumb across your palm as far as you can, reaching the tip of your thumb for the small finger (pinkie) side of your palm. Hold for 10 seconds. Repeat this exercise 5-10 times with each hand. Grip strengthening  Hold a stress ball or other soft ball in the middle of your hand. Slowly increase the pressure, squeezing the ball as much as you can without causing pain. Think of bringing the tips of your fingers into the middle of your palm. All of your finger joints should bend when doing this exercise. Hold your squeeze for 10 seconds, then relax. Repeat this exercise 5-10 times with each hand. Contact a health care provider if: Your hand pain or discomfort gets much worse when you do an exercise. Your hand pain or discomfort does not improve within 2 hours after you exercise. If you have either of these problems, stop doing these exercises right away. Do not do them again unless your provider says that you can. Get help right away if: You develop sudden, severe hand pain or swelling. If this happens, stop doing these exercises right away. Do not do them again unless your provider says that you can. This information is not intended to replace advice given to you by your health care provider. Make sure you discuss any questions you have with your health care provider. Document Revised: 12/10/2022 Document Reviewed: 12/10/2022 Elsevier Patient Education  2024 [employer] Exercises for Chronic Knee Pain Chronic knee pain is pain that lasts longer than 3 months. For most people with chronic knee pain, exercise and weight loss is an important part of treatment. Your health care provider may want you to focus on: Making the muscles that  support your knee stronger. This can take pressure off your knee and reduce pain. Preventing knee stiffness. How far you can move your knee, keeping it there or making it farther. Losing weight (if this applies) to take pressure off your knee, lower your risk for injury, and make it easier for you to exercise. Your provider will help you make an exercise program that fits your needs and physical abilities. Below are simple, low-impact exercises you can do at home. Ask your provider or physical therapist how often you should do your exercise program and how many times to repeat each exercise. General safety tips  Get your provider's approval before doing any exercises. Start slowly and stop any time you feel pain. Do not exercise if your knee pain is flaring up. Warm up first. Stretching a cold muscle can cause an injury. Do 5-10 minutes of easy movement or light stretching before beginning your exercises. Do 5-10 minutes of low-impact activity (like walking or cycling) before starting strengthening exercises. Contact your provider any time you have pain during  or after exercising. Exercise can cause discomfort but should not be painful. It is normal to be a little stiff or sore after exercising. Stretching and range-of-motion exercises Front thigh stretch  Stand up straight and support your body by holding on to a chair or resting one hand on a wall. With your legs straight and close together, bend one knee to lift your heel up toward your butt. Using one hand for support, grab your ankle with your free hand. Pull your foot up closer toward your butt to feel the stretch in front of your thigh. Hold the stretch for 30 seconds. Repeat __________ times. Complete this exercise __________ times a day. Back thigh stretch  Sit on the floor with your back straight and your legs out straight in front of you. Place the palms of your hands on the floor and slide them toward your feet as you bend at the  hip. Try to touch your nose to your knees and feel the stretch in the back of your thighs. Hold for 30 seconds. Repeat __________ times. Complete this exercise __________ times a day. Calf stretch  Stand facing a wall. Place the palms of your hands flat against the wall, arms extended, and lean slightly against the wall. Get into a lunge position with one leg bent at the knee and the other leg stretched out straight behind you. Keep both feet facing the wall and increase the bend in your knee while keeping the heel of the other leg flat on the ground. You should feel the stretch in your calf. Hold for 30 seconds. Repeat __________ times. Complete this exercise __________ times a day. Strengthening exercises Straight leg lift  Lie on your back with one knee bent and the other leg out straight. Slowly lift the straight leg without bending the knee. Lift until your foot is about 12 inches (30 cm) off the floor. Hold for 3-5 seconds and slowly lower your leg. Repeat __________ times. Complete this exercise __________ times a day. Single leg dip  Stand between two chairs and put both hands on the backs of the chairs for support. Extend one leg out straight with your body weight resting on the heel of the standing leg. Slowly bend your standing knee to dip your body to the level that is comfortable for you. Hold for 3-5 seconds. Repeat __________ times. Complete this exercise __________ times a day. Hamstring curls  Stand straight, knees close together, facing the back of a chair. Hold on to the back of a chair with both hands. Keep one leg straight. Bend the other knee while bringing the heel up toward the butt until the knee is bent at a 90-degree angle (right angle). Hold for 3-5 seconds. Repeat __________ times. Complete this exercise __________ times a day. Wall squat  Stand straight with your back, hips, and head against a wall. Step forward one foot at a time with your back  still against the wall. Your feet should be 2 feet (61 cm) from the wall at shoulder width. Keeping your back, hips, and head against the wall, slide down the wall to as close to a sitting position as you can get. Hold for 5-10 seconds, then slowly slide back up. Repeat __________ times. Complete this exercise __________ times a day. Step-ups  Stand in front of a sturdy platform or stool that is about 6 inches (15 cm) high. Slowly step up with your left / right foot, keeping your knee in line with your hip  and foot. Do not let your knee bend so far that you cannot see your toes. Hold on to a chair for balance, but do not use it for support. Slowly unlock your knee and lower yourself to the starting position. Repeat __________ times. Complete this exercise __________ times a day. Contact a health care provider if: Your exercises cause pain. Your pain is worse after you exercise. Your pain prevents you from doing your exercises. This information is not intended to replace advice given to you by your health care provider. Make sure you discuss any questions you have with your health care provider. Document Revised: 12/10/2022 Document Reviewed: 12/10/2022 Elsevier Patient Education  2024 [employer]  Sjogren's Syndrome Sjgren's syndrome is an inflammatory disease in which the body's disease-fighting system (immune system) attacks the glands that produce tears (lacrimal glands) and the glands that produce saliva (salivary glands). This makes the eyes and mouth very dry. Sjgren's syndrome can also affect other parts of the body, causing dryness of the skin, nose, throat, and vagina. Sjgren's syndrome is a long-term (chronic) disorder that has no cure. In some cases, it is linked to other disorders (rheumatic disorders), such as rheumatoid arthritis and systemic lupus erythematosus (SLE). It may affect other parts of the body, such as the: Blood vessels. Joints. Lungs. Kidneys. Liver or  pancreas. Brain, nerves, or spinal cord. What are the causes? The cause of this condition is not known. It may be passed along from parent to child (inherited), or it may be a symptom of a rheumatic disorder. What increases the risk? This condition is more likely to develop in: Women. People who are 56-37 years old and older. People who have recently had a viral infection or currently have a viral infection. What are the signs or symptoms? The main symptoms of this condition are: Dry mouth. This may include: A chalky feeling. Difficulty swallowing, speaking, or tasting. Frequent cavities in the teeth. Frequent mouth infections. Dry eyes. This may include: Burning, redness, and itching. Blurry vision. Fluctuating vision. Light sensitivity. Other symptoms may include: Dryness of the skin and the inside of the nose. Eyelid infections. Vaginal dryness (if applicable). Joint pain and stiffness. Muscle pain and stiffness. How is this diagnosed? This condition is diagnosed based on: Your symptoms. Your medical history. A physical exam of your eyes and mouth. Tests, including: A Schirmer test. This tests your tear production. An eye exam that is done with a magnifying device (slit-lamp exam). An eye test that temporarily stains your eye with special dyes. This shows the extent of eye damage. Tests to check your salivary gland function. Biopsy. This is a removal of part of a salivary gland from inside your lower lip to be studied under a microscope. Chest X-rays. Blood or urine tests. How is this treated? There is no cure for this condition, but treatment can help you manage your symptoms. You may be asked to see a rheumatologist for further evaluation and treatment. This condition may be treated with: Medicines to help relieve pain and stiffness. Medicines to help relieve inflammation in your body (corticosteroids). These are usually for severe cases. Medicines to help reduce the  activity of your immune system (immunosuppressants). These are usually prescribed by your health care provider or a rheumatologist. Moisture replacement therapies to help relieve dryness in your skin, mouth, and eyes. Dry eyes may be treated with: Eye drops or nasal sprays to improve dryness of the eyes. Surgery or insertion of plugs to close the lacrimal glands (  punctal occlusion). This helps keep more natural tears in your eyes. Soft contact lenses or hard scleral lenses. These are occasionally used to protect the surface of the eye. Biologic lubricating eye drops (serum tears). These are eye drops made from a person's own blood. They are used in some people with severe dry eye. Follow these instructions at home: Eye care  Use eye drops and other medicines as told by your health care provider. Protect your eyes from the sun and wind with sunglasses or glasses. Blink at least 5-6 times a minute. Maintain properly humidified air. You may want to use a humidifier at home and at work. Avoid smoke. Mouth care Brush your teeth and floss after every meal. Chew sugar-free gum or suck on hard candy. This may help to relieve dry mouth. Use antimicrobial mouthwash daily. Take frequent sips of water or sugar-free drinks. Use saliva substitutes or lip balm as told by your health care provider. See your dentist every 6 months. General instructions  Take over-the-counter and prescription medicines only as told by your health care provider. Drink enough fluid to keep your urine pale yellow. Keep all follow-up visits. This is important. Contact a health care provider if: You have a fever. You have night sweats. You are always tired. You have unexplained weight loss. You develop itchy skin. You have red patches on your skin. You have a lump or swelling on your neck. Get help right away if: You develop severe eye pain. You develop sudden decreased vision. Summary Sjgren's syndrome is a disease  in which the body's immune system attacks the glands that produce tears and the glands that produce saliva. This condition makes the eyes and mouth very dry. Sjgren's syndrome is a long-term (chronic) disorder. There is no cure for this condition, but treatment can help you manage your symptoms. The cause of this condition is not known. You may be asked to see a rheumatologist for further evaluation and treatment. This information is not intended to replace advice given to you by your health care provider. Make sure you discuss any questions you have with your health care provider. Document Revised: 06/25/2021 Document Reviewed: 06/25/2021 Elsevier Patient Education  2024 [employer].

## 2023-11-30 NOTE — Progress Notes (Signed)
UA 1+ white cells not significant,, ANA low titer positive, not significant, C3 to C4 normal, double-stranded DNA negative, Smith negative, RNP negative, rheumatoid factor negative, SPEP pending

## 2023-12-04 LAB — RNP ANTIBODY: Ribonucleic Protein(ENA) Antibody, IgG: <1 AI

## 2023-12-04 LAB — URINALYSIS, ROUTINE W REFLEX MICROSCOPIC
Bacteria, UA: NONE SEEN /HPF
Bilirubin Urine: NEGATIVE
Glucose, UA: NEGATIVE
Hgb urine dipstick: NEGATIVE
Hyaline Cast: NONE SEEN /LPF
Ketones, ur: NEGATIVE
Nitrite: NEGATIVE
Protein, ur: NEGATIVE
RBC / HPF: NONE SEEN /HPF (ref 0–2)
Specific Gravity, Urine: 1.013 (ref 1.001–1.035)
Squamous Epithelial / HPF: NONE SEEN /HPF (ref <=5)
pH: 6 (ref 5.0–8.0)

## 2023-12-04 LAB — C3 AND C4
C3 Complement: 122 mg/dL (ref 83–193)
C4 Complement: 29 mg/dL (ref 15–57)

## 2023-12-04 LAB — ANTI-SMITH ANTIBODY: ENA SM Ab Ser-aCnc: <1 AI

## 2023-12-04 LAB — ANTI-NUCLEAR AB-TITER (ANA TITER): ANA Titer 1: 1:80 {titer} — ABNORMAL HIGH

## 2023-12-04 LAB — PROTEIN ELECTROPHORESIS, SERUM, WITH REFLEX
Albumin ELP: 4.7 g/dL (ref 3.8–4.8)
Alpha 1: 0.3 g/dL (ref 0.2–0.3)
Alpha 2: 0.8 g/dL (ref 0.5–0.9)
Beta 2: 0.2 g/dL (ref 0.2–0.5)
Beta Globulin: 0.4 g/dL (ref 0.4–0.6)
Gamma Globulin: 0.7 g/dL — ABNORMAL LOW (ref 0.8–1.7)
Total Protein: 7.1 g/dL (ref 6.1–8.1)

## 2023-12-04 LAB — RHEUMATOID FACTOR: Rheumatoid fact SerPl-aCnc: <10 [IU]/mL (ref <14)

## 2023-12-04 LAB — IFE INTERPRETATION

## 2023-12-04 LAB — ANTI-DNA ANTIBODY, DOUBLE-STRANDED: ds DNA Ab: <1 [IU]/mL

## 2023-12-04 LAB — MICROSCOPIC MESSAGE

## 2023-12-04 LAB — ANA: Anti Nuclear Antibody (ANA): POSITIVE — AB

## 2023-12-04 LAB — CK: Total CK: 88 U/L (ref 29–143)

## 2023-12-22 ENCOUNTER — Other Ambulatory Visit: Payer: Self-pay | Admitting: Family Medicine

## 2023-12-22 DIAGNOSIS — Z1231 Encounter for screening mammogram for malignant neoplasm of breast: Secondary | ICD-10-CM

## 2023-12-31 ENCOUNTER — Telehealth: Payer: Self-pay

## 2023-12-31 NOTE — Telephone Encounter (Signed)
Called pt originally to discuss her [employer] form and get her household size and annual household income, did receive that information.  Pt then asked if she had an appointment next week, told pt that her next appointment was 06/03/2024, Pt states that she thought she had one next week, let her know that she canceled her appointment back in December. Pt stated that she really needs a sooner appointment due to her having some issues that she believes is (MS) related. Pt states that she doesn't remember what day of the week it is as well as not knowing what month it is. Pt states that she has low motivation to do things. Rescheduled pt with Dr. Epimenio Foot for 01/05/2024 @3 :30pm.

## 2024-01-01 ENCOUNTER — Telehealth: Payer: Self-pay

## 2024-01-01 NOTE — Telephone Encounter (Signed)
Faxed [facility] Start Form to [employer] [phone]

## 2024-01-05 ENCOUNTER — Ambulatory Visit (INDEPENDENT_AMBULATORY_CARE_PROVIDER_SITE_OTHER): Payer: Medicare HMO | Admitting: Neurology

## 2024-01-05 ENCOUNTER — Encounter: Payer: Self-pay | Admitting: Neurology

## 2024-01-05 VITALS — BP 123/83 | HR 77 | Ht 66.0 in | Wt 157.5 lb

## 2024-01-05 DIAGNOSIS — F32A Depression, unspecified: Secondary | ICD-10-CM

## 2024-01-05 DIAGNOSIS — Z79899 Other long term (current) drug therapy: Secondary | ICD-10-CM

## 2024-01-05 DIAGNOSIS — R260 Ataxic gait: Secondary | ICD-10-CM | POA: Diagnosis not present

## 2024-01-05 DIAGNOSIS — F09 Unspecified mental disorder due to known physiological condition: Secondary | ICD-10-CM

## 2024-01-05 DIAGNOSIS — G35 Multiple sclerosis: Secondary | ICD-10-CM | POA: Diagnosis not present

## 2024-01-05 DIAGNOSIS — R41840 Attention and concentration deficit: Secondary | ICD-10-CM

## 2024-01-05 MED ORDER — AMPHETAMINE-DEXTROAMPHET ER 20 MG PO CP24: 20.0000 mg | ORAL_CAPSULE | ORAL | 0 refills | Status: DC

## 2024-01-05 NOTE — Telephone Encounter (Signed)
Re-faxed pt's (Fixed) Start Form for Ocrevus to [facility] [phone] on 01/05/2024
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
## 2024-01-06 NOTE — Progress Notes (Signed)
Office Visit Note  Patient: Ashley Key             Date of Birth: Oct 10, 1966           MRN: [id number]             PCP: Farris Has, MD Referring: Farris Has, MD Visit Date: 01/20/2024 Occupation: @GUAROCC @  Subjective:  Dry mouth and dry eyes  History of Present Illness: Ashley Key is a 58 y.o. female returns today after her initial evaluation for Sjogren's and joint pain.  She states she continues to have dry mouth and dry eyes and dry skin.  She also complains of vaginal dryness.  She states she has stiffness in her joints especially her hands and feet.  She has not noticed any joint swelling.  She denies any history of parotid swelling, shortness of breath or palpitations.  There is no history of oral ulcers, nasal ulcers, malar rash, photosensitivity, Raynaud's or lymphadenopathy.    Activities of Daily Living:  Patient reports morning stiffness for 0 minute.   Patient Denies nocturnal pain.  Difficulty dressing/grooming: Denies Difficulty climbing stairs: Denies Difficulty getting out of chair: Denies Difficulty using hands for taps, buttons, cutlery, and/or writing: Reports  Review of Systems  Constitutional:  Positive for fatigue.  HENT:  Positive for mouth dryness. Negative for mouth sores.   Eyes:  Positive for dryness.  Respiratory:  Negative for shortness of breath.   Cardiovascular:  Negative for chest pain and palpitations.  Gastrointestinal:  Negative for blood in stool, constipation and diarrhea.  Endocrine: Negative for increased urination.  Genitourinary:  Negative for involuntary urination.  Musculoskeletal:  Positive for joint pain, gait problem, joint pain and joint swelling. Negative for myalgias, muscle weakness, morning stiffness, muscle tenderness and myalgias.  Skin:  Negative for color change, rash, hair loss and sensitivity to sunlight.  Allergic/Immunologic: Positive for susceptible to infections.  Neurological:  Negative for dizziness  and headaches.  Hematological:  Negative for swollen glands.  Psychiatric/Behavioral:  Positive for depressed mood and sleep disturbance. The patient is not nervous/anxious.     PMFS History:  Patient Active Problem List   Diagnosis Date Noted   CAP vs BOOP 05/08/2023   Upper airway cough syndrome 05/07/2023   Depression 12/11/2021   Diplopia 08/10/2019   Cognitive deficit secondary to multiple sclerosis (HCC) 01/13/2018   Multiple sclerosis (HCC) 10/23/2017   High risk medication use 10/23/2017   White matter abnormality on MRI of brain 10/15/2017   Numbness 10/15/2017   Memory loss 10/15/2017   Ataxic gait 10/15/2017   Attention deficit 10/15/2017   Chest pain, unspecified 01/18/2016   Screening, lipid 01/18/2016   Essential hypertension 01/18/2016   Hypothyroidism 01/18/2016   Idiopathic thrombocytopenic purpura (HCC) 06/27/2014   Thrombocytopenia (HCC) 06/27/2014    Past Medical History:  Diagnosis Date   ADHD (attention deficit hyperactivity disorder)    Arch pain    Arthritis    FEET   Ataxic gait 10/15/2017   Attention deficit 10/15/2017   Blood dyscrasia    NORMAL PLATLETS 50-70'S   Bunion    Chest pain, unspecified 01/18/2016   Cognitive deficit secondary to multiple sclerosis (HCC) 01/13/2018   Diplopia 08/10/2019   Essential hypertension 01/18/2016   Graves disease    Headache    HISTORY MIGRAINE   Hypertension    Hypothyroidism 01/18/2016   Idiopathic thrombocytopenic purpura (HCC) 06/27/2014   ITP (idiopathic thrombocytopenic purpura)    Memory loss 10/15/2017  Multiple sclerosis (HCC) 10/23/2017   Numbness 10/15/2017   Pelvic fracture (HCC)    Pneumonia    Screening, lipid 01/18/2016   Thrombocytopenia (HCC) 06/27/2014   White matter abnormality on MRI of brain 10/15/2017    Family History  Problem Relation Age of Onset   CAD Mother    Heart attack Mother    Arthritis Mother    Stroke Father    Heart attack Father    CAD Brother    Heart attack Brother     Cancer Brother        prostate cancer   Heart attack Maternal Grandmother    Stroke Maternal Grandmother    Stroke Paternal Grandmother    Past Surgical History:  Procedure Laterality Date   DILITATION & CURRETTAGE/HYSTROSCOPY WITH HYDROTHERMAL ABLATION N/A 09/27/2015   Procedure: DILATATION & CURETTAGE/HYSTEROSCOPY WITH HYDROTHERMAL ABLATION;  Surgeon: Geryl Rankins, MD;  Location: [facility];  Service: Gynecology;  Laterality: N/A;  ultrasound guidance    FOOT SURGERY Right    THYROID RADATION 2016     WISDOM TOOTH EXTRACTION     Social History   Social History Narrative   Married, 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  Constitutional:      Appearance: She is well-developed.  HENT:     Head: Normocephalic and atraumatic.  Eyes:     Conjunctiva/sclera: Conjunctivae normal.  Cardiovascular:     Rate and Rhythm: Normal rate and regular rhythm.     Heart sounds: Normal heart sounds.  Pulmonary:     Effort: Pulmonary effort is normal.     Breath sounds: Normal breath sounds.  Abdominal:     General: Bowel sounds are normal.     Palpations: Abdomen is soft.  Musculoskeletal:     Cervical back: Normal range of motion.  Lymphadenopathy:     Cervical: No cervical adenopathy.  Skin:    General: Skin is warm and dry.     Capillary Refill: Capillary refill takes less than 2 seconds.  Neurological:     Mental Status: She is  alert and oriented to person, place, and time.  Psychiatric:        Behavior: Behavior normal.      Musculoskeletal Exam: Cervical, thoracic and lumbar spine were in good range of motion.  Shoulders, elbows, wrists, MCPs PIPs and DIPs were in good range of motion with no synovitis.  Bilateral PIP and DIP thickening was noted.  Hip joints and knee joints in good range of motion without any warmth swelling or effusion.  She had hypermobility in most of her joints.  CDAI Exam: CDAI Score: -- Patient Global: --; Provider Global: -- Swollen: --; Tender: -- Joint Exam 01/20/2024   No joint exam has been documented for this visit   There is currently no information documented on the homunculus. Go to  the Rheumatology activity and complete the homunculus joint exam.  Investigation: No additional findings.  Imaging: No results found.  Recent Labs: Lab Results  Component Value Date   WBC 4.4 11/12/2023   HGB 14.3 11/12/2023   PLT 254 11/12/2023   NA 141 07/03/2023   K 4.3 07/03/2023   CL 101 07/03/2023   CO2 24 07/03/2023   GLUCOSE 90 07/03/2023   BUN 19 07/03/2023   CREATININE 0.88 07/03/2023   BILITOT <0.2 07/03/2023   ALKPHOS 96 07/03/2023   AST 20 07/03/2023   ALT 9 07/03/2023   PROT 7.1 11/28/2023   ALBUMIN 4.6 07/03/2023   CALCIUM 9.5 07/03/2023   GFRAA >60 04/14/2020   QFTBGOLDPLUS Negative 07/03/2023   November 28, 2023 IFE normal, IgA low at 32, ANA 1: 80 NH, RNP negative, Smith negative, dsDNA negative, C3-C4 normal, RF negative, CK 88, UA negative  07/03/23: Ro 1.3, La negative, IgA 32-low, rest of immunoglobulins WNL, TB gold negative   Speciality Comments: No specialty comments available.  Procedures:  No procedures performed Allergies: Patient has no known allergies.   Assessment / Plan:     Visit Diagnoses: Sjogren's syndrome with other organ involvement (HCC) - Positive ANA, positive SSA, low IgA, dry mouth and dry eyes: -Detail counseled regarding  Sjogren's was provided.  She complains of dry mouth, dry eyes, dry skin and vaginal dryness.  She denies any history of palpitations or shortness of breath.  Association of arrhythmias with Sjogren's was discussed.  Advised her to get a baseline EKG with her PCP.  Association of ILD with Sjogren's was discussed.  She denies any shortness of breath or chronic cough.  I advised her to contact us if she develops any new symptoms.  Increase incidence of of lymphoma and parotid swelling with Sjogren's was discussed.  Over-the-counter products for Sjogren's were discussed at length.  She is already using eyedrops.  I discussed the option of pilocarpine.  Side effects were discussed at length.  A handout was given.  Prescription for pilocarpine 5 mg p.o. to twice daily as needed was sent.  Plan: pilocarpine (SALAGEN) 5 MG tablet  Primary osteoarthritis of both hands - Clinical graphic findings suggestive of osteoarthritis.  X-ray findings were reviewed with the patient.  Joint protection muscle strengthening was discussed.  A handout on hand exercises was given.  Primary osteoarthritis of both feet-proper fitting shoes were advised.  She has bilateral hammertoes and pes cavus.  Hypermobility of joint-isometric exercises were advised.  Other medical problems are listed as follows:  Multiple sclerosis (HCC) - Diagnosed 5 years ago.  Patient is under care of Dr. Epimenio Foot  Cognitive deficit secondary to multiple sclerosis (HCC)  Idiopathic thrombocytopenic purpura (HCC)  Ataxic gait  Essential hypertension  Postoperative hypothyroidism  Attention deficit  History of depression  Stress  Orders: No orders of the defined types were placed in this encounter.  Meds ordered this encounter  Medications   pilocarpine (SALAGEN) 5 MG tablet    Sig: Take 1 tablet (5 mg total) by mouth 2 (two) times daily.    Dispense:  60 tablet    Refill:  2    Face-to-face time spent with patient was 40 minutes.  Greater than 50% of time was spent in counseling and coordination of care.  Follow-Up Instructions: Return in about 3 months (around 04/18/2024) for Sjogren's, Osteoarthritis.   Pollyann Savoy, MD  Note - This record has been created using Animal nutritionist.  Chart creation errors have been sought,  but may not always  have been located. Such creation errors do not reflect on  the standard of medical care.

## 2024-01-08 ENCOUNTER — Ambulatory Visit: Payer: Medicare Other | Admitting: Neurology

## 2024-01-15 DIAGNOSIS — E039 Hypothyroidism, unspecified: Secondary | ICD-10-CM | POA: Diagnosis not present

## 2024-01-20 ENCOUNTER — Ambulatory Visit: Payer: Medicare HMO | Attending: Rheumatology | Admitting: Rheumatology

## 2024-01-20 ENCOUNTER — Encounter: Payer: Self-pay | Admitting: Rheumatology

## 2024-01-20 VITALS — BP 117/84 | HR 84 | Resp 14 | Ht 65.5 in | Wt 160.0 lb

## 2024-01-20 DIAGNOSIS — M19071 Primary osteoarthritis, right ankle and foot: Secondary | ICD-10-CM

## 2024-01-20 DIAGNOSIS — M3509 Sicca syndrome with other organ involvement: Secondary | ICD-10-CM

## 2024-01-20 DIAGNOSIS — I10 Essential (primary) hypertension: Secondary | ICD-10-CM

## 2024-01-20 DIAGNOSIS — D693 Immune thrombocytopenic purpura: Secondary | ICD-10-CM

## 2024-01-20 DIAGNOSIS — E890 Postprocedural hypothyroidism: Secondary | ICD-10-CM

## 2024-01-20 DIAGNOSIS — F439 Reaction to severe stress, unspecified: Secondary | ICD-10-CM

## 2024-01-20 DIAGNOSIS — F09 Unspecified mental disorder due to known physiological condition: Secondary | ICD-10-CM

## 2024-01-20 DIAGNOSIS — R260 Ataxic gait: Secondary | ICD-10-CM | POA: Diagnosis not present

## 2024-01-20 DIAGNOSIS — M19042 Primary osteoarthritis, left hand: Secondary | ICD-10-CM

## 2024-01-20 DIAGNOSIS — M249 Joint derangement, unspecified: Secondary | ICD-10-CM

## 2024-01-20 DIAGNOSIS — G35 Multiple sclerosis: Secondary | ICD-10-CM

## 2024-01-20 DIAGNOSIS — R41840 Attention and concentration deficit: Secondary | ICD-10-CM

## 2024-01-20 DIAGNOSIS — M19041 Primary osteoarthritis, right hand: Secondary | ICD-10-CM | POA: Diagnosis not present

## 2024-01-20 DIAGNOSIS — Z8659 Personal history of other mental and behavioral disorders: Secondary | ICD-10-CM

## 2024-01-20 DIAGNOSIS — M19072 Primary osteoarthritis, left ankle and foot: Secondary | ICD-10-CM

## 2024-01-20 MED ORDER — PILOCARPINE HCL 5 MG PO TABS: 5.0000 mg | ORAL_TABLET | Freq: Two times a day (BID) | ORAL | 2 refills | Status: DC

## 2024-01-20 NOTE — Addendum Note (Signed)
Addended by: Henriette Combs on: 01/20/2024 10:35 AM   Modules accepted: Orders

## 2024-01-20 NOTE — Patient Instructions (Addendum)
Sjogren's Syndrome Sjgren's syndrome is an inflammatory disease in which the body's disease-fighting system (immune system) attacks the glands that produce tears (lacrimal glands) and the glands that produce saliva (salivary glands). This makes the eyes and mouth very dry. Sjgren's syndrome can also affect other parts of the body, causing dryness of the skin, nose, throat, and vagina. Sjgren's syndrome is a long-term (chronic) disorder that has no cure. In some cases, it is linked to other disorders (rheumatic disorders), such as rheumatoid arthritis and systemic lupus erythematosus (SLE). It may affect other parts of the body, such as the: Blood vessels. Joints. Lungs. Kidneys. Liver or pancreas. Brain, nerves, or spinal cord. What are the causes? The cause of this condition is not known. It may be passed along from parent to child (inherited), or it may be a symptom of a rheumatic disorder. What increases the risk? This condition is more likely to develop in: Women. People who are 23-64 years old and older. People who have recently had a viral infection or currently have a viral infection. What are the signs or symptoms? The main symptoms of this condition are: Dry mouth. This may include: A chalky feeling. Difficulty swallowing, speaking, or tasting. Frequent cavities in the teeth. Frequent mouth infections. Dry eyes. This may include: Burning, redness, and itching. Blurry vision. Fluctuating vision. Light sensitivity. Other symptoms may include: Dryness of the skin and the inside of the nose. Eyelid infections. Vaginal dryness (if applicable). Joint pain and stiffness. Muscle pain and stiffness. How is this diagnosed? This condition is diagnosed based on: Your symptoms. Your medical history. A physical exam of your eyes and mouth. Tests, including: A Schirmer test. This tests your tear production. An eye exam that is done with a magnifying device (slit-lamp exam). An  eye test that temporarily stains your eye with special dyes. This shows the extent of eye damage. Tests to check your salivary gland function. Biopsy. This is a removal of part of a salivary gland from inside your lower lip to be studied under a microscope. Chest X-rays. Blood or urine tests. How is this treated? There is no cure for this condition, but treatment can help you manage your symptoms. You may be asked to see a rheumatologist for further evaluation and treatment. This condition may be treated with: Medicines to help relieve pain and stiffness. Medicines to help relieve inflammation in your body (corticosteroids). These are usually for severe cases. Medicines to help reduce the activity of your immune system (immunosuppressants). These are usually prescribed by your health care provider or a rheumatologist. Moisture replacement therapies to help relieve dryness in your skin, mouth, and eyes. Dry eyes may be treated with: Eye drops or nasal sprays to improve dryness of the eyes. Surgery or insertion of plugs to close the lacrimal glands (punctal occlusion). This helps keep more natural tears in your eyes. Soft contact lenses or hard scleral lenses. These are occasionally used to protect the surface of the eye. Biologic lubricating eye drops (serum tears). These are eye drops made from a person's own blood. They are used in some people with severe dry eye. Follow these instructions at home: Eye care  Use eye drops and other medicines as told by your health care provider. Protect your eyes from the sun and wind with sunglasses or glasses. Blink at least 5-6 times a minute. Maintain properly humidified air. You may want to use a humidifier at home and at work. Avoid smoke. Mouth care Brush your teeth and floss  after every meal. Chew sugar-free gum or suck on hard candy. This may help to relieve dry mouth. Use antimicrobial mouthwash daily. Take frequent sips of water or sugar-free  drinks. Use saliva substitutes or lip balm as told by your health care provider. See your dentist every 6 months. General instructions  Take over-the-counter and prescription medicines only as told by your health care provider. Drink enough fluid to keep your urine pale yellow. Keep all follow-up visits. This is important. Contact a health care provider if: You have a fever. You have night sweats. You are always tired. You have unexplained weight loss. You develop itchy skin. You have red patches on your skin. You have a lump or swelling on your neck. Get help right away if: You develop severe eye pain. You develop sudden decreased vision. Summary Sjgren's syndrome is a disease in which the body's immune system attacks the glands that produce tears and the glands that produce saliva. This condition makes the eyes and mouth very dry. Sjgren's syndrome is a long-term (chronic) disorder. There is no cure for this condition, but treatment can help you manage your symptoms. The cause of this condition is not known. You may be asked to see a rheumatologist for further evaluation and treatment. This information is not intended to replace advice given to you by your health care provider. Make sure you discuss any questions you have with your health care provider. Document Revised: 06/25/2021 Document Reviewed: 06/25/2021 Elsevier Patient Education  2024 [employer]  Pilocarpine Tablets What is this medication? PILOCARPINE (PYE loe KAR peen) treats dry mouth. It works by increasing the amount of saliva in the mouth, which makes it easier to speak and swallow. This medicine may be used for other purposes; ask your health care provider or pharmacist if you have questions. COMMON BRAND NAME(S): Salagen What should I tell my care team before I take this medication? They need to know if you have any of these conditions: Eye infection or other eye problems Glaucoma Heart disease Liver  disease Lung or breathing disease, such as asthma An unusual or allergic reaction to pilocarpine, other medications, foods, dyes, or preservatives Pregnant or trying to get pregnant Breast-feeding How should I use this medication? Take this medication by mouth with a full glass of water. Take it as directed on the prescription label at the same time every day. Keep taking it unless your care team tells you to stop. Talk to your care team about the use of this medication in children. Special care may be needed. Overdosage: If you think you have taken too much of this medicine contact a poison control center or emergency room at once. NOTE: This medicine is only for you. Do not share this medicine with others. What if I miss a dose? If you miss a dose, take it as soon as you can. If it is almost time for your next dose, take only that dose. Do not take double or extra doses. What may interact with this medication? Antihistamines for allergy, cough, and cold Atropine Certain medications for Alzheimer disease, such as donepezil, galantamine, rivastigmine Certain medications for bladder problems, such as bethanechol, oxybutynin, tolterodine Certain medications for Parkinson disease, such as benztropine, trihexyphenidyl Certain medications for quitting smoking, such as nicotine Certain medications for stomach problems, such as dicyclomine, hyoscyamine Certain medications for travel sickness, such as scopolamine Ipratropium Medications for blood pressure or heart problems, such as metoprolol This list may not describe all possible interactions. Give your health  care provider a list of all the medicines, herbs, non-prescription drugs, or dietary supplements you use. Also tell them if you smoke, drink alcohol, or use illegal drugs. Some items may interact with your medicine. What should I watch for while using this medication? Visit your care team for regular checks on your progress. Tell your care  team if your symptoms do not get better or if they get worse. You may get blurry vision or have trouble telling how far something is from you. This may be a problem at night or when the lights are low. Do not drive, use machinery, or do anything that needs clear vision until you know how this medication affects you. If you sweat a lot, drink enough to replace fluids. Do not get dehydrated. What side effects may I notice from receiving this medication? Side effects that you should report to your care team as soon as possible: Allergic reactions--skin rash, itching, hives, swelling of the face, lips, tongue, or throat Fast or irregular heartbeat Increase in blood pressure Low blood pressure--dizziness, feeling faint or lightheaded, blurry vision Slow heartbeat--dizziness, feeling faint or lightheaded, confusion, trouble breathing, unusual weakness or fatigue Side effects that usually do not require medical attention (report to your care team if they continue or are bothersome): Change in vision Chills Diarrhea Excessive sweating Flushing Headache Increased need to urinate This list may not describe all possible side effects. Call your doctor for medical advice about side effects. You may report side effects to FDA at 1-800-FDA-1088. Where should I keep my medication? Keep out of the reach of children and pets. Store at room temperature between 15 and 30 degrees C (59 and 86 degrees F). Get rid of any unused medication after the expiration date. To get rid of medications that are no longer needed or have expired: Take the medications to a medication take-back program. Check with your pharmacy or law enforcement to find a location. If your cannot return the medication, check the label or package insert to see if the medication should be thrown out in the garbage or flushed down the toilet. If you are not sure, ask your care team. If it is safe to put it in the trash, take the medication out of  the container. Mix the medication with cat litter, dirt, coffee grounds, or other unwanted substance. Seal the mixture in a bag or container. Put it in the trash. NOTE: This sheet is a summary. It may not cover all possible information. If you have questions about this medicine, talk to your doctor, pharmacist, or health care provider.  2024 Elsevier/Gold Standard (2021-11-09 00:00:00)  Hand Exercises Hand exercises can be helpful for almost anyone. They can strengthen your hands and improve flexibility and movement. The exercises can also increase blood flow to the hands. These results can make your work and daily tasks easier for you. Hand exercises can be especially helpful for people who have joint pain from arthritis or nerve damage from using their hands over and over. These exercises can also help people who injure a hand. Exercises Most of these hand exercises are gentle stretching and motion exercises. It is usually safe to do them often throughout the day. Warming up your hands before exercise may help reduce stiffness. You can do this with gentle massage or by placing your hands in warm water for 10-15 minutes. It is normal to feel some stretching, pulling, tightness, or mild discomfort when you begin new exercises. In time, this will improve. Remember  to always be careful and stop right away if you feel sudden, very bad pain or your pain gets worse. You want to get better and be safe. Ask your health care provider which exercises are safe for you. Do exercises exactly as told by your provider and adjust them as told. Do not begin these exercises until told by your provider. Knuckle bend or "claw" fist  Stand or sit with your arm, hand, and all five fingers pointed straight up. Make sure to keep your wrist straight. Gently bend your fingers down toward your palm until the tips of your fingers are touching your palm. Keep your big knuckle straight and only bend the small knuckles in your  fingers. Hold this position for 10 seconds. Straighten your fingers back to your starting position. Repeat this exercise 5-10 times with each hand. Full finger fist  Stand or sit with your arm, hand, and all five fingers pointed straight up. Make sure to keep your wrist straight. Gently bend your fingers into your palm until the tips of your fingers are touching the middle of your palm. Hold this position for 10 seconds. Extend your fingers back to your starting position, stretching every joint fully. Repeat this exercise 5-10 times with each hand. Straight fist  Stand or sit with your arm, hand, and all five fingers pointed straight up. Make sure to keep your wrist straight. Gently bend your fingers at the big knuckle, where your fingers meet your hand, and at the middle knuckle. Keep the knuckle at the tips of your fingers straight and try to touch the bottom of your palm. Hold this position for 10 seconds. Extend your fingers back to your starting position, stretching every joint fully. Repeat this exercise 5-10 times with each hand. Tabletop  Stand or sit with your arm, hand, and all five fingers pointed straight up. Make sure to keep your wrist straight. Gently bend your fingers at the big knuckle, where your fingers meet your hand, as far down as you can. Keep the small knuckles in your fingers straight. Think of forming a tabletop with your fingers. Hold this position for 10 seconds. Extend your fingers back to your starting position, stretching every joint fully. Repeat this exercise 5-10 times with each hand. Finger spread  Place your hand flat on a table with your palm facing down. Make sure your wrist stays straight. Spread your fingers and thumb apart from each other as far as you can until you feel a gentle stretch. Hold this position for 10 seconds. Bring your fingers and thumb tight together again. Hold this position for 10 seconds. Repeat this exercise 5-10 times with  each hand. Making circles  Stand or sit with your arm, hand, and all five fingers pointed straight up. Make sure to keep your wrist straight. Make a circle by touching the tip of your thumb to the tip of your index finger. Hold for 10 seconds. Then open your hand wide. Repeat this motion with your thumb and each of your fingers. Repeat this exercise 5-10 times with each hand. Thumb motion  Sit with your forearm resting on a table and your wrist straight. Your thumb should be facing up toward the ceiling. Keep your fingers relaxed as you move your thumb. Lift your thumb up as high as you can toward the ceiling. Hold for 10 seconds. Bend your thumb across your palm as far as you can, reaching the tip of your thumb for the small finger (pinkie) side of  your palm. Hold for 10 seconds. Repeat this exercise 5-10 times with each hand. Grip strengthening  Hold a stress ball or other soft ball in the middle of your hand. Slowly increase the pressure, squeezing the ball as much as you can without causing pain. Think of bringing the tips of your fingers into the middle of your palm. All of your finger joints should bend when doing this exercise. Hold your squeeze for 10 seconds, then relax. Repeat this exercise 5-10 times with each hand. Contact a health care provider if: Your hand pain or discomfort gets much worse when you do an exercise. Your hand pain or discomfort does not improve within 2 hours after you exercise. If you have either of these problems, stop doing these exercises right away. Do not do them again unless your provider says that you can. Get help right away if: You develop sudden, severe hand pain or swelling. If this happens, stop doing these exercises right away. Do not do them again unless your provider says that you can. This information is not intended to replace advice given to you by your health care provider. Make sure you discuss any questions you have with your health care  provider. Document Revised: 12/10/2022 Document Reviewed: 12/10/2022 Elsevier Patient Education  2024 Elsevier Inc.  Osteoarthritis  Osteoarthritis is a type of arthritis. It refers to joint pain or joint disease. Osteoarthritis affects tissue that covers the ends of bones in joints (cartilage). Cartilage acts as a cushion between the bones and helps them move smoothly. Osteoarthritis occurs when cartilage in the joints gets worn down. Osteoarthritis is sometimes called "wear and tear" arthritis. Osteoarthritis is the most common form of arthritis. It often occurs in older people. It is a condition that gets worse over time. The joints most often affected by this condition are in the fingers, toes, hips, knees, and spine, including the neck and lower back. What are the causes? This condition is caused by the wearing down of cartilage that covers the ends of bones. What increases the risk? The following factors may make you more likely to develop this condition: Being age 19 or older. Obesity. Overuse of joints. Past injury of a joint. Past surgery on a joint. Family history of osteoarthritis. What are the signs or symptoms? The main symptoms of this condition are pain, swelling, and stiffness in the joint. Other symptoms may include: An enlarged joint. More pain and further damage caused by small pieces of bone or cartilage that break off and float inside of the joint. Small deposits of bone (osteophytes) that grow on the edges of the joint. A grating or scraping feeling inside the joint when you move it. Popping or creaking sounds when you move. Difficulty walking or exercising. An inability to grip items, twist your hand, or control the movements of your hands and fingers. How is this diagnosed? This condition may be diagnosed based on: Your medical history. A physical exam. Your symptoms. X-rays of the affected joints. Blood tests to rule out other types of arthritis. How is  this treated? There is no cure for this condition, but treatment can help control pain and improve joint function. Treatment may include a combination of therapies, such as: Pain relief techniques, such as: Applying heat and cold to the joint. Massage. A form of talk therapy called cognitive behavioral therapy (CBT). This therapy helps you set goals and follow up on the changes that you make. Medicines for pain and inflammation. The medicines can be  taken by mouth or applied to the skin. They include: NSAIDs, such as ibuprofen. Prescription medicines. Strong anti-inflammatory medicines (corticosteroids). Certain nutritional supplements. A prescribed exercise program. You may work with a physical therapist. Assistive devices, such as a brace, wrap, splint, specialized glove, or cane. A weight control plan. Surgery, such as: An osteotomy. This is done to reposition the bones and relieve pain or to remove loose pieces of bone and cartilage. Joint replacement surgery. You may need this surgery if you have advanced osteoarthritis. Follow these instructions at home: Activity Rest your affected joints as told by your health care provider. Exercise as told by your provider. The provider may recommend specific types of exercise, such as: Strengthening exercises. These are done to strengthen the muscles that support joints affected by arthritis. Aerobic activities. These are exercises, such as brisk walking or water aerobics, that increase your heart rate. Range-of-motion activities. These help your joints move more easily. Balance and agility exercises. Managing pain, stiffness, and swelling     If told, apply heat to the affected area as often as told by your provider. Use the heat source that your provider recommends, such as a moist heat pack or a heating pad. If you have a removable assistive device, remove it as told by your provider. Place a towel between your skin and the heat source. If  your provider tells you to keep the assistive device on while you apply heat, place a towel between the assistive device and the heat source. Leave the heat on for 20-30 minutes. If told, put ice on the affected area. If you have a removable assistive device, remove it as told by your provider. Put ice in a plastic bag. Place a towel between your skin and the bag. If your provider tells you to keep the assistive device on during icing, place a towel between the assistive device and the bag. Leave the ice on for 20 minutes, 2-3 times a day. If your skin turns bright red, remove the ice or heat right away to prevent skin damage. The risk of damage is higher if you cannot feel pain, heat, or cold. Move your fingers or toes often to reduce stiffness and swelling. Raise (elevate) the affected area above the level of your heart while you are sitting or lying down. General instructions Take over-the-counter and prescription medicines only as told by your provider. Maintain a healthy weight. Follow instructions from your provider for weight control. Do not use any products that contain nicotine or tobacco. These products include cigarettes, chewing tobacco, and vaping devices, such as e-cigarettes. If you need help quitting, ask your provider. Use assistive devices as told by your provider. Where to find more information [employer] of Arthritis and Musculoskeletal and Skin Diseases: niams.http://www.myers.net/ [employer] on Aging: BaseRingTones.pl American College of Rheumatology: rheumatology.org Contact a health care provider if: You have redness, swelling, or a feeling of warmth in a joint that gets worse. You have a fever along with joint or muscle aches. You develop a rash. You have trouble doing your normal activities. You have pain that gets worse and is not relieved by pain medicine. This information is not intended to replace advice given to you by your health care provider. Make sure you  discuss any questions you have with your health care provider. Document Revised: 07/25/2022 Document Reviewed: 07/25/2022 Elsevier Patient Education  2024 [employer].

## 2024-01-21 ENCOUNTER — Ambulatory Visit
Admission: RE | Admit: 2024-01-21 | Discharge: 2024-01-21 | Disposition: A | Discharge status: Home / Self Care | Payer: Medicare HMO | Source: Ambulatory Visit | Attending: Family Medicine | Admitting: Family Medicine

## 2024-01-21 DIAGNOSIS — Z1231 Encounter for screening mammogram for malignant neoplasm of breast: Secondary | ICD-10-CM | POA: Diagnosis not present

## 2024-02-24 DIAGNOSIS — E890 Postprocedural hypothyroidism: Secondary | ICD-10-CM | POA: Diagnosis not present

## 2024-02-24 DIAGNOSIS — E0500 Thyrotoxicosis with diffuse goiter without thyrotoxic crisis or storm: Secondary | ICD-10-CM | POA: Diagnosis not present

## 2024-03-05 DIAGNOSIS — E039 Hypothyroidism, unspecified: Secondary | ICD-10-CM | POA: Diagnosis not present

## 2024-03-10 DIAGNOSIS — H04123 Dry eye syndrome of bilateral lacrimal glands: Secondary | ICD-10-CM | POA: Diagnosis not present

## 2024-03-10 DIAGNOSIS — H2513 Age-related nuclear cataract, bilateral: Secondary | ICD-10-CM | POA: Diagnosis not present

## 2024-04-01 ENCOUNTER — Other Ambulatory Visit: Payer: Self-pay | Admitting: *Deleted

## 2024-04-01 DIAGNOSIS — M3509 Sicca syndrome with other organ involvement: Secondary | ICD-10-CM

## 2024-04-01 MED ORDER — PILOCARPINE HCL 5 MG PO TABS: 5.0000 mg | ORAL_TABLET | Freq: Two times a day (BID) | ORAL | 0 refills | Status: AC

## 2024-04-01 NOTE — Telephone Encounter (Signed)
 Refill request received via fax from [facility] for Pilocarpine .  Last Fill: 01/20/2024  Next Visit: 05/11/2024  Last Visit: 01/20/2024  Dx: Sjogren's syndrome with other organ involvement   Current Dose per office note on 01/20/2024: pilocarpine  5 mg p.o. to twice daily as needed   Okay to refill Pilocarpine ?

## 2024-04-16 ENCOUNTER — Other Ambulatory Visit: Payer: Self-pay | Admitting: Rheumatology

## 2024-04-16 DIAGNOSIS — M3509 Sicca syndrome with other organ involvement: Secondary | ICD-10-CM

## 2024-04-27 NOTE — Progress Notes (Signed)
 Office Visit Note  Patient: Ashley Key             Date of Birth: [birth year]           MRN: [id number]             PCP: Ronna Coho, MD Referring: Ronna Coho, MD Visit Date: 05/11/2024 Occupation: @GUAROCC @  Subjective:  Pain in hands  History of Present Illness: Ashley Key is a 58 y.o. female with Sjogren's and osteoarthritis.  She continues to have dry eyes.  She has been using over-the-counter eyedrops.  She states dry mouth and dry skin symptoms are manageable.  She did not get pilocarpine .  She denies shortness of breath, palpitations or lymphadenopathy.  Has off-and-on discomfort in her hands.  None of the other joints are painful.    Activities of Daily Living:  Patient reports morning stiffness for  none.   Patient Denies nocturnal pain.  Difficulty dressing/grooming: Denies Difficulty climbing stairs: Denies Difficulty getting out of chair: Denies Difficulty using hands for taps, buttons, cutlery, and/or writing: Reports  Review of Systems  Constitutional:  Positive for fatigue.  HENT:  Negative for mouth sores and mouth dryness.   Eyes:  Positive for dryness.  Respiratory:  Negative for shortness of breath.   Cardiovascular:  Negative for chest pain and palpitations.  Gastrointestinal:  Positive for constipation. Negative for blood in stool and diarrhea.  Endocrine: Positive for increased urination.  Genitourinary:  Negative for involuntary urination.  Musculoskeletal:  Positive for joint pain, gait problem and joint pain. Negative for joint swelling, myalgias, muscle weakness, morning stiffness, muscle tenderness and myalgias.  Skin:  Negative for color change, rash, hair loss and sensitivity to sunlight.  Allergic/Immunologic: Negative for susceptible to infections.  Neurological:  Negative for dizziness and headaches.  Hematological:  Negative for swollen glands.  Psychiatric/Behavioral:  Positive for sleep disturbance. Negative for depressed mood. The  patient is not nervous/anxious.     PMFS History:  Patient Active Problem List   Diagnosis Date Noted   CAP vs BOOP 05/08/2023   Upper airway cough syndrome 05/07/2023   Depression 12/11/2021   Diplopia 08/10/2019   Cognitive deficit secondary to multiple sclerosis (HCC) 01/13/2018   Multiple sclerosis (HCC) 10/23/2017   High risk medication use 10/23/2017   White matter abnormality on MRI of brain 10/15/2017   Numbness 10/15/2017   Memory loss 10/15/2017   Ataxic gait 10/15/2017   Attention deficit 10/15/2017   Chest pain, unspecified 01/18/2016   Screening, lipid 01/18/2016   Essential hypertension 01/18/2016   Hypothyroidism 01/18/2016   Idiopathic thrombocytopenic purpura (HCC) 06/27/2014   Thrombocytopenia (HCC) 06/27/2014    Past Medical History:  Diagnosis Date   ADHD (attention deficit hyperactivity disorder)    Arch pain    Arthritis    FEET   Ataxic gait 10/15/2017   Attention deficit 10/15/2017   Blood dyscrasia    NORMAL PLATLETS 50-70'S   Bunion    Chest pain, unspecified 01/18/2016   Cognitive deficit secondary to multiple sclerosis (HCC) 01/13/2018   Diplopia 08/10/2019   Essential hypertension 01/18/2016   Graves disease    Headache    HISTORY MIGRAINE   Hypertension    Hypothyroidism 01/18/2016   Idiopathic thrombocytopenic purpura (HCC) 06/27/2014   ITP (idiopathic thrombocytopenic purpura)    Memory loss 10/15/2017   Multiple sclerosis (HCC) 10/23/2017   Numbness 10/15/2017   Pelvic fracture (HCC)    Pneumonia    Screening, lipid 01/18/2016  Thrombocytopenia (HCC) 06/27/2014   White matter abnormality on MRI of brain 10/15/2017    Family History  Problem Relation Age of Onset   CAD Mother    Heart attack Mother    Arthritis Mother    Stroke Father    Heart attack Father    Heart attack Maternal Grandmother    Stroke Maternal Grandmother    Stroke Paternal Grandmother    CAD Brother    Heart attack Brother    Cancer Brother        prostate cancer    BRCA 1/2 Neg Hx    Breast cancer Neg Hx    Past Surgical History:  Procedure Laterality Date   DILITATION & CURRETTAGE/HYSTROSCOPY WITH HYDROTHERMAL ABLATION N/A 09/27/2015   Procedure: DILATATION & CURETTAGE/HYSTEROSCOPY WITH HYDROTHERMAL ABLATION;  Surgeon: Johnn Najjar, MD;  Location: [facility];  Service: Gynecology;  Laterality: N/A;  ultrasound guidance    FOOT SURGERY Right    THYROID  RADATION 2016     WISDOM TOOTH EXTRACTION     Social History   Social History Narrative   Married, 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  Constitutional:      Appearance: She is well-developed.  HENT:     Head: Normocephalic and atraumatic.  Eyes:     Conjunctiva/sclera: Conjunctivae normal.  Cardiovascular:     Rate and Rhythm: Normal rate and regular rhythm.     Heart sounds: Normal heart sounds.  Pulmonary:     Effort: Pulmonary effort is normal.     Breath sounds: Normal breath sounds.  Abdominal:     General: Bowel sounds are normal.     Palpations: Abdomen is soft.  Musculoskeletal:     Cervical back: Normal range of motion.  Lymphadenopathy:     Cervical: No cervical adenopathy.  Skin:    General: Skin is warm and dry.     Capillary Refill: Capillary refill takes less than 2 seconds.  Neurological:     Mental Status: She is alert and oriented to person, place, and time.  Psychiatric:        Behavior: Behavior normal.       Musculoskeletal Exam: Cervical, thoracic and lumbar spine were in good range of motion.  Shoulders, elbows, wrist joints in good range of motion.  She had bilateral CMC thickening, PIP and DIP thickening with no synovitis.  Hip joints, knee joints, ankles, MTPs and PIPs in good range of motion with no synovitis.  CDAI Exam: CDAI Score: -- Patient Global: --; Provider Global: -- Swollen: --; Tender: -- Joint Exam 05/11/2024   No joint exam has been documented for this visit   There is currently no information documented on the homunculus. Go to the Rheumatology activity and complete the homunculus joint exam.  Investigation: No additional findings.  Imaging: No results found.  Recent Labs:  Lab Results  Component Value Date   WBC 4.4 11/12/2023   HGB 14.3 11/12/2023   PLT 254 11/12/2023   NA 141 07/03/2023   K 4.3 07/03/2023   CL 101 07/03/2023   CO2 24 07/03/2023   GLUCOSE 90 07/03/2023   BUN 19 07/03/2023   CREATININE 0.88 07/03/2023   BILITOT <0.2 07/03/2023   ALKPHOS 96 07/03/2023   AST 20 07/03/2023   ALT 9 07/03/2023   PROT 7.1 11/28/2023   ALBUMIN 4.6 07/03/2023   CALCIUM  9.5 07/03/2023   GFRAA >60 04/14/2020   QFTBGOLDPLUS Negative 07/03/2023    Speciality Comments: No specialty comments available.  Procedures:  No procedures performed Allergies: Patient has no known allergies.   Assessment / Plan:     Visit Diagnoses: Sjogren's syndrome with other organ involvement (HCC) - Positive ANA, positive SSA, low IgA, dry mouth and dry eyes: Patient states that she continues to have dry eyes for which she has been using over-the-counter products.  She has not been using any thing for dry mouth and her dry mouth symptoms are manageable.  She has been using topical moisturizers for dry skin.  She decided not to get the prescription for pilocarpine .  Over-the-counter products were discussed again.  She denies history of palpitations, shortness of breath or  lymphadenopathy.  Increased risk of ILD, arrhythmias and lymphoma with Sjogren's was discussed.  Patient will notify us  if she develops any new symptoms.  She was advised to get labs prior to her next visit in 6 months.  Primary osteoarthritis of both hands - Clinical graphic findings suggestive of osteoarthritis.  She continues to have discomfort in her hands especially over the left [facility] joint.  A left CMC brace was advised but she declined.  She had bilateral CMC, PIP and DIP thickening.  A handout on joint muscle strength exercises was given.  Joint protection was discussed.  Primary osteoarthritis of both feet - She has bilateral hammertoes and pes cavus.  She denies any discomfort today.  Neck stiffness-she has been spearing seeing some stiffness in her neck.  She states she goes for aerobics classes on a regular basis.  She has stiffness with right lateral rotation of the cervical spine.  A handout on neck exercises was given.  Hypermobility of joint-patient states she always had hypermobility of her joints.  She also was a Biochemist, clinical.  She has been going for aerobic exercises.  Multiple sclerosis (HCC) - Diagnosed 5 years ago.  Patient is under care of Dr. Godwin Lat  Cognitive deficit secondary to multiple sclerosis (HCC)  Idiopathic thrombocytopenic purpura (HCC)  Ataxic gait  Postoperative hypothyroidism  Essential hypertension  History of depression  Attention deficit  Stress  Orders: Orders Placed This Encounter  Procedures   Protein / creatinine ratio, urine   CBC with Differential/Platelet   Comprehensive metabolic panel with GFR   ANA   Anti-DNA antibody, double-stranded   C3 and C4   Sedimentation rate   Sjogrens syndrome-A extractable nuclear antibody   No orders of the defined types were placed in this encounter.    Follow-Up Instructions: Return in about 6 months (around 11/10/2024) for Sjogren's, Osteoarthritis.   Nicholas Bari, MD  Note - This  record has been created using Animal nutritionist.  Chart creation errors have been sought, but may not always  have been located. Such creation errors do not reflect on  the standard of medical care.

## 2024-05-11 ENCOUNTER — Encounter: Payer: Self-pay | Admitting: Rheumatology

## 2024-05-11 ENCOUNTER — Ambulatory Visit: Payer: Medicare HMO | Attending: Rheumatology | Admitting: Rheumatology

## 2024-05-11 VITALS — BP 118/70 | HR 79 | Resp 16 | Ht 65.5 in | Wt 163.0 lb

## 2024-05-11 DIAGNOSIS — M249 Joint derangement, unspecified: Secondary | ICD-10-CM | POA: Diagnosis not present

## 2024-05-11 DIAGNOSIS — M19042 Primary osteoarthritis, left hand: Secondary | ICD-10-CM

## 2024-05-11 DIAGNOSIS — R41840 Attention and concentration deficit: Secondary | ICD-10-CM

## 2024-05-11 DIAGNOSIS — F439 Reaction to severe stress, unspecified: Secondary | ICD-10-CM

## 2024-05-11 DIAGNOSIS — E890 Postprocedural hypothyroidism: Secondary | ICD-10-CM

## 2024-05-11 DIAGNOSIS — M19041 Primary osteoarthritis, right hand: Secondary | ICD-10-CM

## 2024-05-11 DIAGNOSIS — R260 Ataxic gait: Secondary | ICD-10-CM | POA: Diagnosis not present

## 2024-05-11 DIAGNOSIS — M436 Torticollis: Secondary | ICD-10-CM

## 2024-05-11 DIAGNOSIS — M19071 Primary osteoarthritis, right ankle and foot: Secondary | ICD-10-CM | POA: Diagnosis not present

## 2024-05-11 DIAGNOSIS — M3509 Sicca syndrome with other organ involvement: Secondary | ICD-10-CM | POA: Diagnosis not present

## 2024-05-11 DIAGNOSIS — D693 Immune thrombocytopenic purpura: Secondary | ICD-10-CM | POA: Diagnosis not present

## 2024-05-11 DIAGNOSIS — Z8659 Personal history of other mental and behavioral disorders: Secondary | ICD-10-CM

## 2024-05-11 DIAGNOSIS — G35 Multiple sclerosis: Secondary | ICD-10-CM | POA: Diagnosis not present

## 2024-05-11 DIAGNOSIS — I10 Essential (primary) hypertension: Secondary | ICD-10-CM

## 2024-05-11 DIAGNOSIS — G35D Multiple sclerosis, unspecified: Secondary | ICD-10-CM

## 2024-05-11 DIAGNOSIS — M19072 Primary osteoarthritis, left ankle and foot: Secondary | ICD-10-CM

## 2024-05-11 DIAGNOSIS — F09 Unspecified mental disorder due to known physiological condition: Secondary | ICD-10-CM

## 2024-05-11 NOTE — Patient Instructions (Addendum)
 Hand Exercises Hand exercises can be helpful for almost anyone. They can strengthen your hands and improve flexibility and movement. The exercises can also increase blood flow to the hands. These results can make your work and daily tasks easier for you. Hand exercises can be especially helpful for people who have joint pain from arthritis or nerve damage from using their hands over and over. These exercises can also help people who injure a hand. Exercises Most of these hand exercises are gentle stretching and motion exercises. It is usually safe to do them often throughout the day. Warming up your hands before exercise may help reduce stiffness. You can do this with gentle massage or by placing your hands in warm water for 10-15 minutes. It is normal to feel some stretching, pulling, tightness, or mild discomfort when you begin new exercises. In time, this will improve. Remember to always be careful and stop right away if you feel sudden, very bad pain or your pain gets worse. You want to get better and be safe. Ask your health care provider which exercises are safe for you. Do exercises exactly as told by your provider and adjust them as told. Do not begin these exercises until told by your provider. Knuckle bend or "claw" fist  Stand or sit with your arm, hand, and all five fingers pointed straight up. Make sure to keep your wrist straight. Gently bend your fingers down toward your palm until the tips of your fingers are touching your palm. Keep your big knuckle straight and only bend the small knuckles in your fingers. Hold this position for 10 seconds. Straighten your fingers back to your starting position. Repeat this exercise 5-10 times with each hand. Full finger fist  Stand or sit with your arm, hand, and all five fingers pointed straight up. Make sure to keep your wrist straight. Gently bend your fingers into your palm until the tips of your fingers are touching the middle of your  palm. Hold this position for 10 seconds. Extend your fingers back to your starting position, stretching every joint fully. Repeat this exercise 5-10 times with each hand. Straight fist  Stand or sit with your arm, hand, and all five fingers pointed straight up. Make sure to keep your wrist straight. Gently bend your fingers at the big knuckle, where your fingers meet your hand, and at the middle knuckle. Keep the knuckle at the tips of your fingers straight and try to touch the bottom of your palm. Hold this position for 10 seconds. Extend your fingers back to your starting position, stretching every joint fully. Repeat this exercise 5-10 times with each hand. Tabletop  Stand or sit with your arm, hand, and all five fingers pointed straight up. Make sure to keep your wrist straight. Gently bend your fingers at the big knuckle, where your fingers meet your hand, as far down as you can. Keep the small knuckles in your fingers straight. Think of forming a tabletop with your fingers. Hold this position for 10 seconds. Extend your fingers back to your starting position, stretching every joint fully. Repeat this exercise 5-10 times with each hand. Finger spread  Place your hand flat on a table with your palm facing down. Make sure your wrist stays straight. Spread your fingers and thumb apart from each other as far as you can until you feel a gentle stretch. Hold this position for 10 seconds. Bring your fingers and thumb tight together again. Hold this position for 10 seconds. Repeat  this exercise 5-10 times with each hand. Making circles  Stand or sit with your arm, hand, and all five fingers pointed straight up. Make sure to keep your wrist straight. Make a circle by touching the tip of your thumb to the tip of your index finger. Hold for 10 seconds. Then open your hand wide. Repeat this motion with your thumb and each of your fingers. Repeat this exercise 5-10 times with each hand. Thumb  motion  Sit with your forearm resting on a table and your wrist straight. Your thumb should be facing up toward the ceiling. Keep your fingers relaxed as you move your thumb. Lift your thumb up as high as you can toward the ceiling. Hold for 10 seconds. Bend your thumb across your palm as far as you can, reaching the tip of your thumb for the small finger (pinkie) side of your palm. Hold for 10 seconds. Repeat this exercise 5-10 times with each hand. Grip strengthening  Hold a stress ball or other soft ball in the middle of your hand. Slowly increase the pressure, squeezing the ball as much as you can without causing pain. Think of bringing the tips of your fingers into the middle of your palm. All of your finger joints should bend when doing this exercise. Hold your squeeze for 10 seconds, then relax. Repeat this exercise 5-10 times with each hand. Contact a health care provider if: Your hand pain or discomfort gets much worse when you do an exercise. Your hand pain or discomfort does not improve within 2 hours after you exercise. If you have either of these problems, stop doing these exercises right away. Do not do them again unless your provider says that you can. Get help right away if: You develop sudden, severe hand pain or swelling. If this happens, stop doing these exercises right away. Do not do them again unless your provider says that you can. This information is not intended to replace advice given to you by your health care provider. Make sure you discuss any questions you have with your health care provider. Document Revised: 12/10/2022 Document Reviewed: 12/10/2022 Elsevier Patient Education  2024 [employer] Cervical Strain and Sprain Rehab Ask your health care provider which exercises are safe for you. Do exercises exactly as told by your health care provider and adjust them as directed. It is normal to feel mild stretching, pulling, tightness, or discomfort as you do these  exercises. Stop right away if you feel sudden pain or your pain gets worse. Do not begin these exercises until told by your health care provider. Stretching and range-of-motion exercises Cervical side bending  Using good posture, sit on a stable chair or stand up. Without moving your shoulders, slowly tilt your left / right ear to your shoulder until you feel a stretch in the neck muscles on the opposite side. You should be looking straight ahead. Hold for __________ seconds. Repeat with the other side of your neck. Repeat __________ times. Complete this exercise __________ times a day. Cervical rotation  Using good posture, sit on a stable chair or stand up. Slowly turn your head to the side as if you are looking over your left / right shoulder. Keep your eyes level with the ground. Stop when you feel a stretch along the side and the back of your neck. Hold for __________ seconds. Repeat this by turning to your other side. Repeat __________ times. Complete this exercise __________ times a day. Thoracic extension and pectoral stretch  Roll a towel or a small blanket so it is about 4 inches (10 cm) in diameter. Lie down on your back on a firm surface. Put the towel in the middle of your back across your spine. It should not be under your shoulder blades. Put your hands behind your head and let your elbows fall out to your sides. Hold for __________ seconds. Repeat __________ times. Complete this exercise __________ times a day. Strengthening exercises Upper cervical flexion  Lie on your back with a thin pillow behind your head or a small, rolled-up towel under your neck. Gently tuck your chin toward your chest and nod your head down to look toward your feet. Do not lift your head off the pillow. Hold for __________ seconds. Release the tension slowly. Relax your neck muscles completely before you repeat this exercise. Repeat __________ times. Complete this exercise __________ times a  day. Cervical extension  Stand about 6 inches (15 cm) away from a wall, with your back facing the wall. Place a soft object, about 6-8 inches (15-20 cm) in diameter, between the back of your head and the wall. A soft object could be a small pillow, a ball, or a folded towel. Gently tilt your head back and press into the soft object. Keep your jaw and forehead relaxed. Hold for __________ seconds. Release the tension slowly. Relax your neck muscles completely before you repeat this exercise. Repeat __________ times. Complete this exercise __________ times a day. Posture and body mechanics Body mechanics refer to the movements and positions of your body while you do your daily activities. Posture is part of body mechanics. Good posture and healthy body mechanics can help to relieve stress in your body's tissues and joints. Good posture means that your spine is in its natural S-curve position (your spine is neutral), your shoulders are pulled back slightly, and your head is not tipped forward. The following are general guidelines for using improved posture and body mechanics in your everyday activities. Sitting  When sitting, keep your spine neutral and keep your feet flat on the floor. Use a footrest, if needed, and keep your thighs parallel to the floor. Avoid rounding your shoulders. Avoid tilting your head forward. When working at a desk or a computer, keep your desk at a height where your hands are slightly lower than your elbows. Slide your chair under your desk so you are close enough to maintain good posture. When working at a computer, place your monitor at a height where you are looking straight ahead and you do not have to tilt your head forward or downward to look at the screen. Standing  When standing, keep your spine neutral and keep your feet about hip-width apart. Keep a slight bend in your knees. Your ears, shoulders, and hips should line up. When you do a task in which you stand in  one place for a long time, place one foot up on a stable object that is 2-4 inches (5-10 cm) high, such as a footstool. This helps keep your spine neutral. Resting When lying down and resting, avoid positions that are most painful for you. Try to support your neck in a neutral position. You can use a contour pillow or a small rolled-up towel. Your pillow should support your neck but not push on it. This information is not intended to replace advice given to you by your health care provider. Make sure you discuss any questions you have with your health care provider. Document Revised: 03/31/2023  Document Reviewed: 06/17/2022 Elsevier Patient Education  2024 [employer] Inc[employer]  Standing Labs We placed an order today for your standing lab work.   Please have your standing labs drawn in December  Please have your labs drawn 2 weeks prior to your appointment so that the provider can discuss your lab results at your appointment, if possible.  Please note that you may see your imaging and lab results in MyChart before we have reviewed them. We will contact you once all results are reviewed. Please allow our office up to 72 hours to thoroughly review all of the results before contacting the office for clarification of your results.  WALK-IN LAB HOURS  Monday through Thursday from 8:00 am -12:30 pm and 1:00 pm-4:00 pm and Friday from 8:00 am-12:00 pm.  Patients with office visits requiring labs will be seen before walk-in labs.  You may encounter longer than normal wait times. Please allow additional time. Wait times may be shorter on  Monday and Thursday afternoons.  We do not book appointments for walk-in labs. We appreciate your patience and understanding with our staff.   Labs are drawn by [facility]. Please bring your co-pay at the time of your lab draw.  You may receive a bill from [facility] for your lab work.  Please note if you are on Hydroxychloroquine and and an order has been placed for a  Hydroxychloroquine level,  you will need to have it drawn 4 hours or more after your last dose.  If you wish to have your labs drawn at another location, please call the office 24 hours in advance so we can fax the orders.  The office is located at [address], [place], [place] [address]   If you have any questions regarding directions or hours of operation,  please call [phone].   As a reminder, please drink plenty of water prior to coming for your lab work. Thanks!

## 2024-05-17 DIAGNOSIS — Z01419 Encounter for gynecological examination (general) (routine) without abnormal findings: Secondary | ICD-10-CM | POA: Diagnosis not present

## 2024-05-19 ENCOUNTER — Other Ambulatory Visit: Payer: Self-pay

## 2024-05-19 ENCOUNTER — Other Ambulatory Visit

## 2024-05-19 DIAGNOSIS — G35 Multiple sclerosis: Secondary | ICD-10-CM

## 2024-05-20 ENCOUNTER — Ambulatory Visit: Payer: Self-pay | Admitting: Neurology

## 2024-05-20 LAB — IGG, IGA, IGM
IgA/Immunoglobulin A, Serum: 25 mg/dL — ABNORMAL LOW (ref 87–352)
IgG (Immunoglobin G), Serum: 723 mg/dL (ref 586–1602)
IgM (Immunoglobulin M), Srm: 36 mg/dL (ref 26–217)

## 2024-05-20 LAB — CBC WITH DIFFERENTIAL/PLATELET
Basophils Absolute: 0 10*3/uL (ref 0.0–0.2)
Basos: 1 %
EOS (ABSOLUTE): 0.1 10*3/uL (ref 0.0–0.4)
Eos: 3 %
Hematocrit: 41.4 % (ref 34.0–46.6)
Hemoglobin: 13.8 g/dL (ref 11.1–15.9)
Immature Grans (Abs): 0 10*3/uL (ref 0.0–0.1)
Immature Granulocytes: 0 %
Lymphocytes Absolute: 1.2 10*3/uL (ref 0.7–3.1)
Lymphs: 24 %
MCH: 30.5 pg (ref 26.6–33.0)
MCHC: 33.3 g/dL (ref 31.5–35.7)
MCV: 91 fL (ref 79–97)
Monocytes Absolute: 0.4 10*3/uL (ref 0.1–0.9)
Monocytes: 9 %
Neutrophils Absolute: 3.1 10*3/uL (ref 1.4–7.0)
Neutrophils: 63 %
Platelets: 245 10*3/uL (ref 150–450)
RBC: 4.53 x10E6/uL (ref 3.77–5.28)
RDW: 12.4 % (ref 11.7–15.4)
WBC: 4.8 10*3/uL (ref 3.4–10.8)

## 2024-05-20 LAB — HEPATITIS B CORE ANTIBODY, TOTAL: Hep B Core Total Ab: NEGATIVE

## 2024-05-20 LAB — HEPATITIS B SURFACE ANTIGEN: Hepatitis B Surface Ag: NEGATIVE

## 2024-05-26 DIAGNOSIS — G35 Multiple sclerosis: Secondary | ICD-10-CM | POA: Diagnosis not present

## 2024-06-03 ENCOUNTER — Ambulatory Visit (INDEPENDENT_AMBULATORY_CARE_PROVIDER_SITE_OTHER): Payer: Medicare Other | Admitting: Neurology

## 2024-06-03 ENCOUNTER — Encounter: Payer: Self-pay | Admitting: Neurology

## 2024-06-03 VITALS — BP 112/80 | HR 66 | Ht 66.0 in | Wt 162.0 lb

## 2024-06-03 DIAGNOSIS — R41840 Attention and concentration deficit: Secondary | ICD-10-CM

## 2024-06-03 DIAGNOSIS — Z79899 Other long term (current) drug therapy: Secondary | ICD-10-CM

## 2024-06-03 DIAGNOSIS — M4722 Other spondylosis with radiculopathy, cervical region: Secondary | ICD-10-CM | POA: Diagnosis not present

## 2024-06-03 DIAGNOSIS — E559 Vitamin D deficiency, unspecified: Secondary | ICD-10-CM | POA: Diagnosis not present

## 2024-06-03 DIAGNOSIS — G35 Multiple sclerosis: Secondary | ICD-10-CM | POA: Diagnosis not present

## 2024-06-03 DIAGNOSIS — F32A Depression, unspecified: Secondary | ICD-10-CM

## 2024-06-03 DIAGNOSIS — M3500 Sicca syndrome, unspecified: Secondary | ICD-10-CM | POA: Diagnosis not present

## 2024-06-03 DIAGNOSIS — M25511 Pain in right shoulder: Secondary | ICD-10-CM

## 2024-06-03 DIAGNOSIS — R918 Other nonspecific abnormal finding of lung field: Secondary | ICD-10-CM | POA: Insufficient documentation

## 2024-06-03 DIAGNOSIS — R260 Ataxic gait: Secondary | ICD-10-CM | POA: Diagnosis not present

## 2024-06-03 DIAGNOSIS — F09 Unspecified mental disorder due to known physiological condition: Secondary | ICD-10-CM

## 2024-06-03 MED ORDER — CYCLOBENZAPRINE HCL 5 MG PO TABS: 5.0000 mg | ORAL_TABLET | Freq: Three times a day (TID) | ORAL | 1 refills | Status: DC | PRN

## 2024-06-03 MED ORDER — METHYLPREDNISOLONE 4 MG PO TABS: ORAL_TABLET | ORAL | 0 refills | Status: DC
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
## 2024-06-04 ENCOUNTER — Other Ambulatory Visit: Payer: Self-pay | Admitting: Rheumatology

## 2024-06-04 DIAGNOSIS — M3509 Sicca syndrome with other organ involvement: Secondary | ICD-10-CM

## 2024-06-07 ENCOUNTER — Telehealth: Payer: Self-pay | Admitting: *Deleted

## 2024-06-07 ENCOUNTER — Telehealth: Payer: Self-pay | Admitting: Neurology

## 2024-06-07 NOTE — Telephone Encounter (Signed)
 Mylene shara: [id number] exp. 06/07/24-08/06/24, CHARON shara: [id number] exp. 06/07/24-07/06/24 sent to [facility] [phone]  She can also walk-in at [facility] for the x-ray that was ordered.

## 2024-06-07 NOTE — Telephone Encounter (Signed)
 VM box full, sent mychart msg asking pt to call to schedule f/u with Dr. Vear

## 2024-06-07 NOTE — Telephone Encounter (Signed)
 Pt last seen 06/03/24. Has no f/u scheduled. Please call pt to schedule 6 month f/u around 12/03/24. Thank you!

## 2024-06-16 DIAGNOSIS — H25813 Combined forms of age-related cataract, bilateral: Secondary | ICD-10-CM | POA: Diagnosis not present

## 2024-06-16 DIAGNOSIS — H04129 Dry eye syndrome of unspecified lacrimal gland: Secondary | ICD-10-CM | POA: Diagnosis not present

## 2024-06-16 DIAGNOSIS — H43813 Vitreous degeneration, bilateral: Secondary | ICD-10-CM | POA: Diagnosis not present

## 2024-06-17 ENCOUNTER — Telehealth: Payer: Self-pay | Admitting: Neurology

## 2024-06-17 NOTE — Telephone Encounter (Signed)
 We already faxed back this form. I called back and spoke w/ Andrea who transferred me to pharmacist, Delon. Aware we are declining. Pt already established with outpt services and has not requested change. If she requests change, we can transition at that point.  They actually did have form we sent, it was missed. Nothing further needed.

## 2024-06-17 NOTE — Telephone Encounter (Signed)
 Dilia from [facility] called to request to speak to nurse . Representative stated that  she was following up on Pt OCREVUS  Home Infusion request . Representative understands that pt refused at home services , however [employer] need authorization from MD to get Home Infusions denial   Callback # [phone]

## 2024-06-19 ENCOUNTER — Ambulatory Visit
Admission: RE | Admit: 2024-06-19 | Discharge: 2024-06-19 | Disposition: A | Discharge status: Home / Self Care | Source: Ambulatory Visit | Attending: Neurology | Admitting: Neurology

## 2024-06-19 DIAGNOSIS — G35 Multiple sclerosis: Secondary | ICD-10-CM

## 2024-06-19 DIAGNOSIS — M25511 Pain in right shoulder: Secondary | ICD-10-CM | POA: Diagnosis not present

## 2024-06-19 DIAGNOSIS — R260 Ataxic gait: Secondary | ICD-10-CM | POA: Diagnosis not present

## 2024-06-19 DIAGNOSIS — M4722 Other spondylosis with radiculopathy, cervical region: Secondary | ICD-10-CM

## 2024-06-21 ENCOUNTER — Ambulatory Visit: Payer: Self-pay | Admitting: Neurology

## 2024-06-23 NOTE — Telephone Encounter (Signed)
-----   Message from Perla Simpers sent at 06/23/2024 12:29 PM EDT -----

## 2024-06-23 NOTE — Telephone Encounter (Signed)
 Pt called to request call inregard to  getting MRI Results

## 2024-06-23 NOTE — Telephone Encounter (Signed)
 Called pt. Relayed results per Dr. Duncan note. She verbalized understanding. She will continue to monitor sx and call if she develops any new or worsening sx. She is going to try and go back to aerobics for exercise as tolerated.   Scheduled 6 month f/u for 12/06/24 at 1pm with Dr. Vear.

## 2024-07-23 NOTE — Telephone Encounter (Signed)
 error

## 2024-07-28 ENCOUNTER — Other Ambulatory Visit: Payer: Self-pay | Admitting: Neurology

## 2024-07-28 NOTE — Telephone Encounter (Signed)
 Last seen on 06/03/24 Follow up scheduled on 12/06/24

## 2024-09-23 ENCOUNTER — Other Ambulatory Visit: Payer: Self-pay | Admitting: Neurology

## 2024-09-23 NOTE — Telephone Encounter (Signed)
 Last seen on 06/03/25 Follow up scheduled 12/06/24

## 2024-10-27 NOTE — Progress Notes (Signed)
 Office Visit Note  Patient: Ashley Key             Date of Birth: 1966/12/05           MRN: [id number]             PCP: Kip Righter, MD Referring: Kip Righter, MD Visit Date: 11/10/2024 Occupation: Data Unavailable  Subjective:  Joint stiffness  History of Present Illness: Ashley Key is a 58 y.o. female with Sjogren's and osteoarthritis.  She returns today after her last visit in June 2025.  Patient states the dry mouth and dry eyes symptoms are manageable with over-the-counter products.  She continues to have some stiffness in her hands especially over the [facility] joints.  She denies any shortness of breath or lymphadenopathy.  She denies any history of palpitations.  She has some joint stiffness.    Activities of Daily Living:  Patient reports morning stiffness for 0 minutes.   Patient Reports nocturnal pain.  Difficulty dressing/grooming: Denies Difficulty climbing stairs: Denies Difficulty getting out of chair: Denies Difficulty using hands for taps, buttons, cutlery, and/or writing: Reports  Review of Systems  Constitutional:  Negative for fatigue.  HENT:  Negative for mouth sores and mouth dryness.   Eyes:  Negative for dryness.  Respiratory:  Negative for shortness of breath.   Cardiovascular:  Negative for chest pain and palpitations.  Gastrointestinal:  Negative for blood in stool, constipation and diarrhea.  Endocrine: Negative for increased urination.  Genitourinary:  Negative for involuntary urination.  Musculoskeletal:  Positive for joint pain and joint pain. Negative for gait problem, joint swelling, myalgias, muscle weakness, morning stiffness, muscle tenderness and myalgias.  Skin:  Negative for color change, rash, hair loss and sensitivity to sunlight.  Allergic/Immunologic: Positive for susceptible to infections.  Neurological:  Negative for dizziness and headaches.  Hematological:  Negative for swollen glands.  Psychiatric/Behavioral:  Positive for  sleep disturbance. Negative for depressed mood. The patient is not nervous/anxious.     PMFS History:  Patient Active Problem List   Diagnosis Date Noted   Lung field abnormal 06/03/2024   Opacity of lung on imaging study 06/03/2024   CAP vs BOOP 05/08/2023   Upper airway cough syndrome 05/07/2023   Depression 12/11/2021   Diplopia 08/10/2019   Mild neurocognitive disorder due to another medical condition 05/29/2018   Cognitive deficit secondary to multiple sclerosis 01/13/2018   Multiple sclerosis 10/23/2017   High risk medication use 10/23/2017   White matter abnormality on MRI of brain 10/15/2017   Numbness 10/15/2017   Memory loss 10/15/2017   Ataxic gait 10/15/2017   Attention deficit 10/15/2017   Herpes zoster without complication 01/03/2017   Chest pain, unspecified 01/18/2016   Screening, lipid 01/18/2016   Essential hypertension 01/18/2016   Hypothyroidism 01/18/2016   Idiopathic thrombocytopenic purpura (HCC) 06/27/2014   Thrombocytopenia 06/27/2014    Past Medical History:  Diagnosis Date   ADHD (attention deficit hyperactivity disorder)    Arch pain    Arthritis    FEET   Ataxic gait 10/15/2017   Attention deficit 10/15/2017   Blood dyscrasia    NORMAL PLATLETS 50-70'S   Bunion    Chest pain, unspecified 01/18/2016   Cognitive deficit secondary to multiple sclerosis 01/13/2018   Diplopia 08/10/2019   Essential hypertension 01/18/2016   Graves disease    Headache    HISTORY MIGRAINE   Hypertension    Hypothyroidism 01/18/2016   Idiopathic thrombocytopenic purpura (HCC) 06/27/2014   ITP (idiopathic  thrombocytopenic purpura)    Memory loss 10/15/2017   Multiple sclerosis 10/23/2017   Numbness 10/15/2017   Pelvic fracture (HCC)    Pneumonia    Screening, lipid 01/18/2016   Thrombocytopenia 06/27/2014   White matter abnormality on MRI of brain 10/15/2017    Family History  Problem Relation Age of Onset   CAD Mother    Heart attack Mother    Arthritis Mother     Stroke Father    Heart attack Father    Heart attack Maternal Grandmother    Stroke Maternal Grandmother    Stroke Paternal Grandmother    CAD Brother    Heart attack Brother    Cancer Brother        prostate cancer   BRCA 1/2 Neg Hx    Breast cancer Neg Hx    Past Surgical History:  Procedure Laterality Date   DILITATION & CURRETTAGE/HYSTROSCOPY WITH HYDROTHERMAL ABLATION N/A 09/27/2015   Procedure: DILATATION & CURETTAGE/HYSTEROSCOPY WITH HYDROTHERMAL ABLATION;  Surgeon: Hargis Paradise, MD;  Location: [facility];  Service: Gynecology;  Laterality: N/A;  ultrasound guidance    FOOT SURGERY Right    THYROID  RADATION 2016     WISDOM TOOTH EXTRACTION     Social History   Tobacco Use   Smoking status: Never    Passive exposure: Current (minimal)   Smokeless tobacco: Never  Vaping Use   Vaping status: Never Used  Substance Use Topics   Alcohol use: Yes    Comment: social   Drug use: No   Social History   Social History Narrative   Married, husband Market Researcher in [place]   Children- #1 son (10)   Pet cat and getting new dog today            Epworth Sleepiness Scale = 9 (as of 01/22/2016)     Immunization History  Administered Date(s) Administered   Influenza,inj,Quad PF,6+ Mos 11/10/2014, 10/16/2015, 08/10/2019   Influenza,inj,quad, With Preservative 08/22/2018     Objective: Vital Signs: BP 125/83   Pulse 94   Temp 97.8 F (36.6 C)   Resp 16   Ht 5' 6 (1.676 m)   Wt 167 lb 6.4 oz (75.9 kg)   BMI 27.02 kg/m    Physical Exam Vitals and nursing note reviewed.  Constitutional:      Appearance: She is well-developed.  HENT:     Head: Normocephalic and atraumatic.  Eyes:     Conjunctiva/sclera: Conjunctivae normal.  Cardiovascular:     Rate and Rhythm: Normal rate and regular rhythm.     Heart sounds: Normal heart sounds.  Pulmonary:     Effort: Pulmonary effort is normal.     Breath sounds: Normal breath sounds.  Abdominal:      General: Bowel sounds are normal.     Palpations: Abdomen is soft.  Musculoskeletal:     Cervical back: Normal range of motion.  Lymphadenopathy:     Cervical: No cervical adenopathy.  Skin:    General: Skin is warm and dry.     Capillary Refill: Capillary refill takes less than 2 seconds.  Neurological:     Mental Status: She is alert and oriented to person, place, and time.  Psychiatric:        Behavior: Behavior normal.      Musculoskeletal Exam: Cervical, thoracic and lumbar spine were in good range of motion.  There was no SI joint tenderness.  Shoulder joints, elbow joints, wrist joints, MCPs, PIPs and DIPs  were in good range of motion with no synovitis.  Bilateral CMC thickening was noted.  Hip joints and knee joints were in good range of motion without any warmth swelling or effusion.  There was no tenderness over ankles or MTPs.   CDAI Exam: CDAI Score: -- Patient Global: --; Provider Global: -- Swollen: --; Tender: -- Joint Exam 11/10/2024   No joint exam has been documented for this visit   There is currently no information documented on the homunculus. Go to the Rheumatology activity and complete the homunculus joint exam.  Investigation: No additional findings.  Imaging: No results found.  Recent Labs: Lab Results  Component Value Date   WBC 3.4 (L) 11/03/2024   HGB 14.0 11/03/2024   PLT 234 11/03/2024   NA 142 11/03/2024   K 4.5 11/03/2024   CL 107 11/03/2024   CO2 28 11/03/2024   GLUCOSE 90 11/03/2024   BUN 18 11/03/2024   CREATININE 0.87 11/03/2024   BILITOT 0.4 11/03/2024   ALKPHOS 96 07/03/2023   AST 15 11/03/2024   ALT 10 11/03/2024   PROT 6.3 11/03/2024   ALBUMIN 4.6 07/03/2023   CALCIUM  9.3 11/03/2024   GFRAA >60 04/14/2020   QFTBGOLDPLUS Negative 07/03/2023    Speciality Comments: No specialty comments available.  Procedures:  No procedures performed Allergies: Pilocarpine  hcl   Assessment / Plan:     Visit Diagnoses: Sjogren's  syndrome with other organ involvement - Positive ANA, positive SSA, low IgA, dry mouth and dry eyes: -November 03, 2024 CBC WBC 3.4, CMP normal, ANA 1: 80 NS, urine protein creatinine ratio normal, dsDNA negative, SSA negative, sed rate normal, complements normal,.  Labs were reviewed with the patient.  She continues to have mild dry mouth and dry eye symptoms which are manageable with over-the-counter products.  She denies any history of lymphadenopathy, shortness of breath or palpitations.  No lymphadenopathy was noted on the examination.  Her lungs were clear to auscultation.  Plan: Protein / creatinine ratio, urine, CBC with Differential/Platelet, Comprehensive metabolic panel with GFR, Anti-DNA antibody, double-stranded, C3 and C4, Sedimentation rate, ANA, Sjogrens syndrome-A extractable nuclear antibody, Serum protein electrophoresis with reflex.  She will get labs prior to her next appointment in 1 year.  I advised her to contact me if she develops any new symptoms.  Primary osteoarthritis of both hands -she continues to have discomfort in her hands especially over the [facility] joints.  Bilateral CMC thickening was noted.  Clinical graphic findings suggestive of osteoarthritis.  Primary osteoarthritis of both feet -she denies any discomfort in her feet.  She has bilateral hammertoes and pes cavus.  Neck stiffness-currently not symptomatic.  Hypermobility of joint -hypermobility was noted in her joints.   Other medical problems are listed as follows:  Multiple sclerosis - Diagnosed 5 years ago.  Patient is under care of Dr. 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   Follow-Up Instructions: Return in about 1 year (  around 11/10/2025) for Osteoarthritis, Sjogren's.   Maya Nash, MD  Note - This record has been created using Animal nutritionist.  Chart creation errors have been sought, but may not always  have been located. Such creation errors do not reflect on  the standard of medical care.

## 2024-11-01 ENCOUNTER — Telehealth: Payer: Self-pay | Admitting: Rheumatology

## 2024-11-01 NOTE — Telephone Encounter (Signed)
Patient requested a return call to let her know if she is due for labwork. ?

## 2024-11-01 NOTE — Telephone Encounter (Signed)
 Advised patient that she is due for labs and it would be good to have them drawn prior to her scheduled appt on 11/10/2024.Patient verbalized understanding. Provided the patient with lab hours and info. Standing orders are in place.

## 2024-11-03 ENCOUNTER — Other Ambulatory Visit: Payer: Self-pay | Admitting: *Deleted

## 2024-11-03 DIAGNOSIS — M3509 Sicca syndrome with other organ involvement: Secondary | ICD-10-CM

## 2024-11-05 ENCOUNTER — Ambulatory Visit: Payer: Self-pay | Admitting: Rheumatology

## 2024-11-05 NOTE — Progress Notes (Signed)
 Urine protein creatinine ratio normal, CBC normal, CMP normal, double-stranded DNA negative, complements normal, sed rate normal, SSA negative, ANA pending.  Labs do not indicate an autoimmune disease flare.

## 2024-11-06 LAB — CBC WITH DIFFERENTIAL/PLATELET
Absolute Lymphocytes: 1000 {cells}/uL (ref 850–3900)
Absolute Monocytes: 398 {cells}/uL (ref 200–950)
Basophils Absolute: 58 {cells}/uL (ref 0–200)
Basophils Relative: 1.7 %
Eosinophils Absolute: 139 {cells}/uL (ref 15–500)
Eosinophils Relative: 4.1 %
HCT: 43 % (ref 35.9–46.0)
Hemoglobin: 14 g/dL (ref 11.7–15.5)
MCH: 29.5 pg (ref 27.0–33.0)
MCHC: 32.6 g/dL (ref 31.6–35.4)
MCV: 90.5 fL (ref 81.4–101.7)
MPV: 8.6 fL (ref 7.5–12.5)
Monocytes Relative: 11.7 %
Neutro Abs: 1805 {cells}/uL (ref 1500–7800)
Neutrophils Relative %: 53.1 %
Platelets: 234 Thousand/uL (ref 140–400)
RBC: 4.75 Million/uL (ref 3.80–5.10)
RDW: 12 % (ref 11.0–15.0)
Total Lymphocyte: 29.4 %
WBC: 3.4 Thousand/uL — ABNORMAL LOW (ref 3.8–10.8)

## 2024-11-06 LAB — SJOGRENS SYNDROME-A EXTRACTABLE NUCLEAR ANTIBODY: SSA (Ro) (ENA) Antibody, IgG: <1 AI

## 2024-11-06 LAB — COMPREHENSIVE METABOLIC PANEL WITH GFR
AG Ratio: 2.3 (calc) (ref 1.0–2.5)
ALT: 10 U/L (ref 6–29)
AST: 15 U/L (ref 10–35)
Albumin: 4.4 g/dL (ref 3.6–5.1)
Alkaline phosphatase (APISO): 55 U/L (ref 37–153)
BUN: 18 mg/dL (ref 7–25)
CO2: 28 mmol/L (ref 20–32)
Calcium: 9.3 mg/dL (ref 8.6–10.4)
Chloride: 107 mmol/L (ref 98–110)
Creat: 0.87 mg/dL (ref 0.50–1.03)
Globulin: 1.9 g/dL (ref 1.9–3.7)
Glucose, Bld: 90 mg/dL (ref 65–99)
Potassium: 4.5 mmol/L (ref 3.5–5.3)
Sodium: 142 mmol/L (ref 135–146)
Total Bilirubin: 0.4 mg/dL (ref 0.2–1.2)
Total Protein: 6.3 g/dL (ref 6.1–8.1)
eGFR: 77 mL/min/1.73m2 (ref >=60)

## 2024-11-06 LAB — ANTI-DNA ANTIBODY, DOUBLE-STRANDED: ds DNA Ab: <1 [IU]/mL

## 2024-11-06 LAB — C3 AND C4
C3 Complement: 121 mg/dL (ref 83–193)
C4 Complement: 29 mg/dL (ref 15–57)

## 2024-11-06 LAB — ANA: Anti Nuclear Antibody (ANA): POSITIVE — AB

## 2024-11-06 LAB — ANTI-NUCLEAR AB-TITER (ANA TITER): ANA Titer 1: 1:80 {titer} — ABNORMAL HIGH

## 2024-11-06 LAB — SEDIMENTATION RATE: Sed Rate: 2 mm/h (ref 0–30)

## 2024-11-06 LAB — PROTEIN / CREATININE RATIO, URINE
Creatinine, Urine: 108 mg/dL (ref 20–275)
Protein/Creat Ratio: 56 mg/g{creat} (ref 24–184)
Protein/Creatinine Ratio: 0.056 mg/mg{creat} (ref 0.024–0.184)
Total Protein, Urine: 6 mg/dL (ref 5–24)

## 2024-11-07 NOTE — Progress Notes (Signed)
 ANA is low titer positive and stable.  White cell count is low.  All other labs are within normal limits.  I will discuss results at the follow-up visit.

## 2024-11-08 DIAGNOSIS — R051 Acute cough: Secondary | ICD-10-CM | POA: Diagnosis not present

## 2024-11-08 DIAGNOSIS — B349 Viral infection, unspecified: Secondary | ICD-10-CM | POA: Diagnosis not present

## 2024-11-08 DIAGNOSIS — R0981 Nasal congestion: Secondary | ICD-10-CM | POA: Diagnosis not present

## 2024-11-10 ENCOUNTER — Ambulatory Visit: Attending: Rheumatology | Admitting: Rheumatology

## 2024-11-10 ENCOUNTER — Encounter: Payer: Self-pay | Admitting: Rheumatology

## 2024-11-10 VITALS — BP 125/83 | HR 94 | Temp 97.8°F | Resp 16 | Ht 66.0 in | Wt 167.4 lb

## 2024-11-10 DIAGNOSIS — M436 Torticollis: Secondary | ICD-10-CM | POA: Diagnosis not present

## 2024-11-10 DIAGNOSIS — F09 Unspecified mental disorder due to known physiological condition: Secondary | ICD-10-CM

## 2024-11-10 DIAGNOSIS — M19072 Primary osteoarthritis, left ankle and foot: Secondary | ICD-10-CM

## 2024-11-10 DIAGNOSIS — R41840 Attention and concentration deficit: Secondary | ICD-10-CM

## 2024-11-10 DIAGNOSIS — E890 Postprocedural hypothyroidism: Secondary | ICD-10-CM

## 2024-11-10 DIAGNOSIS — F439 Reaction to severe stress, unspecified: Secondary | ICD-10-CM

## 2024-11-10 DIAGNOSIS — M19071 Primary osteoarthritis, right ankle and foot: Secondary | ICD-10-CM

## 2024-11-10 DIAGNOSIS — I10 Essential (primary) hypertension: Secondary | ICD-10-CM

## 2024-11-10 DIAGNOSIS — R260 Ataxic gait: Secondary | ICD-10-CM

## 2024-11-10 DIAGNOSIS — M249 Joint derangement, unspecified: Secondary | ICD-10-CM

## 2024-11-10 DIAGNOSIS — M19041 Primary osteoarthritis, right hand: Secondary | ICD-10-CM

## 2024-11-10 DIAGNOSIS — D693 Immune thrombocytopenic purpura: Secondary | ICD-10-CM

## 2024-11-10 DIAGNOSIS — Z8659 Personal history of other mental and behavioral disorders: Secondary | ICD-10-CM

## 2024-11-10 DIAGNOSIS — M3509 Sicca syndrome with other organ involvement: Secondary | ICD-10-CM | POA: Diagnosis not present

## 2024-11-10 DIAGNOSIS — G35D Multiple sclerosis, unspecified: Secondary | ICD-10-CM

## 2024-11-10 DIAGNOSIS — M19042 Primary osteoarthritis, left hand: Secondary | ICD-10-CM

## 2024-11-10 NOTE — Patient Instructions (Signed)

## 2024-11-21 ENCOUNTER — Other Ambulatory Visit: Payer: Self-pay | Admitting: Neurology

## 2024-11-25 NOTE — Telephone Encounter (Signed)
 Last see on 06/03/24 Follow up scheduled on 11/26/24

## 2024-12-06 ENCOUNTER — Ambulatory Visit (INDEPENDENT_AMBULATORY_CARE_PROVIDER_SITE_OTHER): Admitting: Neurology

## 2024-12-06 ENCOUNTER — Encounter: Payer: Self-pay | Admitting: Neurology

## 2024-12-06 VITALS — BP 122/80 | HR 102 | Ht 66.0 in | Wt 169.0 lb

## 2024-12-06 DIAGNOSIS — M3500 Sicca syndrome, unspecified: Secondary | ICD-10-CM | POA: Diagnosis not present

## 2024-12-06 DIAGNOSIS — Z79899 Other long term (current) drug therapy: Secondary | ICD-10-CM

## 2024-12-06 DIAGNOSIS — R41840 Attention and concentration deficit: Secondary | ICD-10-CM | POA: Diagnosis not present

## 2024-12-06 DIAGNOSIS — F32A Depression, unspecified: Secondary | ICD-10-CM

## 2024-12-06 DIAGNOSIS — F09 Unspecified mental disorder due to known physiological condition: Secondary | ICD-10-CM | POA: Diagnosis not present

## 2024-12-06 DIAGNOSIS — R260 Ataxic gait: Secondary | ICD-10-CM

## 2024-12-06 DIAGNOSIS — G35D Multiple sclerosis, unspecified: Secondary | ICD-10-CM | POA: Diagnosis not present

## 2024-12-06 MED ORDER — CYCLOBENZAPRINE HCL 5 MG PO TABS: 5.0000 mg | ORAL_TABLET | Freq: Three times a day (TID) | ORAL | 1 refills | Status: DC | PRN
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
## 2025-01-01 ENCOUNTER — Other Ambulatory Visit: Payer: Self-pay | Admitting: Neurology

## 2025-01-04 NOTE — Telephone Encounter (Signed)
 Last seen on 12/06/24 Follow up scheduled on 05/11/25

## 2025-05-11 ENCOUNTER — Ambulatory Visit: Admitting: Neurology

## 2025-11-08 ENCOUNTER — Ambulatory Visit: Admitting: Rheumatology
